# Patient Record
Sex: Male | Born: 1955 | Race: White | Hispanic: No | Marital: Married | Smoking: Never smoker
Health system: Southern US, Community
[De-identification: ages and names within clinical notes are randomized; demographics above are authoritative.]

## PROBLEM LIST (undated history)

## (undated) DIAGNOSIS — J189 Pneumonia, unspecified organism: Secondary | ICD-10-CM

## (undated) DIAGNOSIS — Z8719 Personal history of other diseases of the digestive system: Secondary | ICD-10-CM

## (undated) DIAGNOSIS — I10 Essential (primary) hypertension: Secondary | ICD-10-CM

## (undated) DIAGNOSIS — K219 Gastro-esophageal reflux disease without esophagitis: Secondary | ICD-10-CM

## (undated) DIAGNOSIS — T7840XA Allergy, unspecified, initial encounter: Secondary | ICD-10-CM

## (undated) DIAGNOSIS — K449 Diaphragmatic hernia without obstruction or gangrene: Secondary | ICD-10-CM

## (undated) DIAGNOSIS — M1990 Unspecified osteoarthritis, unspecified site: Secondary | ICD-10-CM

## (undated) DIAGNOSIS — Z9889 Other specified postprocedural states: Secondary | ICD-10-CM

## (undated) DIAGNOSIS — K589 Irritable bowel syndrome without diarrhea: Secondary | ICD-10-CM

## (undated) HISTORY — PX: SPINE SURGERY: SHX786

## (undated) HISTORY — PX: LAPAROSCOPIC CHOLECYSTECTOMY: SUR755

## (undated) HISTORY — DX: Allergy, unspecified, initial encounter: T78.40XA

## (undated) HISTORY — DX: Irritable bowel syndrome, unspecified: K58.9

## (undated) HISTORY — DX: Other specified postprocedural states: Z98.890

## (undated) HISTORY — DX: Diaphragmatic hernia without obstruction or gangrene: K44.9

## (undated) HISTORY — DX: Personal history of other diseases of the digestive system: Z87.19

---
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
## 1998-04-24 ENCOUNTER — Encounter: Admission: RE | Admit: 1998-04-24 | Discharge: 1998-07-23 | Payer: Self-pay | Admitting: Family Medicine

## 1998-04-29 ENCOUNTER — Ambulatory Visit (HOSPITAL_COMMUNITY): Admission: RE | Admit: 1998-04-29 | Discharge: 1998-04-29 | Payer: Self-pay

## 1998-09-20 ENCOUNTER — Emergency Department (HOSPITAL_COMMUNITY): Admission: EM | Admit: 1998-09-20 | Discharge: 1998-09-21 | Payer: Self-pay | Admitting: Emergency Medicine

## 1999-10-14 ENCOUNTER — Emergency Department (HOSPITAL_COMMUNITY): Admission: EM | Admit: 1999-10-14 | Discharge: 1999-10-14 | Payer: Self-pay | Admitting: Emergency Medicine

## 2001-05-19 ENCOUNTER — Emergency Department (HOSPITAL_COMMUNITY): Admission: EM | Admit: 2001-05-19 | Discharge: 2001-05-19 | Payer: Self-pay | Admitting: Emergency Medicine

## 2008-04-18 ENCOUNTER — Emergency Department (HOSPITAL_COMMUNITY): Admission: EM | Admit: 2008-04-18 | Discharge: 2008-04-18 | Payer: Self-pay | Admitting: *Deleted

## 2008-04-20 ENCOUNTER — Emergency Department (HOSPITAL_COMMUNITY): Admission: EM | Admit: 2008-04-20 | Discharge: 2008-04-20 | Payer: Self-pay | Admitting: Emergency Medicine

## 2008-05-08 ENCOUNTER — Ambulatory Visit: Payer: Self-pay | Admitting: Pulmonary Disease

## 2008-05-08 DIAGNOSIS — R05 Cough: Secondary | ICD-10-CM | POA: Insufficient documentation

## 2008-05-08 DIAGNOSIS — J302 Other seasonal allergic rhinitis: Secondary | ICD-10-CM

## 2008-05-08 DIAGNOSIS — J3089 Other allergic rhinitis: Secondary | ICD-10-CM | POA: Insufficient documentation

## 2008-06-02 ENCOUNTER — Ambulatory Visit: Payer: Self-pay | Admitting: Pulmonary Disease

## 2008-06-02 DIAGNOSIS — K219 Gastro-esophageal reflux disease without esophagitis: Secondary | ICD-10-CM

## 2008-06-03 ENCOUNTER — Telehealth: Payer: Self-pay | Admitting: Pulmonary Disease

## 2008-06-04 ENCOUNTER — Emergency Department (HOSPITAL_COMMUNITY): Admission: EM | Admit: 2008-06-04 | Discharge: 2008-06-04 | Payer: Self-pay | Admitting: Emergency Medicine

## 2008-06-19 ENCOUNTER — Ambulatory Visit: Payer: Self-pay | Admitting: Pulmonary Disease

## 2008-06-19 ENCOUNTER — Telehealth: Payer: Self-pay | Admitting: Pulmonary Disease

## 2008-06-23 ENCOUNTER — Encounter: Payer: Self-pay | Admitting: Pulmonary Disease

## 2008-06-23 ENCOUNTER — Ambulatory Visit: Payer: Self-pay | Admitting: Cardiovascular Disease

## 2009-05-22 IMAGING — CR DG CHEST 2V
2 series · 2 of 2 positions shown · non-contrast
Comparison: 04/20/2008

CLINICAL DATA: Cough.  Short of breath.

CHEST - 2 VIEW

[w chest pa]
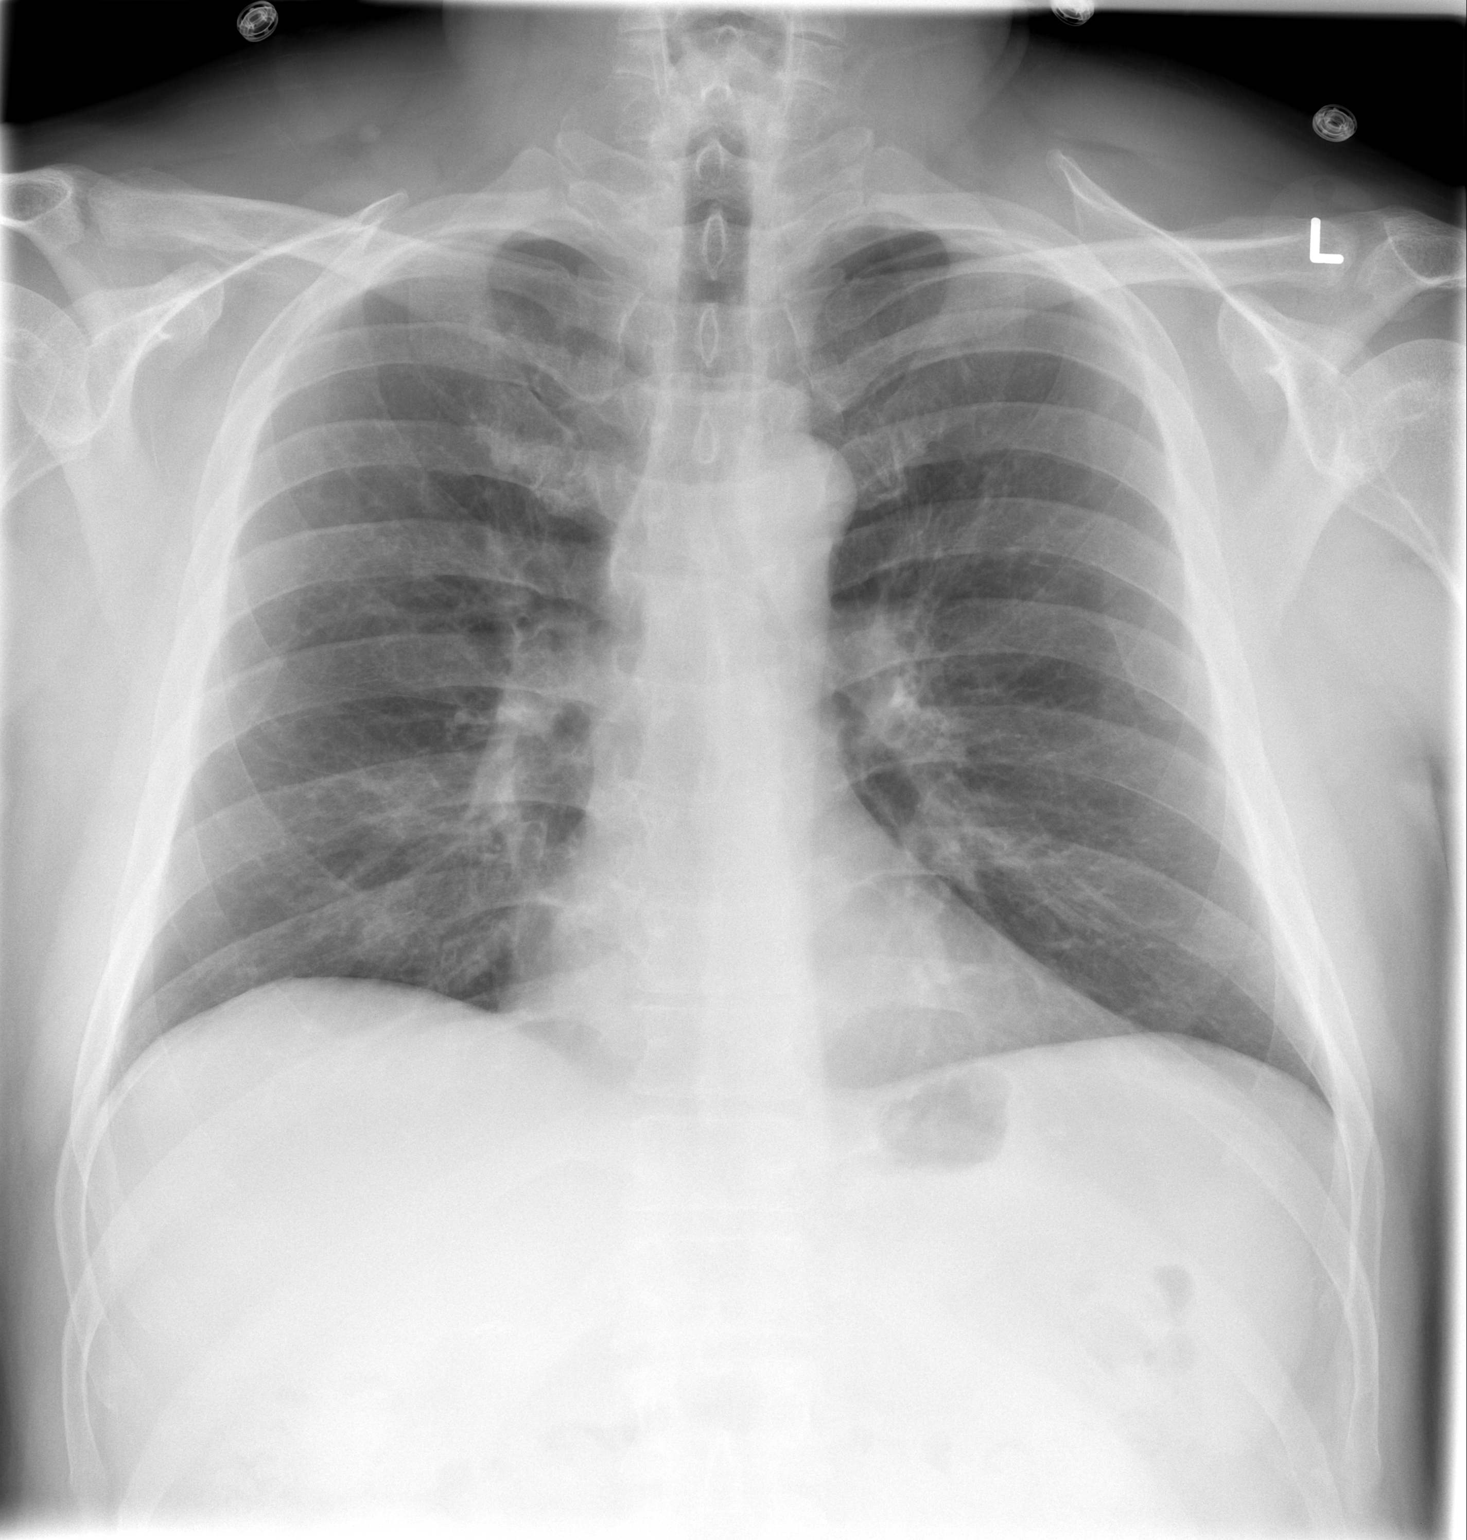

[w chest lat]
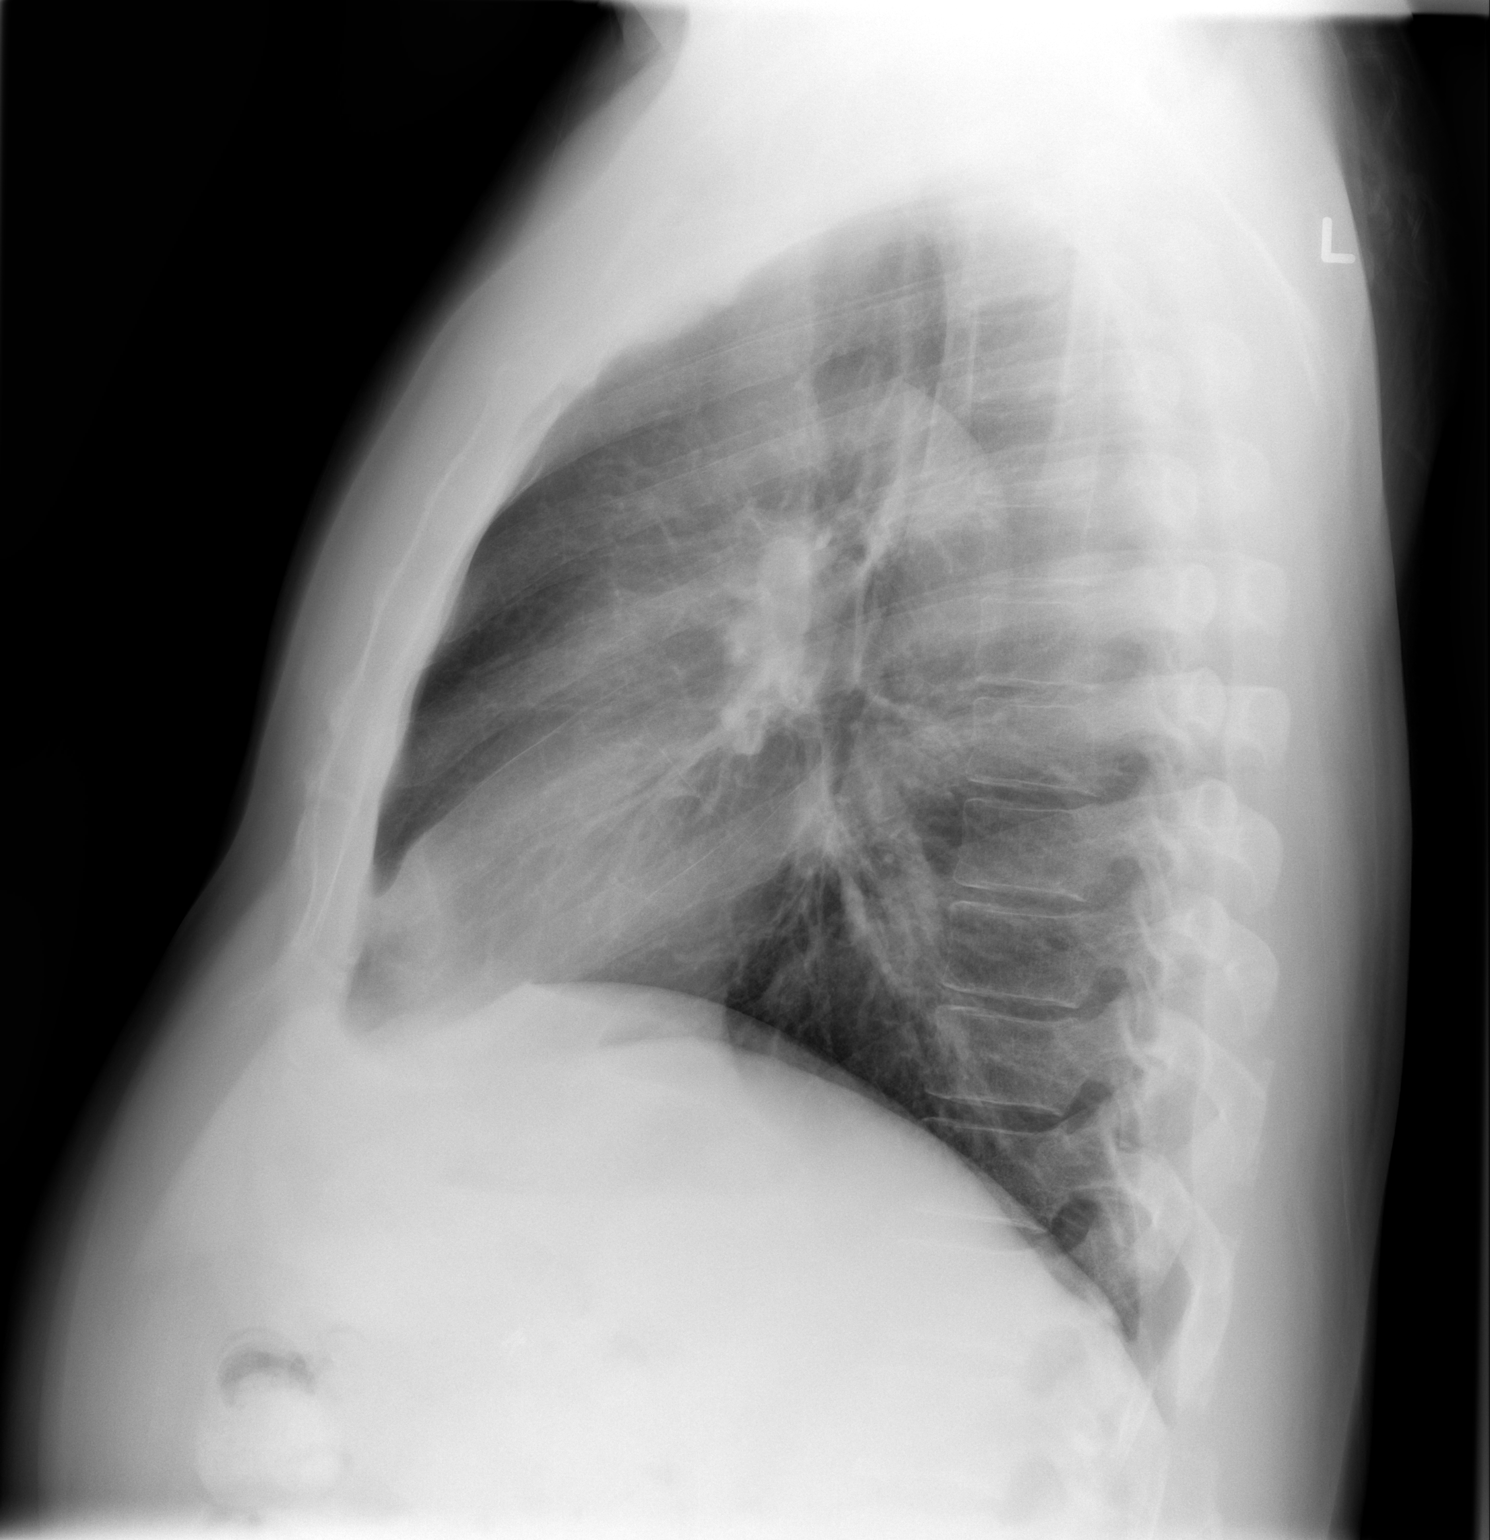

[2 of 2 positions shown; findings below may reference images not displayed]

FINDINGS: Mild right basilar atelectasis or scarring.  The
cardiopericardial silhouette appears within normal limits.
Mediastinal contours are unremarkable.  Trachea midline.  No
airspace disease or effusion.  Compared to the prior chest
radiograph, no interval change.
IMPRESSION: No acute cardiopulmonary disease.  Unchanged appearance of the
chest.

## 2010-05-27 LAB — POCT I-STAT, CHEM 8
BUN: 9 mg/dL (ref 6–23)
Chloride: 105 mEq/L (ref 96–112)
Creatinine, Ser: 1.2 mg/dL (ref 0.4–1.5)
Glucose, Bld: 121 mg/dL — ABNORMAL HIGH (ref 70–99)
Potassium: 4.1 mEq/L (ref 3.5–5.1)

## 2010-07-22 ENCOUNTER — Emergency Department (HOSPITAL_COMMUNITY)
Admission: EM | Admit: 2010-07-22 | Discharge: 2010-07-22 | Disposition: A | Discharge status: Home / Self Care | Payer: BC Managed Care – PPO | Attending: Emergency Medicine | Admitting: Emergency Medicine

## 2010-07-22 ENCOUNTER — Emergency Department (HOSPITAL_COMMUNITY): Payer: BC Managed Care – PPO

## 2010-07-22 DIAGNOSIS — R1110 Vomiting, unspecified: Secondary | ICD-10-CM | POA: Insufficient documentation

## 2010-07-22 DIAGNOSIS — R071 Chest pain on breathing: Secondary | ICD-10-CM | POA: Insufficient documentation

## 2010-07-22 DIAGNOSIS — M549 Dorsalgia, unspecified: Secondary | ICD-10-CM | POA: Insufficient documentation

## 2010-07-22 DIAGNOSIS — M542 Cervicalgia: Secondary | ICD-10-CM | POA: Insufficient documentation

## 2010-07-22 DIAGNOSIS — K219 Gastro-esophageal reflux disease without esophagitis: Secondary | ICD-10-CM | POA: Insufficient documentation

## 2010-07-22 DIAGNOSIS — R079 Chest pain, unspecified: Secondary | ICD-10-CM | POA: Insufficient documentation

## 2010-07-22 DIAGNOSIS — M79609 Pain in unspecified limb: Secondary | ICD-10-CM | POA: Insufficient documentation

## 2010-07-22 LAB — COMPREHENSIVE METABOLIC PANEL
AST: 26 U/L (ref 0–37)
Albumin: 4.4 g/dL (ref 3.5–5.2)
Chloride: 101 mEq/L (ref 96–112)
Creatinine, Ser: 1.21 mg/dL (ref 0.4–1.5)
GFR calc Af Amer: >60 mL/min (ref >60)
Potassium: 3.7 mEq/L (ref 3.5–5.1)
Total Bilirubin: 0.5 mg/dL (ref 0.3–1.2)

## 2010-07-22 LAB — CBC
HCT: 46.8 % (ref 39.0–52.0)
Hemoglobin: 16.9 g/dL (ref 13.0–17.0)
MCHC: 36.1 g/dL — ABNORMAL HIGH (ref 30.0–36.0)
MCV: 86 fL (ref 78.0–100.0)
RDW: 12.4 % (ref 11.5–15.5)

## 2010-07-22 LAB — DIFFERENTIAL
Eosinophils Relative: 4 % (ref 0–5)
Lymphocytes Relative: 35 % (ref 12–46)
Lymphs Abs: 2.8 10*3/uL (ref 0.7–4.0)
Monocytes Absolute: 0.9 10*3/uL (ref 0.1–1.0)
Monocytes Relative: 11 % (ref 3–12)

## 2010-07-22 LAB — TROPONIN I: Troponin I: <0.3 ng/mL (ref <0.30)

## 2011-04-19 ENCOUNTER — Emergency Department (HOSPITAL_COMMUNITY)
Admission: EM | Admit: 2011-04-19 | Discharge: 2011-04-19 | Disposition: A | Discharge status: Home / Self Care | Payer: BC Managed Care – PPO | Attending: Emergency Medicine | Admitting: Emergency Medicine

## 2011-04-19 ENCOUNTER — Emergency Department (HOSPITAL_COMMUNITY): Payer: BC Managed Care – PPO

## 2011-04-19 ENCOUNTER — Encounter (HOSPITAL_COMMUNITY): Payer: Self-pay | Admitting: *Deleted

## 2011-04-19 DIAGNOSIS — R110 Nausea: Secondary | ICD-10-CM | POA: Insufficient documentation

## 2011-04-19 DIAGNOSIS — H8110 Benign paroxysmal vertigo, unspecified ear: Secondary | ICD-10-CM

## 2011-04-19 DIAGNOSIS — R42 Dizziness and giddiness: Secondary | ICD-10-CM | POA: Insufficient documentation

## 2011-04-19 DIAGNOSIS — K219 Gastro-esophageal reflux disease without esophagitis: Secondary | ICD-10-CM | POA: Insufficient documentation

## 2011-04-19 DIAGNOSIS — R51 Headache: Secondary | ICD-10-CM

## 2011-04-19 DIAGNOSIS — I10 Essential (primary) hypertension: Secondary | ICD-10-CM | POA: Insufficient documentation

## 2011-04-19 HISTORY — DX: Essential (primary) hypertension: I10

## 2011-04-19 HISTORY — DX: Gastro-esophageal reflux disease without esophagitis: K21.9

## 2011-04-19 LAB — BASIC METABOLIC PANEL
CO2: 26 mEq/L (ref 19–32)
Calcium: 10 mg/dL (ref 8.4–10.5)
Creatinine, Ser: 1.12 mg/dL (ref 0.50–1.35)
Glucose, Bld: 88 mg/dL (ref 70–99)

## 2011-04-19 LAB — CBC
MCH: 31.1 pg (ref 26.0–34.0)
MCV: 86.1 fL (ref 78.0–100.0)
Platelets: 264 10*3/uL (ref 150–400)
RBC: 5.7 MIL/uL (ref 4.22–5.81)

## 2011-04-19 MED ORDER — SODIUM CHLORIDE 0.9 % IV BOLUS (SEPSIS)
1000.0000 mL | Freq: Once | INTRAVENOUS | Status: AC
  Administered 2011-04-19: 1000 mL via INTRAVENOUS

## 2011-04-19 MED ORDER — MECLIZINE HCL 25 MG PO TABS
25.0000 mg | ORAL_TABLET | Freq: Once | ORAL | Status: AC
  Administered 2011-04-19: 25 mg via ORAL
  Filled 2011-04-19: qty 1

## 2011-04-19 MED ORDER — KETOROLAC TROMETHAMINE 30 MG/ML IJ SOLN
30.0000 mg | Freq: Once | INTRAMUSCULAR | Status: AC
Start: 2011-04-19 — End: 2011-04-19
  Administered 2011-04-19: 30 mg via INTRAVENOUS
  Filled 2011-04-19: qty 1

## 2011-04-19 MED ORDER — DEXAMETHASONE SODIUM PHOSPHATE 10 MG/ML IJ SOLN
10.0000 mg | Freq: Once | INTRAMUSCULAR | Status: AC
  Administered 2011-04-19: 10 mg via INTRAVENOUS
  Filled 2011-04-19: qty 1

## 2011-04-19 MED ORDER — DIPHENHYDRAMINE HCL 50 MG/ML IJ SOLN
25.0000 mg | Freq: Once | INTRAMUSCULAR | Status: AC
  Administered 2011-04-19: 25 mg via INTRAVENOUS
  Filled 2011-04-19: qty 1

## 2011-04-19 MED ORDER — MECLIZINE HCL 50 MG PO TABS: 50.0000 mg | ORAL_TABLET | Freq: Three times a day (TID) | ORAL | Status: AC | PRN

## 2011-04-19 MED ORDER — METOCLOPRAMIDE HCL 5 MG/ML IJ SOLN
10.0000 mg | Freq: Once | INTRAMUSCULAR | Status: AC
  Administered 2011-04-19: 10 mg via INTRAVENOUS
  Filled 2011-04-19: qty 2

## 2011-04-19 NOTE — ED Notes (Signed)
Pt reports intermittent episodes of dizziness, states especially with driving. Pt reports intermittent headaches and nausea. Pt reports in the last 3 days the headaches and nausea have gotten worse. Stroke scale negative. cbg 94 at urgent care. Pt sent over for further eval.

## 2011-04-19 NOTE — ED Provider Notes (Signed)
History     CSN: [id number]  Arrival date & time 04/19/11  1240   First MD Initiated Contact with Patient 04/19/11 1611      Chief Complaint  Patient presents with  . Dizziness    (Consider location/radiation/quality/duration/timing/severity/associated sxs/prior treatment) HPI Comments: Patient presents today complaining of some intermittent dizziness and headache.  Patient notes that over the last 3-4 weeks he's had intermittent episodes of dizziness which at times he can described as a spinning sensation but occasionally he feels like he might pass out although he has not had any episodes where he's lost consciousness.  He does note that he specifically has noted these episodes when he's driving in the car when he has to turn his head to look at another lane.  He has not noted any fevers.  Over the last 3-4 days he's noted a dull achy headache in the right posterior aspect of his head coming along the temporal region.  He has not had a past history of migraines.  He's been taking Tylenol for the headache.  Last night he had poor sleep and similar symptoms with some associated nausea but did not come in.  Today he went to the pharmacy to get Tylenol and checked his blood pressure that was mildly elevated.  He does not currently take blood pressure medications but hasn't passed.  He called his doctor and he recommended he go to the urgent care.  He was seen at urgent care and instructed to come here for further evaluation.  Patient does not know changes in the headache with change of positions.  The headache is not worse in the morning.  Patient does have some concern because he has had 2 friends diagnosed with brain cancers in the last year.  Patient is a 56 y.o. male presenting with headaches. The history is provided by the patient. No language interpreter was used.  Headache  This is a new problem. The current episode started more than 2 days ago. The problem occurs constantly. The problem has  not changed since onset.The headache is associated with nothing. The pain is located in the right unilateral region. The quality of the pain is described as dull. The pain is moderate. Associated symptoms include nausea. Pertinent negatives include no anorexia, no fever, no malaise/fatigue, no chest pressure, no near-syncope, no orthopnea, no palpitations, no syncope, no shortness of breath and no vomiting.    Past Medical History  Diagnosis Date  . Hypertension   . GERD (gastroesophageal reflux disease)     Past Surgical History  Procedure Date  . Cholecystectomy     History reviewed. No pertinent family history.  History  Substance Use Topics  . Smoking status: Never Smoker   . Smokeless tobacco: Not on file  . Alcohol Use: Yes      Review of Systems  Constitutional: Negative.  Negative for fever, chills and malaise/fatigue.  Eyes: Negative.  Negative for discharge and redness.  Respiratory: Negative.  Negative for cough and shortness of breath.   Cardiovascular: Negative.  Negative for chest pain, palpitations, orthopnea, syncope and near-syncope.  Gastrointestinal: Positive for nausea. Negative for vomiting, abdominal pain and anorexia.  Genitourinary: Negative.  Negative for hematuria.  Musculoskeletal: Negative.  Negative for back pain and gait problem.  Skin: Negative.  Negative for color change and rash.  Neurological: Positive for dizziness, light-headedness and headaches. Negative for tremors, syncope, facial asymmetry, speech difficulty, weakness and numbness.  Hematological: Negative.  Negative for adenopathy.  Psychiatric/Behavioral: Negative.  Negative for confusion.  All other systems reviewed and are negative.    Allergies  Review of patient's allergies indicates no known allergies.  Home Medications   Current Outpatient Rx  Name Route Sig Dispense Refill  . ADULT MULTIVITAMIN W/MINERALS CH Oral Take 1 tablet by mouth daily.    .  OXYCODONE-ACETAMINOPHEN 5-325 MG PO TABS Oral Take 1 tablet by mouth every 8 (eight) hours as needed. For pain      BP 141/103  Pulse 87  Temp(Src) 98.4 F (36.9 C) (Oral)  Resp 18  SpO2 98%  Physical Exam  Nursing note and vitals reviewed. Constitutional: He is oriented to person, place, and time. He appears well-developed and well-nourished.  Non-toxic appearance. He does not have a sickly appearance.  HENT:  Head: Normocephalic and atraumatic.  Eyes: Conjunctivae, EOM and lids are normal. Pupils are equal, round, and reactive to light.  Neck: Trachea normal, normal range of motion and full passive range of motion without pain. Neck supple.  Cardiovascular: Normal rate, regular rhythm and normal heart sounds.  Exam reveals no gallop and no friction rub.   No murmur heard. Pulmonary/Chest: Effort normal and breath sounds normal. No respiratory distress. He has no wheezes. He has no rales.  Abdominal: Soft. Normal appearance. He exhibits no distension. There is no tenderness. There is no rebound and no CVA tenderness.  Musculoskeletal: Normal range of motion.  Neurological: He is alert and oriented to person, place, and time. He has normal strength.       Cranial nerves II through XII are intact.  Face is symmetric.  Speech is clear.  Tongue is midline.  Visual fields are intact.  Extraocular eye movements are intact.  Patient has normal finger to nose testing and heel-to-shin testing bilaterally.  Normal gait.  Symmetric strength in his upper extremities and lower extremities.  Sensation to light touch is intact.  No pronator drift on exam.  Skin: Skin is warm, dry and intact. No rash noted.  Psychiatric: He has a normal mood and affect. His behavior is normal. Judgment and thought content normal.    ED Course  Procedures (including critical care time)  Results for orders placed during the hospital encounter of 04/19/11  CBC      Component Value Range   WBC 11.2 (*) 4.0 - 10.5  (K/uL)   RBC 5.70  4.22 - 5.81 (MIL/uL)   Hemoglobin 17.7 (*) 13.0 - 17.0 (g/dL)   HCT 16.1  09.6 - 04.5 (%)   MCV 86.1  78.0 - 100.0 (fL)   MCH 31.1  26.0 - 34.0 (pg)   MCHC 36.0  30.0 - 36.0 (g/dL)   RDW 40.9  81.1 - 91.4 (%)   Platelets 264  150 - 400 (K/uL)  BASIC METABOLIC PANEL      Component Value Range   Sodium 139  135 - 145 (mEq/L)   Potassium 4.1  3.5 - 5.1 (mEq/L)   Chloride 103  96 - 112 (mEq/L)   CO2 26  19 - 32 (mEq/L)   Glucose, Bld 88  70 - 99 (mg/dL)   BUN 12  6 - 23 (mg/dL)   Creatinine, Ser 7.82  0.50 - 1.35 (mg/dL)   Calcium 95.6  8.4 - 10.5 (mg/dL)   GFR calc non Af Amer 73 (*) >90 (mL/min)   GFR calc Af Amer 85 (*) >90 (mL/min)   Ct Head Wo Contrast  04/19/2011  *RADIOLOGY REPORT*  Clinical Data: Dizziness, headache, and nausea.  CT HEAD WITHOUT CONTRAST  Technique:  Contiguous axial images were obtained from the base of the skull through the vertex without contrast.  Comparison: None.  Findings: There is no acute intracranial hemorrhage, infarction, or mass lesion.  Brain parenchyma is normal.  No osseous abnormality. Scalp calcification high over the left parietal bone.  IMPRESSION: No significant abnormalities.  Original Report Authenticated By: Gwynn Burly, M.D.      MDM  Patient with normal neurologic exam at this time.  Given the changes in his dizziness with head turning this appears to be a positional vertigo sensation at this time.  Patient has no specific risk factors to suggest that this would be a posterior circulation insufficiency.  Patient has a normal head CT at this time and does not have signs of brain mass or other abnormal bleeding.  Patient's symptoms of headache or not consistent with sudden onset to suggest subarachnoid hemorrhage.  He has not had fever or other infectious symptoms to suggest meningitis at this time.  I will attempt to treat the patient's headache with pain medications here and send him home with meclizine for the  vertigo.  I will have him followup with his primary care physician or an ear nose and throat specialist for further evaluation of this process.        Nat Christen, MD 04/19/11 [phone]

## 2011-04-19 NOTE — Discharge Instructions (Signed)
Benign Positional Vertigo Vertigo means you feel like you or your surroundings are moving when they are not. Benign positional vertigo is the most common form of vertigo. Benign means that the cause of your condition is not serious. Benign positional vertigo is more common in older adults. CAUSES  Benign positional vertigo is the result of an upset in the labyrinth system. This is an area in the middle ear that helps control your balance. This may be caused by a viral infection, head injury, or repetitive motion. However, often no specific cause is found. SYMPTOMS  Symptoms of benign positional vertigo occur when you move your head or eyes in different directions. Some of the symptoms may include:  Loss of balance and falls.   Vomiting.   Blurred vision.   Dizziness.   Nausea.   Involuntary eye movements (nystagmus).  DIAGNOSIS  Benign positional vertigo is usually diagnosed by physical exam. If the specific cause of your benign positional vertigo is unknown, your caregiver may perform imaging tests, such as magnetic resonance imaging (MRI) or computed tomography (CT). TREATMENT  Your caregiver may recommend movements or procedures to correct the benign positional vertigo. Medicines such as meclizine, benzodiazepines, and medicines for nausea may be used to treat your symptoms. In rare cases, if your symptoms are caused by certain conditions that affect the inner ear, you may need surgery. HOME CARE INSTRUCTIONS   Follow your caregiver's instructions.   Move slowly. Do not make sudden body or head movements.   Avoid driving.   Avoid operating heavy machinery.   Avoid performing any tasks that would be dangerous to you or others during a vertigo episode.   Drink enough fluids to keep your urine clear or pale yellow.  SEEK IMMEDIATE MEDICAL CARE IF:   You develop problems with walking, weakness, numbness, or using your arms, hands, or legs.   You have difficulty speaking.   You  develop severe headaches.   Your nausea or vomiting continues or gets worse.   You develop visual changes.   Your family or friends notice any behavioral changes.   Your condition gets worse.   You have a fever.   You develop a stiff neck or sensitivity to light.  MAKE SURE YOU:   Understand these instructions.   Will watch your condition.   Will get help right away if you are not doing well or get worse.  Document Released: 11/08/2005 Document Revised: 01/20/2011 Document Reviewed: 10/21/2010 [facility] Patient Information 2012 [place], [place].  Headache, General, Unknown Cause The specific cause of your headache may not have been found today. There are many causes and types of headache. A few common ones are:  Tension headache.   Migraine.   Infections (examples: dental and sinus infections).   Bone and/or joint problems in the neck or jaw.   Depression.   Eye problems.  These headaches are not life threatening.  Headaches can sometimes be diagnosed by a patient history and a physical exam. Sometimes, lab and imaging studies (such as x-ray and/or CT scan) are used to rule out more serious problems. In some cases, a spinal tap (lumbar puncture) may be requested. There are many times when your exam and tests may be normal on the first visit even when there is a serious problem causing your headaches. Because of that, it is very important to follow up with your doctor or local clinic for further evaluation. FINDING OUT THE RESULTS OF TESTS  If a radiology test was performed, a  radiologist will review your results.   You will be contacted by the emergency department or your physician if any test results require a change in your treatment plan.   Not all test results may be available during your visit. If your test results are not back during the visit, make an appointment with your caregiver to find out the results. Do not assume everything is normal if you have not heard  from your caregiver or the medical facility. It is important for you to follow up on all of your test results.  HOME CARE INSTRUCTIONS   Keep follow-up appointments with your caregiver, or any specialist referral.   Only take over-the-counter or prescription medicines for pain, discomfort, or fever as directed by your caregiver.   Biofeedback, massage, or other relaxation techniques may be helpful.   Ice packs or heat applied to the head and neck can be used. Do this three to four times per day, or as needed.   Call your doctor if you have any questions or concerns.   If you smoke, you should quit.  SEEK MEDICAL CARE IF:   You develop problems with medications prescribed.   You do not respond to or obtain relief from medications.   You have a change from the usual headache.   You develop nausea or vomiting.  SEEK IMMEDIATE MEDICAL CARE IF:   If your headache becomes severe.   You have an unexplained oral temperature above 102 F (38.9 C), or as your caregiver suggests.   You have a stiff neck.   You have loss of vision.   You have muscular weakness.   You have loss of muscular control.   You develop severe symptoms different from your first symptoms.   You start losing your balance or have trouble walking.   You feel faint or pass out.  MAKE SURE YOU:   Understand these instructions.   Will watch your condition.   Will get help right away if you are not doing well or get worse.  Document Released: 01/31/2005 Document Revised: 01/20/2011 Document Reviewed: 09/20/2007 [facility] Patient Information 2012 [place], [place].

## 2013-06-21 ENCOUNTER — Emergency Department (HOSPITAL_COMMUNITY): Payer: Worker's Compensation

## 2013-06-21 ENCOUNTER — Inpatient Hospital Stay (HOSPITAL_COMMUNITY)
Admission: EM | Admit: 2013-06-21 | Discharge: 2013-06-27 | DRG: 520 | Disposition: A | Discharge status: Home / Self Care | Payer: Worker's Compensation | Attending: Orthopedic Surgery | Admitting: Orthopedic Surgery

## 2013-06-21 ENCOUNTER — Encounter (HOSPITAL_COMMUNITY): Payer: Self-pay | Admitting: Emergency Medicine

## 2013-06-21 DIAGNOSIS — T4275XA Adverse effect of unspecified antiepileptic and sedative-hypnotic drugs, initial encounter: Secondary | ICD-10-CM | POA: Diagnosis present

## 2013-06-21 DIAGNOSIS — K5900 Constipation, unspecified: Secondary | ICD-10-CM | POA: Diagnosis present

## 2013-06-21 DIAGNOSIS — M5137 Other intervertebral disc degeneration, lumbosacral region: Principal | ICD-10-CM | POA: Diagnosis present

## 2013-06-21 DIAGNOSIS — I10 Essential (primary) hypertension: Secondary | ICD-10-CM | POA: Diagnosis present

## 2013-06-21 DIAGNOSIS — Z79899 Other long term (current) drug therapy: Secondary | ICD-10-CM

## 2013-06-21 DIAGNOSIS — M545 Low back pain, unspecified: Secondary | ICD-10-CM | POA: Diagnosis present

## 2013-06-21 DIAGNOSIS — M5126 Other intervertebral disc displacement, lumbar region: Secondary | ICD-10-CM | POA: Diagnosis present

## 2013-06-21 DIAGNOSIS — M47817 Spondylosis without myelopathy or radiculopathy, lumbosacral region: Secondary | ICD-10-CM | POA: Diagnosis present

## 2013-06-21 DIAGNOSIS — Z9889 Other specified postprocedural states: Secondary | ICD-10-CM | POA: Diagnosis present

## 2013-06-21 DIAGNOSIS — Z87891 Personal history of nicotine dependence: Secondary | ICD-10-CM

## 2013-06-21 DIAGNOSIS — K219 Gastro-esophageal reflux disease without esophagitis: Secondary | ICD-10-CM | POA: Diagnosis present

## 2013-06-21 DIAGNOSIS — M51379 Other intervertebral disc degeneration, lumbosacral region without mention of lumbar back pain or lower extremity pain: Principal | ICD-10-CM | POA: Diagnosis present

## 2013-06-21 DIAGNOSIS — M5410 Radiculopathy, site unspecified: Secondary | ICD-10-CM | POA: Diagnosis present

## 2013-06-21 DIAGNOSIS — M5459 Other low back pain: Secondary | ICD-10-CM

## 2013-06-21 DIAGNOSIS — M549 Dorsalgia, unspecified: Secondary | ICD-10-CM

## 2013-06-21 HISTORY — DX: Unspecified osteoarthritis, unspecified site: M19.90

## 2013-06-21 HISTORY — DX: Pneumonia, unspecified organism: J18.9

## 2013-06-21 LAB — CBC WITH DIFFERENTIAL/PLATELET
Basophils Absolute: 0 10*3/uL (ref 0.0–0.1)
Basophils Relative: 0 % (ref 0–1)
Eosinophils Absolute: 0 10*3/uL (ref 0.0–0.7)
Eosinophils Relative: 0 % (ref 0–5)
HEMATOCRIT: 45.8 % (ref 39.0–52.0)
HEMOGLOBIN: 15.7 g/dL (ref 13.0–17.0)
LYMPHS ABS: 1 10*3/uL (ref 0.7–4.0)
LYMPHS PCT: 8 % — AB (ref 12–46)
MCH: 30.6 pg (ref 26.0–34.0)
MCHC: 34.3 g/dL (ref 30.0–36.0)
MCV: 89.3 fL (ref 78.0–100.0)
MONO ABS: 0.1 10*3/uL (ref 0.1–1.0)
MONOS PCT: 1 % — AB (ref 3–12)
NEUTROS ABS: 11.2 10*3/uL — AB (ref 1.7–7.7)
NEUTROS PCT: 91 % — AB (ref 43–77)
Platelets: 240 10*3/uL (ref 150–400)
RBC: 5.13 MIL/uL (ref 4.22–5.81)
RDW: 12.9 % (ref 11.5–15.5)
WBC: 12.3 10*3/uL — ABNORMAL HIGH (ref 4.0–10.5)

## 2013-06-21 LAB — I-STAT CHEM 8, ED
BUN: 14 mg/dL (ref 6–23)
CALCIUM ION: 1.2 mmol/L (ref 1.12–1.23)
CHLORIDE: 104 meq/L (ref 96–112)
CREATININE: 1.1 mg/dL (ref 0.50–1.35)
GLUCOSE: 153 mg/dL — AB (ref 70–99)
HEMATOCRIT: 48 % (ref 39.0–52.0)
Hemoglobin: 16.3 g/dL (ref 13.0–17.0)
POTASSIUM: 4.1 meq/L (ref 3.7–5.3)
Sodium: 143 mEq/L (ref 137–147)
TCO2: 21 mmol/L (ref 0–100)

## 2013-06-21 MED ORDER — SODIUM CHLORIDE 0.9 % IV SOLN: 250.0000 mL | INTRAVENOUS | Status: DC | PRN

## 2013-06-21 MED ORDER — PREDNISONE 10 MG PO TABS
10.0000 mg | ORAL_TABLET | Freq: Every day | ORAL | Status: AC
  Administered 2013-06-25: 10 mg via ORAL
  Filled 2013-06-21 (×2): qty 1

## 2013-06-21 MED ORDER — HYDROMORPHONE HCL PF 1 MG/ML IJ SOLN: 1.0000 mg | INTRAMUSCULAR | Status: DC | PRN

## 2013-06-21 MED ORDER — METAXALONE 400 MG HALF TABLET
400.0000 mg | ORAL_TABLET | Freq: Three times a day (TID) | ORAL | Status: DC
  Administered 2013-06-21 – 2013-06-25 (×13): 400 mg via ORAL
  Filled 2013-06-21 (×16): qty 1

## 2013-06-21 MED ORDER — ONDANSETRON HCL 4 MG/2ML IJ SOLN: 4.0000 mg | Freq: Three times a day (TID) | INTRAMUSCULAR | Status: AC | PRN

## 2013-06-21 MED ORDER — LISINOPRIL 10 MG PO TABS
10.0000 mg | ORAL_TABLET | Freq: Every day | ORAL | Status: DC
  Administered 2013-06-22 – 2013-06-27 (×6): 10 mg via ORAL
  Filled 2013-06-21 (×6): qty 1

## 2013-06-21 MED ORDER — PREDNISONE 10 MG PO TABS: 10.0000 mg | ORAL_TABLET | Freq: Every day | ORAL | Status: DC

## 2013-06-21 MED ORDER — PREDNISONE 20 MG PO TABS
30.0000 mg | ORAL_TABLET | Freq: Every day | ORAL | Status: AC
  Administered 2013-06-23: 30 mg via ORAL
  Filled 2013-06-21: qty 1

## 2013-06-21 MED ORDER — HYDROMORPHONE HCL PF 1 MG/ML IJ SOLN
1.0000 mg | Freq: Once | INTRAMUSCULAR | Status: AC
  Administered 2013-06-21: 1 mg via INTRAVENOUS
  Filled 2013-06-21: qty 1

## 2013-06-21 MED ORDER — SODIUM CHLORIDE 0.9 % IV SOLN
10.0000 mg | Freq: Once | INTRAVENOUS | Status: AC
  Administered 2013-06-21: 10 mg via INTRAVENOUS
  Filled 2013-06-21: qty 1

## 2013-06-21 MED ORDER — SODIUM CHLORIDE 0.9 % IJ SOLN
3.0000 mL | Freq: Two times a day (BID) | INTRAMUSCULAR | Status: DC
  Administered 2013-06-23 – 2013-06-25 (×7): 3 mL via INTRAVENOUS

## 2013-06-21 MED ORDER — OXYCODONE-ACETAMINOPHEN 5-325 MG PO TABS
1.0000 | ORAL_TABLET | Freq: Four times a day (QID) | ORAL | Status: DC | PRN
  Administered 2013-06-21 – 2013-06-25 (×10): 2 via ORAL
  Filled 2013-06-21 (×11): qty 2

## 2013-06-21 MED ORDER — PREDNISONE 20 MG PO TABS
20.0000 mg | ORAL_TABLET | Freq: Every day | ORAL | Status: AC
  Administered 2013-06-24: 20 mg via ORAL
  Filled 2013-06-21: qty 1

## 2013-06-21 MED ORDER — ESCITALOPRAM OXALATE 20 MG PO TABS
20.0000 mg | ORAL_TABLET | Freq: Every day | ORAL | Status: DC
  Administered 2013-06-21 – 2013-06-26 (×6): 20 mg via ORAL
  Filled 2013-06-21 (×7): qty 1

## 2013-06-21 MED ORDER — PREDNISONE 20 MG PO TABS
40.0000 mg | ORAL_TABLET | Freq: Every day | ORAL | Status: AC
Start: 2013-06-22 — End: 2013-06-22
  Administered 2013-06-22: 40 mg via ORAL
  Filled 2013-06-21: qty 2

## 2013-06-21 MED ORDER — ONDANSETRON HCL 4 MG/2ML IJ SOLN
4.0000 mg | Freq: Once | INTRAMUSCULAR | Status: AC
  Administered 2013-06-21: 4 mg via INTRAVENOUS
  Filled 2013-06-21: qty 2

## 2013-06-21 MED ORDER — ADULT MULTIVITAMIN W/MINERALS CH
1.0000 | ORAL_TABLET | Freq: Every day | ORAL | Status: DC
  Administered 2013-06-21 – 2013-06-27 (×7): 1 via ORAL
  Filled 2013-06-21 (×7): qty 1

## 2013-06-21 MED ORDER — SODIUM CHLORIDE 0.9 % IV SOLN
INTRAVENOUS | Status: DC
  Administered 2013-06-21: 14:00:00 via INTRAVENOUS

## 2013-06-21 MED ORDER — MORPHINE SULFATE 2 MG/ML IJ SOLN
2.0000 mg | INTRAMUSCULAR | Status: DC | PRN
  Administered 2013-06-21 – 2013-06-24 (×20): 2 mg via INTRAVENOUS
  Filled 2013-06-21 (×21): qty 1

## 2013-06-21 MED ORDER — SODIUM CHLORIDE 0.9 % IJ SOLN: 3.0000 mL | INTRAMUSCULAR | Status: DC | PRN

## 2013-06-21 MED ORDER — HYDROMORPHONE HCL PF 1 MG/ML IJ SOLN
1.0000 mg | Freq: Once | INTRAMUSCULAR | Status: AC
Start: 2013-06-21 — End: 2013-06-21
  Administered 2013-06-21: 1 mg via INTRAVENOUS
  Filled 2013-06-21: qty 1
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
## 2013-06-21 NOTE — ED Notes (Signed)
Dr. Knapp at the bedside.  

## 2013-06-21 NOTE — ED Notes (Signed)
Pt having severe back pain and unable to stand or lay. 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 sts he has been taking steroids and pain meds without relief. sts he cannot put pressure on right leg. Pt sees Dr. Berenice Primas.

## 2013-06-21 NOTE — ED Notes (Signed)
Dr. Tomi Bamberger given Phone number for [facility]. Made aware of the patient's situation.

## 2013-06-21 NOTE — Progress Notes (Signed)
Pt arrived to unit alert and oriented x4. Oriented to room, unit, and staff.  Bed in lowest position and call bell is within reach. Will continue to monitor. 

## 2013-06-21 NOTE — Progress Notes (Signed)
Called and received report from [place], [place].

## 2013-06-21 NOTE — H&P (Addendum)
[facility] History and Physical  Anthony Miller YIR:[id number] DOB: [birth year] DOA: 06/21/2013  Referring physician: ED physician PCP: No primary provider on file.   Chief Complaint: back pain  HPI: Anthony Miller is a 58 y.o. male   The patient is a 58 year old gentleman with past medical history of chronic lumbar spine stenosis, hypertension, GERD, who presents with acutely worsening back pain.  The patient has chronic lower back pain which has been controlled with pain medication at home. He also had PT and local steroid injection by sports medicine, Dr. Mina Marble in the past with good respond. He stated that he started having severe back pain after having lifted a heavy object 6 days ago. His back is mainly located at the right paraspinal area in the lower back, radiating down to his right upper thigh and groin area. It is dull and constant 7/10 in severity. The patient cannot stand on the right leg because of pain, but no decreased muscle strength. No urinary incontinence, or loss of control of bladder. No fever or chills.   He saw his orthopedist, Dr. Berenice Primas yesterday. He had x-rays done which he states showed a bulging disc. He also had some physical therapy yesterday without significant help. His back pain has been progressively getting worse, therefore came to ED for further evaluation and treatment. MRI done in ED showed moderate right foraminal stenosis at L4-5, but no spinal cord compression.   Review of Systems:  Constitutional:  No weight loss, night sweats, Fevers, chills, fatigue.  HEENT:  No headaches, Difficulty swallowing,Tooth/dental problems,Sore throat,  No sneezing, itching, ear ache, nasal congestion, post nasal drip,  Cardio-vascular:  No chest pain, Orthopnea, PND, swelling in lower extremities, anasarca, dizziness, palpitations  GI:  No heartburn, indigestion, abdominal pain, nausea, vomiting, diarrhea, change in bowel habits, loss of appetite  Resp:    No shortness of breath with exertion or at rest. No excess mucus, no productive cough, No non-productive cough, .No change in color of mucus. No wheezing. No chest wall deformity  Skin:  no rash or lesions.  GU:  no dysuria, change in color of urine, no urgency or frequency. No flank pain.  Musculoskeletal: has severe lower back pain, limited range of motion his back and right leg due to pain.  Psych:  No change in mood or affect. No depression or anxiety. No memory loss.   Past Medical History  Diagnosis Date  . Hypertension   . GERD (gastroesophageal reflux disease)    Past Surgical History  Procedure Laterality Date  . Cholecystectomy     Social History:  reports that he has never smoked. He does not have any smokeless tobacco history on file. He reports that he drinks alcohol. He reports that he does not use illicit drugs.  No Known Allergies  History reviewed. No pertinent family history.   Prior to Admission medications   Medication Sig Start Date End Date Taking? Authorizing Provider  escitalopram (LEXAPRO) 20 MG tablet Take 20 mg by mouth at bedtime.   Yes Historical Provider, MD  HYDROcodone-acetaminophen (NORCO/VICODIN) 5-325 MG per tablet Take 1-2 tablets by mouth every 6 (six) hours as needed for moderate pain.    Yes Historical Provider, MD  lisinopril (PRINIVIL,ZESTRIL) 10 MG tablet Take 10 mg by mouth daily.   Yes Historical Provider, MD  Multiple Vitamin (MULITIVITAMIN WITH MINERALS) TABS Take 1 tablet by mouth daily.   Yes Historical Provider, MD  Omega-3 Fatty Acids (FISH OIL PO) Take 1 capsule by  mouth 2 (two) times daily.   Yes Historical Provider, MD  predniSONE (DELTASONE) 10 MG tablet Take 10-60 mg by mouth daily. *patient on 6 day taper pak. Takes 60mg  day 1, then tapers 10mg  daily for 6 days until gone*   Yes Historical Provider, MD   Physical Exam: Filed Vitals:   06/21/13 2104  BP: 132/78  Pulse: 74  Temp: 99.3 F (37.4 C)  Resp: 18    BP 132/78   Pulse 74  Temp(Src) 99.3 F (37.4 C) (Oral)  Resp 18  Ht 6\' 1"  (1.854 m)  Wt 245 lb 8 oz (111.358 kg)  BMI 32.40 kg/m2  SpO2 94%  General:  In mildly acute distress due to pain Eyes: PERRL, normal lids, irises & conjunctiva ENT: grossly normal hearing, lips & tongue Neck: no LAD, masses or thyromegaly Cardiovascular: RRR, no m/r/g. No LE edema. Telemetry: SR, no arrhythmias  Respiratory: CTA bilaterally, no w/r/r. Normal respiratory effort. Abdomen: soft, ntnd Skin: no rash or induration seen on limited exam Musculoskeletal: tender over right paraspinal area in the lower back pain, with limited range of motion in his back and right leg due to pain.  Psychiatric: grossly normal mood and affect, speech fluent and appropriate Neurologic: grossly non-focal. Muscle strength is normal.           Labs on Admission:  Basic Metabolic Panel:  Recent Labs Lab 06/21/13 2003  NA 143  K 4.1  CL 104  GLUCOSE 153*  BUN 14  CREATININE 1.10   CBC:  Recent Labs Lab 06/21/13 1936 06/21/13 2003  WBC 12.3*  --   NEUTROABS 11.2*  --   HGB 15.7 16.3  HCT 45.8 48.0  MCV 89.3  --   PLT 240  --      Radiological Exams on Admission: Mr Lumbar Spine Wo Contrast  06/21/2013   CLINICAL DATA:  Low back pain  EXAM: MRI LUMBAR SPINE WITHOUT CONTRAST  TECHNIQUE: Multiplanar, multisequence MR imaging of the lumbar spine was performed. No intravenous contrast was administered.  COMPARISON:  None.  FINDINGS: The lowest lumbar type non-rib-bearing vertebra is labeled as L5. The conus medullaris appears normal. Conus level: L1-2.  Loss of disc height at the L2-3 level with type 2 degenerative endplate findings. Hemangioma noted eccentric to the right in the L3 vertebral body.  Possible Schmorl's node along the posterior superior endplate of L3. Hemangioma noted eccentric to the left in the L4 vertebral body.  Additional findings at individual levels are as follows:  L1-2: Unremarkable.  L2-3: Mild  central narrowing of the thecal sac mild right subarticular lateral recess stenosis due to right paracentral disc protrusion, disc bulge, and posterior osseous ridging.  L3-4:  Unremarkable.  L4-5: Moderate right foraminal stenosis due to right foraminal disc protrusion.  L5-S1:  Mild left foraminal stenosis due to facet spurring.  IMPRESSION: 1. 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 2. Spondylosis and degenerative disc disease.   Electronically Signed   By: Sherryl Barters M.D.   On: 06/21/2013 15:51    EKG: Independently reviewed.   Assessment/Plan Principal Problem:   Intractable low back pain Active Problems:   GERD   HTN (hypertension)  1. Intractable lower back pain: It is most likely due to the combination of paraspinal muscle straining and spinal cord stenosis.  No signs of infection. No spinal cord compression on MRI.   - will focused on pain control and anti-inflammation:  IV decadron,  morphine prn, and Percocet - muscle relaxor: Skelaxin  - encourage early movement - may switch steroid to NSAIDs after improving  2.  HTN: bp is stable. Will continue home med, lisinopril 10 mg daily.    Code Status: full coded Family Communication: wife was updated at bedside Disposition Plan: likely tomorrow if back pain improves significantly  Time spent: 40 min  Ivor Costa, MD PGY3, Internal Medicine Teaching Service Pager: [phone]  Pt seen and examined with above resident.  Agree with above.  Cont steroid taper started yesterday, received on dose of decadron in ED.  Place on muscle relaxer along with oxycodone.  Hydrocodone not working for him at home.  Also dilaudid caused some itching in the ED, switch to morphine for severe breakthrough pain.  If not better in am, IR will evaluate for possible lumbar injection.  No anticoagulants for that reason ordered.  obs on medical.  Full code.

## 2013-06-22 DIAGNOSIS — I10 Essential (primary) hypertension: Secondary | ICD-10-CM

## 2013-06-22 MED ORDER — ENOXAPARIN SODIUM 40 MG/0.4ML ~~LOC~~ SOLN
40.0000 mg | SUBCUTANEOUS | Status: DC
  Administered 2013-06-22: 40 mg via SUBCUTANEOUS
  Filled 2013-06-22 (×2): qty 0.4

## 2013-06-22 NOTE — Consult Note (Signed)
Reason for Consult: R leg px > LBP Referring Physician: Dr Anthony Miller is an 58 y.o. male.  HPI: We saw Anthony Miller in consultation today for 8 day hx of LBP and R leg px. He does have a history of this many years ago however hs pain was resolved after a lumbar steroid injection performed by our partner Dr. Mina Miller. He reports having lifted two very large pts over the past two weeks and developing severe LBP that progressed into severe R leg pain radiating into his hip and groin as well as upper thigh. He saw Dr Anthony Miller 06/20/13 in the office and was started on MDP. His px became so sever he could not bear weight through the leg or even sit upright without 10/10 R leg pain and he presented to the ED last night. An updated MRI was obtained and he was given decadron. We were consulted to f/u on pain and MRI results. Pt continues to report severe R groin and upper thigh pain unimproved with prednisone. He had to have a foley placed as he was unable to rise and use restroom on own secondary to leg pain. Denies loss of bowel or bladder control, denies saddle anesthesia, denies substantial numbness.  Pt is very appropriate and accompanied by wife and children.    Past Medical History  Diagnosis Date  . Hypertension   . GERD (gastroesophageal reflux disease)   . Pneumonia 1970's; ~ 2010  . Arthritis     "lower back/pelvic region" (06/21/2013)    Past Surgical History  Procedure Laterality Date  . Laparoscopic cholecystectomy  ~ 1999  . Tonsillectomy  1975    History reviewed. No pertinent family history.  Social History:  reports that he has never smoked. He has quit using smokeless tobacco. He reports that he drinks about 1.2 ounces of alcohol per week. He reports that he does not use illicit drugs.  Allergies: No Known Allergies  Current facility-administered medications:0.9 %  sodium chloride infusion, 250 mL, Intravenous, PRN, Anthony Grout, MD;  enoxaparin (LOVENOX) injection 40  mg, 40 mg, Subcutaneous, Q24H, Anthony Kristeen Mans, MD;  escitalopram (LEXAPRO) tablet 20 mg, 20 mg, Oral, QHS, Anthony Grout, MD, 20 mg at 06/21/13 2153;  lisinopril (PRINIVIL,ZESTRIL) tablet 10 mg, 10 mg, Oral, Daily, Anthony Grout, MD, 10 mg at 06/22/13 2694 metaxalone (SKELAXIN) tablet 400 mg, 400 mg, Oral, TID, Anthony Grout, MD, 400 mg at 06/22/13 0945;  morphine 2 MG/ML injection 2 mg, 2 mg, Intravenous, Q2H PRN, Anthony Grout, MD, 2 mg at 06/22/13 1026;  multivitamin with minerals tablet 1 tablet, 1 tablet, Oral, Daily, Anthony Grout, MD, 1 tablet at 06/22/13 0945 oxyCODONE-acetaminophen (PERCOCET/ROXICET) 5-325 MG per tablet 1-2 tablet, 1-2 tablet, Oral, Q6H PRN, Anthony Grout, MD, 2 tablet at 06/22/13 0437;  [START ON 06/25/2013] predniSONE (DELTASONE) tablet 10 mg, 10 mg, Oral, Q breakfast, Anthony Miller, RPH;  Derrill Memo ON 06/24/2013] predniSONE (DELTASONE) tablet 20 mg, 20 mg, Oral, Q breakfast, Anthony Miller, RPH [START ON 06/23/2013] predniSONE (DELTASONE) tablet 30 mg, 30 mg, Oral, Q breakfast, Anthony Miller, RPH;  sodium chloride 0.9 % injection 3 mL, 3 mL, Intravenous, Q12H, Anthony Grout, MD;  sodium chloride 0.9 % injection 3 mL, 3 mL, Intravenous, PRN, Anthony Grout, MD  Results for orders placed during the hospital encounter of 06/21/13 (from the past 48 hour(s))  CBC WITH DIFFERENTIAL     Status: Abnormal   Collection Time  06/21/13  7:36 PM      Result Value Ref Range   WBC 12.3 (*) 4.0 - 10.5 K/uL   RBC 5.13  4.22 - 5.81 MIL/uL   Hemoglobin 15.7  13.0 - 17.0 g/dL   HCT 45.8  39.0 - 52.0 %   MCV 89.3  78.0 - 100.0 fL   MCH 30.6  26.0 - 34.0 pg   MCHC 34.3  30.0 - 36.0 g/dL   RDW 12.9  11.5 - 15.5 %   Platelets 240  150 - 400 K/uL   Neutrophils Relative % 91 (*) 43 - 77 %   Neutro Abs 11.2 (*) 1.7 - 7.7 K/uL   Lymphocytes Relative 8 (*) 12 - 46 %   Lymphs Abs 1.0  0.7 - 4.0 K/uL   Monocytes Relative 1 (*) 3 - 12 %   Monocytes Absolute  0.1  0.1 - 1.0 K/uL   Eosinophils Relative 0  0 - 5 %   Eosinophils Absolute 0.0  0.0 - 0.7 K/uL   Basophils Relative 0  0 - 1 %   Basophils Absolute 0.0  0.0 - 0.1 K/uL  I-STAT CHEM 8, ED     Status: Abnormal   Collection Time    06/21/13  8:03 PM      Result Value Ref Range   Sodium 143  137 - 147 mEq/L   Potassium 4.1  3.7 - 5.3 mEq/L   Chloride 104  96 - 112 mEq/L   BUN 14  6 - 23 mg/dL   Creatinine, Ser 1.10  0.50 - 1.35 mg/dL   Glucose, Bld 153 (*) 70 - 99 mg/dL   Calcium, Ion 1.20  1.12 - 1.23 mmol/L   TCO2 21  0 - 100 mmol/L   Hemoglobin 16.3  13.0 - 17.0 g/dL   HCT 48.0  39.0 - 52.0 %    MRI Lumbar Spine: Reviewed images in office prior to arrival and again in hospital. Moderate DD L2-3 with R broad based disc protrusion and moderate R lateral recess stenosis with likely R L3 nerve root irritation. Levels above and below demonstrate no substantial neurologic compression.   Review of Systems  Constitutional: Negative.   Cardiovascular: Negative for leg swelling.  Gastrointestinal: Negative for nausea, vomiting, diarrhea and constipation.  Genitourinary: Negative for urgency and frequency.  Musculoskeletal: Positive for back pain. Negative for falls.  Skin: Negative.   Neurological: Positive for tingling and focal weakness. Negative for headaches.  Psychiatric/Behavioral: The patient is not nervous/anxious.    Blood pressure 117/75, pulse 74, temperature 97.5 F (36.4 C), temperature source Oral, resp. rate 18, height 6\' 1"  (1.854 m), weight 111.358 kg (245 lb 8 oz), SpO2 94.00%. Physical Exam  Constitutional: He is oriented to person, place, and time. He appears well-developed and well-nourished. He is cooperative. He appears distressed.  HENT:  Head: Normocephalic and atraumatic.  Eyes: EOM are normal.  Cardiovascular: Intact distal pulses.   Respiratory: Effort normal.  Musculoskeletal: He exhibits tenderness. He exhibits no edema.       Lumbar back: He exhibits  tenderness and spasm. He exhibits normal range of motion and no deformity.  Decreased ROM L spine, pt laying in hospital pain, increased R leg pain in groin and upper thing with any valsalva maneuver or sitting upright, pt cannot bear full weight through RLE secondary to increased pain. Strength 5/5 hip flx/knee ext, TA, EHL, PFLX however increased px with MMT. Moderate TTP L PSM R>L. NVI 2+ DPP, Cap refill <  2sec = BIL.   Neurological: He is alert and oriented to person, place, and time. He has normal reflexes. He displays no atrophy, no tremor and normal reflexes. He exhibits normal muscle tone. Gait abnormal.  Skin: Skin is warm and dry. No rash noted. He is not diaphoretic. No erythema.  Psychiatric: He has a normal mood and affect. His behavior is normal. Thought content normal.    Assessment/Plan: -R L3 Radiculopathy secondary to R L2-3 Lat recess stenosis progressive 8 day hx after lifting 2 large pts over past 2 weeks. Hx or prior episode >72yrs ago resolved with injection therapy -Moderate DDD L2-3  I did discuss with the pt his findings on MRI. He continues to have severe ongoing leg pain that corresponds with his MRI findings and is clearly very debilitating. While his symptoms are severe his MRI findings are moderate however the pt appears very legitimate and cooperative. We do not feel emergent surgical intervention is required as there is no cauda equina and no substantial weakness on exam however his pain levels are clearly too high to be released home at this venture as he finds it difficult to even sit up and cannot ambulate on RLE. We therefore are recommending a R L3 SNRB injection be performed and the pt release home after pending pain improvement with injection. Clearly PO prednisone and IV are not addressing his current pain and symtomatology and an injection is indicated. The pt does not feel he can tolerate release home and office f/u for injection at this time. We will order  injection and continue to monitor pt. If pain improves can be released home and f/u in office 2 weeks after injection.    Anthony Miller 06/22/2013, 1:29 PM

## 2013-06-22 NOTE — Evaluation (Signed)
Physical Therapy Evaluation Patient Details Name: Anthony Miller MRN: [id number] DOB: 06-25-1955 Today's Date: 06/22/2013   History of Present Illness   patient is a 58 year old gentleman with past medical history of chronic lumbar spine stenosis, hypertension, GERD, who presents with acutely worsening back pain.  Clinical Impression  Pt presents with severe pain which limits all mobility.  Pt with pain in back on R side and R groin pain. Pt unable to bear wt on R LE due to pain. Pt unable to tolerate gait or sitting OOB due to pain.  Pt will benefit from skilled PT services to address deficits, assess for equipment needs, and increase independence for d/c home.    Follow Up Recommendations Outpatient PT    Equipment Recommendations   (TBD)    Recommendations for Other Services OT consult     Precautions / Restrictions Restrictions Weight Bearing Restrictions: No      Mobility  Bed Mobility Overal bed mobility: Modified Independent             General bed mobility comments: uses bed rails and log roll technique  Transfers Overall transfer level: Needs assistance Equipment used: 1 person hand held assist Transfers: Sit to/from Stand Sit to Stand: Min guard         General transfer comment: min guard for balance as pt stands without wt bearing on R LE due to pain, pt unable to put R LE on floor during eval, pt able to stand only 20 seconds before pain became unbearable  Ambulation/Gait             General Gait Details: unable to attempt due to increased pain with standing only 20 seconds  Stairs            Wheelchair Mobility    Modified Rankin (Stroke Patients Only)       Balance                                             Pertinent Vitals/Pain Pt with 10/10 pain with mobility, eases with rest and repositioning    Home Living Family/patient expects to be discharged to:: Private residence Living Arrangements:  Spouse/significant other;Children Available Help at Discharge: Family Type of Home: House Home Access: Stairs to enter   Technical brewer of Steps: 3 Home Layout: Able to live on main level with bedroom/bathroom Home Equipment: None      Prior Function Level of Independence: Independent               Hand Dominance        Extremity/Trunk Assessment   Upper Extremity Assessment: Overall WFL for tasks assessed           Lower Extremity Assessment: Overall WFL for tasks assessed      Cervical / Trunk Assessment:  (limited trunk ROM due to pain)  Communication   Communication: No difficulties  Cognition Arousal/Alertness: Awake/alert Behavior During Therapy: WFL for tasks assessed/performed Overall Cognitive Status: Within Functional Limits for tasks assessed                      General Comments General comments (skin integrity, edema, etc.): pt unable to tolerate sitting or standing, is only comfortable in sidelying on L. pt states he was sleeping on the floor at home due to pain    Exercises  Assessment/Plan    PT Assessment Patient needs continued PT services  PT Diagnosis Difficulty walking;Acute pain   PT Problem List Decreased activity tolerance;Decreased balance;Decreased mobility;Pain;Decreased knowledge of use of DME  PT Treatment Interventions DME instruction;Balance training;Modalities;Gait training;Neuromuscular re-education;Stair training;Functional mobility training;Patient/family education;Therapeutic activities;Therapeutic exercise;Wheelchair mobility training   PT Goals (Current goals can be found in the Care Plan section) Acute Rehab PT Goals Patient Stated Goal: less pain PT Goal Formulation: With patient Time For Goal Achievement: 06/29/13 Potential to Achieve Goals: Good    Frequency Min 3X/week   Barriers to discharge        Co-evaluation               End of Session   Activity Tolerance: Patient  limited by pain Patient left: in bed;with call bell/phone within reach;with family/visitor present Nurse Communication: Mobility status    Functional Assessment Tool Used: clinical judgement Functional Limitation: Mobility: Walking and moving around Mobility: Walking and Moving Around Current Status (B3532): At least 20 percent but less than 40 percent impaired, limited or restricted Mobility: Walking and Moving Around Goal Status [id number]): At least 80 percent but less than 100 percent impaired, limited or restricted    Time: 6834-1962 PT Time Calculation (min): 17 min   Charges:   PT Evaluation $Initial PT Evaluation Tier I: 1 Procedure PT Treatments $Therapeutic Activity: 8-22 mins   PT G Codes:   Functional Assessment Tool Used: clinical judgement Functional Limitation: Mobility: Walking and moving around    Kennith Gain 06/22/2013, 11:52 AM

## 2013-06-22 NOTE — Progress Notes (Signed)
PATIENT DETAILS Name: Anthony Miller Age: 58 y.o. Sex: male Date of Birth: [birth year] Admit Date: 06/21/2013 Admitting Physician Phillips Grout, MD MVH:[id number],[email or web address], MD  Subjective: Still complains of back pain-worse with ambulation and sitting up.  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 - Formally consulted orthopedics for further evaluation   Active Problems:   HTN (hypertension) - Continue lisinopril  Disposition: Remain inpatient  DVT Prophylaxis: Prophylactic Lovenox  Code Status: Full code   Family Communication None at bedside  Procedures:  None  CONSULTS:  orthopedic surgery  Time spent 40 minutes-which includes 50% of the time with face-to-face with patient/ family and coordinating care related to the above assessment and plan.    MEDICATIONS: Scheduled Meds: . escitalopram  20 mg Oral QHS  . lisinopril  10 mg Oral Daily  . metaxalone  400 mg Oral TID  . multivitamin with minerals  1 tablet Oral Daily  . [START ON 06/23/2013] predniSONE  30 mg Oral Q breakfast   Followed by  . [START ON 06/24/2013] predniSONE  20 mg Oral Q breakfast   Followed by  . [START ON 06/25/2013] predniSONE  10 mg Oral Q breakfast  . sodium chloride  3 mL Intravenous Q12H   Continuous Infusions:  PRN Meds:.sodium chloride, morphine injection, oxyCODONE-acetaminophen, sodium chloride  Antibiotics: Anti-infectives   None       PHYSICAL EXAM: Vital signs in last 24 hours: Filed Vitals:   06/21/13 2015 06/21/13 2104 06/22/13 0419 06/22/13 0947  BP: 130/71 132/78 109/73 112/73  Pulse: 79 74 66   Temp:  99.3 F (37.4 C) 97.4 F (36.3 C)   TempSrc:  Oral Oral   Resp:  18 18   Height:  6\' 1"  (1.854 m)    Weight:  111.358 kg (245 lb 8 oz)    SpO2: 95% 94% 94%     Weight change:    Filed Weights   06/21/13 1317 06/21/13 2104  Weight: 107.956 kg (238 lb) 111.358 kg (245 lb 8 oz)   Body mass index is 32.4 kg/(m^2).   Gen Exam: Awake and alert with clear speech.   Neck: Supple, No JVD.   Chest: B/L Clear.   CVS: S1 S2 Regular, no murmurs.  Abdomen: soft, BS +, non tender, non distended.  Extremities: no edema, lower extremities warm to touch. Neurologic: Non Focal.  Right leg straight test positive at 30 Skin: No Rash.   Wounds: N/A.    Intake/Output from previous day: No intake or output data in the 24 hours ending 06/22/13 1140   LAB RESULTS: CBC  Recent Labs Lab 06/21/13 1936 06/21/13 2003  WBC 12.3*  --   HGB 15.7 16.3  HCT 45.8 48.0  PLT 240  --   MCV 89.3  --   MCH 30.6  --   MCHC 34.3  --   RDW 12.9  --   LYMPHSABS 1.0  --   MONOABS 0.1  --   EOSABS 0.0  --   BASOSABS 0.0  --     Chemistries   Recent Labs Lab 06/21/13 2003  NA 143  K 4.1  CL 104  GLUCOSE 153*  BUN 14  CREATININE 1.10    CBG: No results found for this basename: GLUCAP,  in the last 168 hours  GFR Estimated Creatinine Clearance:  99.3 ml/min (by C-G formula based on Cr of 1.1).  Coagulation profile No results found for this basename: INR, PROTIME,  in the last 168 hours  Cardiac Enzymes No results found for this basename: CK, CKMB, TROPONINI, MYOGLOBIN,  in the last 168 hours  No components found with this basename: POCBNP,  No results found for this basename: DDIMER,  in the last 72 hours No results found for this basename: HGBA1C,  in the last 72 hours No results found for this basename: CHOL, HDL, LDLCALC, TRIG, CHOLHDL, LDLDIRECT,  in the last 72 hours No results found for this basename: TSH, T4TOTAL, FREET3, T3FREE, THYROIDAB,  in the last 72 hours No results found for this basename: VITAMINB12, FOLATE, FERRITIN, TIBC, IRON, RETICCTPCT,  in the last 72 hours No results found for this basename: LIPASE, AMYLASE,  in the last 72 hours  Urine  Studies No results found for this basename: UACOL, UAPR, USPG, UPH, UTP, UGL, UKET, UBIL, UHGB, UNIT, UROB, ULEU, UEPI, UWBC, URBC, UBAC, CAST, CRYS, UCOM, BILUA,  in the last 72 hours  MICROBIOLOGY: No results found for this or any previous visit (from the past 240 hour(s)).  RADIOLOGY STUDIES/RESULTS: Mr Lumbar Spine Wo Contrast  06/21/2013   CLINICAL DATA:  Low back pain  EXAM: MRI LUMBAR SPINE WITHOUT CONTRAST  TECHNIQUE: Multiplanar, multisequence MR imaging of the lumbar spine was performed. No intravenous contrast was administered.  COMPARISON:  None.  FINDINGS: The lowest lumbar type non-rib-bearing vertebra is labeled as L5. The conus medullaris appears normal. Conus level: L1-2.  Loss of disc height at the L2-3 level with type 2 degenerative endplate findings. Hemangioma noted eccentric to the right in the L3 vertebral body.  Possible Schmorl's node along the posterior superior endplate of L3. Hemangioma noted eccentric to the left in the L4 vertebral body.  Additional findings at individual levels are as follows:  L1-2: Unremarkable.  L2-3: Mild central narrowing of the thecal sac mild right subarticular lateral recess stenosis due to right paracentral disc protrusion, disc bulge, and posterior osseous ridging.  L3-4:  Unremarkable.  L4-5: Moderate right foraminal stenosis due to right foraminal disc protrusion.  L5-S1:  Mild left foraminal stenosis due to facet spurring.  IMPRESSION: 1. 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 2. Spondylosis and degenerative disc disease.   Electronically Signed   By: Sherryl Barters M.D.   On: 06/21/2013 15:51    Shanker Kristeen Mans, MD  [facility] Pager:[phone]  If 7PM-7AM, please contact night-coverage [email or web address] Password [email or web address] 06/22/2013, 11:40 AM   LOS: 1 day

## 2013-06-23 ENCOUNTER — Encounter (HOSPITAL_COMMUNITY): Payer: Self-pay | Admitting: Radiology

## 2013-06-23 MED ORDER — SENNA 8.6 MG PO TABS
2.0000 | ORAL_TABLET | Freq: Every day | ORAL | Status: DC
  Administered 2013-06-23 – 2013-06-26 (×4): 17.2 mg via ORAL
  Filled 2013-06-23 (×6): qty 2

## 2013-06-23 MED ORDER — POLYETHYLENE GLYCOL 3350 17 G PO PACK
17.0000 g | PACK | Freq: Every day | ORAL | Status: DC
  Administered 2013-06-23 – 2013-06-27 (×4): 17 g via ORAL
  Filled 2013-06-23 (×5): qty 1

## 2013-06-23 MED ORDER — ENOXAPARIN SODIUM 40 MG/0.4ML ~~LOC~~ SOLN
40.0000 mg | SUBCUTANEOUS | Status: DC
  Administered 2013-06-25: 40 mg via SUBCUTANEOUS
  Filled 2013-06-23: qty 0.4

## 2013-06-23 MED ORDER — ENOXAPARIN SODIUM 40 MG/0.4ML ~~LOC~~ SOLN
40.0000 mg | SUBCUTANEOUS | Status: AC
  Administered 2013-06-23: 40 mg via SUBCUTANEOUS
  Filled 2013-06-23: qty 0.4

## 2013-06-23 NOTE — Progress Notes (Signed)
PATIENT DETAILS Name: Anthony Miller Age: 58 y.o. Sex: male Date of Birth: 1955/10/18 Admit Date: 06/21/2013 Admitting Physician Phillips Grout, MD JJK:[id number],HBZJIR, MD  Subjective: Continues to have  back pain-worse with ambulation and sitting up.  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 - Evaluated by orthopedics- recommendations are forR L3 SNRB injection, have consulted interventional radiology  Active Problems:   HTN (hypertension) - Continue lisinopril  Disposition: Remain inpatient  DVT Prophylaxis: Prophylactic Lovenox  Code Status: Full code   Family Communication None at bedside  Procedures:  None  CONSULTS:  orthopedic surgery  MEDICATIONS: Scheduled Meds: . [START ON 06/25/2013] enoxaparin (LOVENOX) injection  40 mg Subcutaneous Q24H  . enoxaparin (LOVENOX) injection  40 mg Subcutaneous Q24H  . escitalopram  20 mg Oral QHS  . lisinopril  10 mg Oral Daily  . metaxalone  400 mg Oral TID  . multivitamin with minerals  1 tablet Oral Daily  . [START ON 06/24/2013] predniSONE  20 mg Oral Q breakfast   Followed by  . [START ON 06/25/2013] predniSONE  10 mg Oral Q breakfast  . sodium chloride  3 mL Intravenous Q12H   Continuous Infusions:  PRN Meds:.sodium chloride, morphine injection, oxyCODONE-acetaminophen, sodium chloride  Antibiotics: Anti-infectives   None       PHYSICAL EXAM: Vital signs in last 24 hours: Filed Vitals:   06/22/13 1314 06/22/13 2134 06/23/13 0517 06/23/13 0957  BP: 117/75 136/73 144/88 118/75  Pulse: 74 64 58   Temp: 97.5 F (36.4 C) 97.7 F (36.5 C) 98.2 F (36.8 C)   TempSrc: Oral Oral Oral   Resp: 18 18 16    Height:      Weight:      SpO2: 94% 96% 95%     Weight change:  Filed Weights   06/21/13 1317 06/21/13 2104  Weight:  107.956 kg (238 lb) 111.358 kg (245 lb 8 oz)   Body mass index is 32.4 kg/(m^2).   Gen Exam: Awake and alert with clear speech.   Neck: Supple, No JVD.   Chest: B/L Clear.   CVS: S1 S2 Regular, no murmurs.  Abdomen: soft, BS +, non tender, non distended.  Extremities: no edema, lower extremities warm to touch. Neurologic: Non Focal.  Right leg straight test positive at 30 Skin: No Rash.   Wounds: N/A.    Intake/Output from previous day:  Intake/Output Summary (Last 24 hours) at 06/23/13 1001 Last data filed at 06/22/13 2142  Gross per 24 hour  Intake    666 ml  Output   1850 ml  Net  -1184 ml     LAB RESULTS: CBC  Recent Labs Lab 06/21/13 1936 06/21/13 2003  WBC 12.3*  --   HGB 15.7 16.3  HCT 45.8 48.0  PLT 240  --   MCV 89.3  --   MCH 30.6  --   MCHC 34.3  --   RDW 12.9  --   LYMPHSABS 1.0  --   MONOABS 0.1  --   EOSABS 0.0  --   BASOSABS 0.0  --     Chemistries   Recent Labs Lab 06/21/13 2003  NA 143  K 4.1  CL 104  GLUCOSE 153*  BUN 14  CREATININE 1.10    CBG: No results found for this basename: GLUCAP,  in  the last 168 hours  GFR Estimated Creatinine Clearance: 99.3 ml/min (by C-G formula based on Cr of 1.1).  Coagulation profile No results found for this basename: INR, PROTIME,  in the last 168 hours  Cardiac Enzymes No results found for this basename: CK, CKMB, TROPONINI, MYOGLOBIN,  in the last 168 hours  No components found with this basename: POCBNP,  No results found for this basename: DDIMER,  in the last 72 hours No results found for this basename: HGBA1C,  in the last 72 hours No results found for this basename: CHOL, HDL, LDLCALC, TRIG, CHOLHDL, LDLDIRECT,  in the last 72 hours No results found for this basename: TSH, T4TOTAL, FREET3, T3FREE, THYROIDAB,  in the last 72 hours No results found for this basename: VITAMINB12, FOLATE, FERRITIN, TIBC, IRON, RETICCTPCT,  in the last 72 hours No results found for this basename:  LIPASE, AMYLASE,  in the last 72 hours  Urine Studies No results found for this basename: UACOL, UAPR, USPG, UPH, UTP, UGL, UKET, UBIL, UHGB, UNIT, UROB, ULEU, UEPI, UWBC, URBC, UBAC, CAST, CRYS, UCOM, BILUA,  in the last 72 hours  MICROBIOLOGY: No results found for this or any previous visit (from the past 240 hour(s)).  RADIOLOGY STUDIES/RESULTS: Mr Lumbar Spine Wo Contrast  06/21/2013   CLINICAL DATA:  Low back pain  EXAM: MRI LUMBAR SPINE WITHOUT CONTRAST  TECHNIQUE: Multiplanar, multisequence MR imaging of the lumbar spine was performed. No intravenous contrast was administered.  COMPARISON:  None.  FINDINGS: The lowest lumbar type non-rib-bearing vertebra is labeled as L5. The conus medullaris appears normal. Conus level: L1-2.  Loss of disc height at the L2-3 level with type 2 degenerative endplate findings. Hemangioma noted eccentric to the right in the L3 vertebral body.  Possible Schmorl's node along the posterior superior endplate of L3. Hemangioma noted eccentric to the left in the L4 vertebral body.  Additional findings at individual levels are as follows:  L1-2: Unremarkable.  L2-3: Mild central narrowing of the thecal sac mild right subarticular lateral recess stenosis due to right paracentral disc protrusion, disc bulge, and posterior osseous ridging.  L3-4:  Unremarkable.  L4-5: Moderate right foraminal stenosis due to right foraminal disc protrusion.  L5-S1:  Mild left foraminal stenosis due to facet spurring.  IMPRESSION: 1. 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 2. Spondylosis and degenerative disc disease.   Electronically Signed   By: Sherryl Barters M.D.   On: 06/21/2013 15:51    Corie Allis Kristeen Mans, MD  [facility] Pager:[phone]  If 7PM-7AM, please contact night-coverage [email or web address] Password [email or web address] 06/23/2013, 10:01 AM   LOS: 2 days

## 2013-06-23 NOTE — Progress Notes (Signed)
Physical Therapy Treatment Patient Details Name: Anthony Miller MRN: [id number] DOB: Aug 28, 1955 Today's Date: 06/23/2013    History of Present Illness  patient is a 58 year old gentleman with past medical history of chronic lumbar spine stenosis, hypertension, GERD, who presents with acutely worsening back pain. Per pt, the plan is for epidural steriod injections 5/11    PT Comments    Back pain continues to limit activity tolerance, though pt quite willing to work; Hopeful that with spinal injection tomorrow, his pain will be much reduced and pt will be able to move well enough to dc home safely  Follow Up Recommendations  Outpatient PT     Equipment Recommendations  Rolling walker with 5" wheels    Recommendations for Other Services OT consult     Precautions / Restrictions Precautions Precautions: Back Precaution Comments: for comfort    Mobility  Bed Mobility Overal bed mobility: Modified Independent             General bed mobility comments: uses bed rails and log roll technique  Transfers Overall transfer level: Needs assistance Equipment used: Rolling walker (2 wheeled) Transfers: Sit to/from Stand Sit to Stand: Min guard         General transfer comment: min guard for balance as pt stands without wt bearing on R LE due to pain, Ableto touch RLE to floor, but toe touch at that; when attempted to get foot flat on floor, pain became unbearable and pt sat back down to bed  Ambulation/Gait             General Gait Details: Attempted but in too much pain   Stairs            Wheelchair Mobility    Modified Rankin (Stroke Patients Only)       Balance                                    Cognition Arousal/Alertness: Awake/alert Behavior During Therapy: WFL for tasks assessed/performed Overall Cognitive Status: Within Functional Limits for tasks assessed                      Exercises      General Comments         Pertinent Vitals/Pain 10/10 back pain; worse sitting/standing; Better sidelying; RN had given meds for pain; patient repositioned for comfort     Home Living                      Prior Function            PT Goals (current goals can now be found in the care plan section) Acute Rehab PT Goals Patient Stated Goal: less pain PT Goal Formulation: With patient Time For Goal Achievement: 06/29/13 Potential to Achieve Goals: Good Progress towards PT goals: Progressing toward goals (though limited by pain)    Frequency  Min 3X/week    PT Plan Current plan remains appropriate    Co-evaluation             End of Session   Activity Tolerance: Patient limited by pain Patient left: in bed;with call bell/phone within reach;with nursing/sitter in room     Time: 5852-7782 PT Time Calculation (min): 11 min  Charges:  $Therapeutic Activity: 8-22 mins  G Codes:      [facility] 06/23/2013, 12:54 PM  Roney Marion, [place] Pager [phone] Office [phone]

## 2013-06-23 NOTE — Progress Notes (Signed)
Pt with continued R leg pain 10/10 into groin and upper thigh consistent with R L3 radic. He states pain is unimproved and he continues to be unable to sit upright or bear weight. He had a very rough night with little sleep. He is eager for injection. Taking PO's well.   BP 111/66  Pulse 61  Temp(Src) 97.7 F (36.5 C) (Oral)  Resp 16  Ht 6\' 1"  (1.854 m)  Wt 111.358 kg (245 lb 8 oz)  BMI 32.40 kg/m2  SpO2 95%  Pt laying down in bed on L side, continued uncomfortable appearance. Strength remains full BIL LE's. NVI.   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 Hx or prior episode >68yrs ago resolved with injection therapy   -Pending R L3 SNRB per IR 5/11  -Lovenox will be held 5/11  -Hopeful pain will be controlled via inj and pt will be able to proceed home   -F/U in office 2wks post inj  -If ongoing pain with no lasting relief with inj but only transient relief will discuss surgicl intervention.  -Moderate DDD L2-3

## 2013-06-23 NOTE — H&P (Signed)
Chief Complaint: "Low back pain radiating down right leg." Referring Physician: Dr. Sloan Leiter HPI: Anthony Miller is an 58 y.o. male who presented with low back pain 10/10 radiating down right leg associated with tingling and unable to bear weight. 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 1 week ago he was pulling a patient and experienced extreme pain. He admits to right inner thigh/groin pain and anterior thigh pressure and pain radiating and tingling to his toes. He denies any loss of bowel or bladder function. Orthopedics have evaluated the patient and do not feel emergent surgical intervention is required as there is no cauda equina and no substantial weakness on exam. Given extreme pain, IR received request for image guided right L3 SNRB. The patient states he has had pain injections to his back previously many years ago that worked great for pain control. He denies any chest pain, shortness of breath or palpitations. He denies any active signs of bleeding or excessive bruising. He denies any recent fever or chills.    Past Medical History:  Past Medical History  Diagnosis Date  . Hypertension   . GERD (gastroesophageal reflux disease)   . Pneumonia 1970's; ~ 2010  . Arthritis     "lower back/pelvic region" (06/21/2013)    Past Surgical History:  Past Surgical History  Procedure Laterality Date  . Laparoscopic cholecystectomy  ~ 1999  . Tonsillectomy  1975    Family History: History reviewed. No pertinent family history.  Social History:  reports that he has never smoked. He has quit using smokeless tobacco. He reports that he drinks about 1.2 ounces of alcohol per week. He reports that he does not use illicit drugs.  Allergies: No Known Allergies  Medications:   Medication List    ASK your doctor about these medications       escitalopram 20 MG tablet  Commonly known as:  LEXAPRO  Take 20 mg by mouth at bedtime.     FISH OIL PO  Take 1 capsule by mouth 2 (two) times daily.     HYDROcodone-acetaminophen 5-325 MG per tablet  Commonly known as:  NORCO/VICODIN  Take 1-2 tablets by mouth every 6 (six) hours as needed for moderate pain.     lisinopril 10 MG tablet  Commonly known as:  PRINIVIL,ZESTRIL  Take 10 mg by mouth daily.     multivitamin with minerals Tabs tablet  Take 1 tablet by mouth daily.     predniSONE 10 MG tablet  Commonly known as:  DELTASONE  Take 10-60 mg by mouth daily. *patient on 6 day taper pak. Takes 60mg  day 1, then tapers 10mg  daily for 6 days until gone*       Please HPI for pertinent positives, otherwise complete 10 system ROS negative.  Physical Exam: BP 118/75  Pulse 58  Temp(Src) 98.2 F (36.8 C) (Oral)  Resp 16  Ht 6\' 1"  (1.854 m)  Wt 245 lb 8 oz (111.358 kg)  BMI 32.40 kg/m2  SpO2 95% Body mass index is 32.4 kg/(m^2).  General Appearance:  Alert, cooperative, appears in pain  Head:  Normocephalic, without obvious abnormality, atraumatic  Neck: Supple, symmetrical, trachea midline  Lungs:   Clear to auscultation bilaterally, no w/r/r, respirations unlabored without use of accessory muscles.  Chest Wall:  No tenderness or deformity  Heart:  Regular rate and rhythm, S1, S2 normal, no murmur, rub or gallop.  Abdomen:   Soft,  non-tender, non distended.  Extremities: Extremities decreased ROM RLE secondary to pain, significant pain with any movement, no weakness noted. Ext warm bilaterally.   Pulses: 2+ and symmetric  Neurologic: Normal affect, no gross deficits.   Results for orders placed during the hospital encounter of 06/21/13 (from the past 48 hour(s))  CBC WITH DIFFERENTIAL     Status: Abnormal   Collection Time    06/21/13  7:36 PM      Result Value Ref Range   WBC 12.3 (*) 4.0 - 10.5 K/uL   RBC 5.13  4.22 - 5.81 MIL/uL   Hemoglobin 15.7  13.0 - 17.0 g/dL   HCT 45.8  39.0 - 52.0 %   MCV 89.3  78.0 - 100.0 fL   MCH 30.6  26.0 - 34.0 pg   MCHC  34.3  30.0 - 36.0 g/dL   RDW 12.9  11.5 - 15.5 %   Platelets 240  150 - 400 K/uL   Neutrophils Relative % 91 (*) 43 - 77 %   Neutro Abs 11.2 (*) 1.7 - 7.7 K/uL   Lymphocytes Relative 8 (*) 12 - 46 %   Lymphs Abs 1.0  0.7 - 4.0 K/uL   Monocytes Relative 1 (*) 3 - 12 %   Monocytes Absolute 0.1  0.1 - 1.0 K/uL   Eosinophils Relative 0  0 - 5 %   Eosinophils Absolute 0.0  0.0 - 0.7 K/uL   Basophils Relative 0  0 - 1 %   Basophils Absolute 0.0  0.0 - 0.1 K/uL  I-STAT CHEM 8, ED     Status: Abnormal   Collection Time    06/21/13  8:03 PM      Result Value Ref Range   Sodium 143  137 - 147 mEq/L   Potassium 4.1  3.7 - 5.3 mEq/L   Chloride 104  96 - 112 mEq/L   BUN 14  6 - 23 mg/dL   Creatinine, Ser 1.10  0.50 - 1.35 mg/dL   Glucose, Bld 153 (*) 70 - 99 mg/dL   Calcium, Ion 1.20  1.12 - 1.23 mmol/L   TCO2 21  0 - 100 mmol/L   Hemoglobin 16.3  13.0 - 17.0 g/dL   HCT 48.0  39.0 - 52.0 %   Mr Lumbar Spine Wo Contrast  06/21/2013   CLINICAL DATA:  Low back pain  EXAM: MRI LUMBAR SPINE WITHOUT CONTRAST  TECHNIQUE: Multiplanar, multisequence MR imaging of the lumbar spine was performed. No intravenous contrast was administered.  COMPARISON:  None.  FINDINGS: The lowest lumbar type non-rib-bearing vertebra is labeled as L5. The conus medullaris appears normal. Conus level: L1-2.  Loss of disc height at the L2-3 level with type 2 degenerative endplate findings. Hemangioma noted eccentric to the right in the L3 vertebral body.  Possible Schmorl's node along the posterior superior endplate of L3. Hemangioma noted eccentric to the left in the L4 vertebral body.  Additional findings at individual levels are as follows:  L1-2: Unremarkable.  L2-3: Mild central narrowing of the thecal sac mild right subarticular lateral recess stenosis due to right paracentral disc protrusion, disc bulge, and posterior osseous ridging.  L3-4:  Unremarkable.  L4-5: Moderate right foraminal stenosis due to right foraminal disc  protrusion.  L5-S1:  Mild left foraminal stenosis due to facet spurring.  IMPRESSION: 1. 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 2. Spondylosis and degenerative disc disease.   Electronically Signed  By: Sherryl Barters M.D.   On: 06/21/2013 15:51    Assessment/Plan Intractable low back pain with RLE radiculopathy.  MRI revealed R L2-3 Lat recess stenosis and moderate right foraminal stenosis at L4-5 due to a age indeterminate right foraminal disc protrusion. Orthopedic request image guided right lumbar level 3 selective nerve root block Will hold lovenox 5/11  Risks and Benefits discussed with the patient. All of the patient's questions were answered, patient is agreeable to proceed. Consent signed and in chart.   Hedy Jacob PA-C 06/23/2013, 10:17 AM

## 2013-06-24 ENCOUNTER — Inpatient Hospital Stay (HOSPITAL_COMMUNITY): Payer: Worker's Compensation

## 2013-06-24 DIAGNOSIS — K5900 Constipation, unspecified: Secondary | ICD-10-CM

## 2013-06-24 DIAGNOSIS — M545 Low back pain, unspecified: Secondary | ICD-10-CM

## 2013-06-24 MED ORDER — METHYLPREDNISOLONE ACETATE 80 MG/ML IJ SUSP
INTRAMUSCULAR | Status: AC
  Administered 2013-06-24: 80 mg
  Filled 2013-06-24: qty 1

## 2013-06-24 MED ORDER — METHYLPREDNISOLONE ACETATE 40 MG/ML IJ SUSP
INTRAMUSCULAR | Status: AC
  Administered 2013-06-24: 40 mg
  Filled 2013-06-24: qty 5

## 2013-06-24 MED ORDER — FLEET ENEMA 7-19 GM/118ML RE ENEM
1.0000 | ENEMA | Freq: Every day | RECTAL | Status: DC | PRN
  Filled 2013-06-24: qty 1

## 2013-06-24 NOTE — Progress Notes (Signed)
PATIENT DETAILS Name: Anthony Miller Age: 58 y.o. Sex: male Date of Birth: 07/18/1955 Admit Date: 06/21/2013 Admitting Physician Phillips Grout, MD TWS:[email or web address], MD  Subjective: Continues to have  back pain-worse with ambulation and sitting up.  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 - Evaluated by orthopedics- recommendations are forR L3 SNRB injection, interventional cardiology consulted, patient scheduled for procedure on 5/11. We'll reevaluate post procedure to see if improvement, if improved will discharge home later today.  Active Problems:   HTN (hypertension) - Continue lisinopril  Constipation - Secondary to narcotics. Started on bowel regimen MiraLax and Senokot without success, will try one dose of Fleet enema later today.  Disposition: Remain inpatient-? Home either today or tomorrow  DVT Prophylaxis: Prophylactic Lovenox  Code Status: Full code   Family Communication None at bedside  Procedures:  None  CONSULTS:  orthopedic surgery  MEDICATIONS: Scheduled Meds: . [START ON 06/25/2013] enoxaparin (LOVENOX) injection  40 mg Subcutaneous Q24H  . escitalopram  20 mg Oral QHS  . lisinopril  10 mg Oral Daily  . metaxalone  400 mg Oral TID  . multivitamin with minerals  1 tablet Oral Daily  . polyethylene glycol  17 g Oral Daily  . [START ON 06/25/2013] predniSONE  10 mg Oral Q breakfast  . senna  2 tablet Oral QHS  . sodium chloride  3 mL Intravenous Q12H   Continuous Infusions:  PRN Meds:.sodium chloride, morphine injection, oxyCODONE-acetaminophen, sodium chloride  Antibiotics: Anti-infectives   None       PHYSICAL EXAM: Vital signs in last 24 hours: Filed Vitals:   06/23/13 0957 06/23/13 1507 06/23/13 2034 06/24/13 0626  BP: 118/75 111/66 118/75 129/83    Pulse:  61 60 56  Temp:  97.7 F (36.5 C) 97.5 F (36.4 C) 98.1 F (36.7 C)  TempSrc:  Oral Oral Oral  Resp:  16 16 16   Height:      Weight:      SpO2:  95% 96% 94%    Weight change:  Filed Weights   06/21/13 1317 06/21/13 2104  Weight: 107.956 kg (238 lb) 111.358 kg (245 lb 8 oz)   Body mass index is 32.4 kg/(m^2).   Gen Exam: Awake and alert with clear speech.   Neck: Supple, No JVD.   Chest: B/L Clear.   CVS: S1 S2 Regular, no murmurs.  Abdomen: soft, BS +, non tender, non distended.  Extremities: no edema, lower extremities warm to touch. Neurologic: Non Focal.  Right leg straight test positive at 30 Skin: No Rash.   Wounds: N/A.    Intake/Output from previous day:  Intake/Output Summary (Last 24 hours) at 06/24/13 0952 Last data filed at 06/24/13 0625  Gross per 24 hour  Intake    444 ml  Output   1900 ml  Net  -1456 ml     LAB RESULTS: CBC  Recent Labs Lab 06/21/13 1936 06/21/13 2003  WBC 12.3*  --   HGB 15.7 16.3  HCT 45.8 48.0  PLT 240  --   MCV 89.3  --   MCH 30.6  --   MCHC 34.3  --   RDW 12.9  --   LYMPHSABS 1.0  --   MONOABS 0.1  --   EOSABS 0.0  --   BASOSABS 0.0  --  Chemistries   Recent Labs Lab 06/21/13 2003  NA 143  K 4.1  CL 104  GLUCOSE 153*  BUN 14  CREATININE 1.10    CBG: No results found for this basename: GLUCAP,  in the last 168 hours  GFR Estimated Creatinine Clearance: 99.3 ml/min (by C-G formula based on Cr of 1.1).  Coagulation profile No results found for this basename: INR, PROTIME,  in the last 168 hours  Cardiac Enzymes No results found for this basename: CK, CKMB, TROPONINI, MYOGLOBIN,  in the last 168 hours  No components found with this basename: POCBNP,  No results found for this basename: DDIMER,  in the last 72 hours No results found for this basename: HGBA1C,  in the last 72 hours No results found for this basename: CHOL, HDL, LDLCALC, TRIG, CHOLHDL, LDLDIRECT,  in the last 72  hours No results found for this basename: TSH, T4TOTAL, FREET3, T3FREE, THYROIDAB,  in the last 72 hours No results found for this basename: VITAMINB12, FOLATE, FERRITIN, TIBC, IRON, RETICCTPCT,  in the last 72 hours No results found for this basename: LIPASE, AMYLASE,  in the last 72 hours  Urine Studies No results found for this basename: UACOL, UAPR, USPG, UPH, UTP, UGL, UKET, UBIL, UHGB, UNIT, UROB, ULEU, UEPI, UWBC, URBC, UBAC, CAST, CRYS, UCOM, BILUA,  in the last 72 hours  MICROBIOLOGY: No results found for this or any previous visit (from the past 240 hour(s)).  RADIOLOGY STUDIES/RESULTS: Mr Lumbar Spine Wo Contrast  06/21/2013   CLINICAL DATA:  Low back pain  EXAM: MRI LUMBAR SPINE WITHOUT CONTRAST  TECHNIQUE: Multiplanar, multisequence MR imaging of the lumbar spine was performed. No intravenous contrast was administered.  COMPARISON:  None.  FINDINGS: The lowest lumbar type non-rib-bearing vertebra is labeled as L5. The conus medullaris appears normal. Conus level: L1-2.  Loss of disc height at the L2-3 level with type 2 degenerative endplate findings. Hemangioma noted eccentric to the right in the L3 vertebral body.  Possible Schmorl's node along the posterior superior endplate of L3. Hemangioma noted eccentric to the left in the L4 vertebral body.  Additional findings at individual levels are as follows:  L1-2: Unremarkable.  L2-3: Mild central narrowing of the thecal sac mild right subarticular lateral recess stenosis due to right paracentral disc protrusion, disc bulge, and posterior osseous ridging.  L3-4:  Unremarkable.  L4-5: Moderate right foraminal stenosis due to right foraminal disc protrusion.  L5-S1:  Mild left foraminal stenosis due to facet spurring.  IMPRESSION: 1. 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 2. Spondylosis and degenerative disc disease.   Electronically Signed   By: Sherryl Barters M.D.   On: 06/21/2013  15:51    Shanker Kristeen Mans, MD  [facility] Pager:[phone]  If 7PM-7AM, please contact night-coverage [email or web address] Password [email or web address] 06/24/2013, 9:52 AM   LOS: 3 days

## 2013-06-24 NOTE — Progress Notes (Addendum)
PT Cancellation Note  Patient Details Name: Anthony Miller MRN: [id number] DOB: 1955-11-18   Cancelled Treatment:     Pt currently off floor for procedure.  Will attempt back later today if time allows.   2:40 PM-- 2nd attempt for therapy.  Pt politely deferring therapy due to just returning back to room & wanting to eat lunch.  Pt states he is agreeable to participate later today.  Told pt that I will be unable to come back today but will attempt to have another therapist see him.     [name] 06/24/2013, 1:36 PM   Sarajane Marek, PTA [phone] 06/24/2013

## 2013-06-25 MED ORDER — DEXAMETHASONE SODIUM PHOSPHATE 4 MG/ML IJ SOLN
4.0000 mg | Freq: Three times a day (TID) | INTRAMUSCULAR | Status: DC
  Administered 2013-06-25 – 2013-06-26 (×4): 4 mg via INTRAVENOUS
  Filled 2013-06-25 (×6): qty 1

## 2013-06-25 NOTE — Progress Notes (Signed)
Patient continues to c/o ongoing and severe right groin and leg pain.  Right L3 nerve block only helped about 10-20%. Describes prominent weakness  Blood pressure 114/72, pulse 63, temperature 97.9 F (36.6 C), temperature source Oral, resp. rate 16, height 6\' 1"  (1.854 m), weight 111.358 kg (245 lb 8 oz), SpO2 95.00%.  4/5 knee extension strength on left 0 knee reflex on left, 2+ on right   MRI:   L2-3: Mild central narrowing of the thecal sac mild right  subarticular lateral recess stenosis due to right paracentral disc  protrusion, disc bulge, and posterior osseous ridging.  L3-4: Unremarkable.  L4-5: Moderate right foraminal stenosis due to right foraminal disc  protrusion.   Ongoing severe right leg pain with MRI findings notable for lateral recess compression at L2/3 and formainal compression at L4/5 on the right.  I do favor the foraminal HNP at 4/5 causing pain  - discussed L4/5 decompression with patient. Discussed risks of infection, nerve injury, ASD, instability, lack of resolution of pain extensively with the patient and he has elected to proceed with surgery for tomorrow - NPO after midnight  - I would be happy to transfer patient to my service after surgery tomorrow

## 2013-06-25 NOTE — Progress Notes (Signed)
PATIENT DETAILS Name: Anthony Miller Age: 58 y.o. Sex: male Date of Birth: [birth year] Admit Date: 06/21/2013 Admitting Physician Phillips Grout, MD PYK:[email or web address], MD  Subjective: Continues to have  back pain- minimal change with epidural injection yesterday.  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 - Evaluated by orthopedics- recommendations were for R L3 SNRB injection, interventional radiology was consulted, patient underwent  procedure on 5/11. Unfortunately continues to have significant pain preventing ambulation, spoke with orthopedics Dr. Lynann Bologna, will evaluate later today for further recommendations. Patient may eventually require surgery. In the meantime, will restart steroids-start Decadron. Continue with supportive care with narcotics, muscle relaxants.  Active Problems:   HTN (hypertension) - Continue lisinopril  Constipation - Secondary to narcotics. Started on bowel regimen MiraLax and Senokot without success, will try one dose of Fleet enema later today.  Disposition: Remain inpatient  DVT Prophylaxis: Prophylactic Lovenox  Code Status: Full code   Family Communication None at bedside  Procedures:  None  CONSULTS:  orthopedic surgery  MEDICATIONS: Scheduled Meds: . dexamethasone  4 mg Intravenous TID  . enoxaparin (LOVENOX) injection  40 mg Subcutaneous Q24H  . escitalopram  20 mg Oral QHS  . lisinopril  10 mg Oral Daily  . metaxalone  400 mg Oral TID  . multivitamin with minerals  1 tablet Oral Daily  . polyethylene glycol  17 g Oral Daily  . senna  2 tablet Oral QHS  . sodium chloride  3 mL Intravenous Q12H   Continuous Infusions:  PRN Meds:.sodium chloride, morphine injection, oxyCODONE-acetaminophen, sodium chloride, sodium  phosphate  Antibiotics: Anti-infectives   None       PHYSICAL EXAM: Vital signs in last 24 hours: Filed Vitals:   06/24/13 1028 06/24/13 1423 06/24/13 2135 06/25/13 0542  BP: 126/83 134/82 106/66 114/72  Pulse:  61 71 63  Temp:  98.2 F (36.8 C) 98.1 F (36.7 C) 97.9 F (36.6 C)  TempSrc:  Oral Oral Oral  Resp:  15 16 16   Height:      Weight:      SpO2:  95% 95% 95%    Weight change:  Filed Weights   06/21/13 1317 06/21/13 2104  Weight: 107.956 kg (238 lb) 111.358 kg (245 lb 8 oz)   Body mass index is 32.4 kg/(m^2).   Gen Exam: Awake and alert with clear speech.   Neck: Supple, No JVD.   Chest: B/L Clear.   CVS: S1 S2 Regular, no murmurs.  Abdomen: soft, BS +, non tender, non distended.  Extremities: no edema, lower extremities warm to touch. Neurologic: Non Focal.  Mild right leg weakness secondary to pain. Skin: No Rash.   Wounds: N/A.    Intake/Output from previous day:  Intake/Output Summary (Last 24 hours) at 06/25/13 1310 Last data filed at 06/25/13 0958  Gross per 24 hour  Intake      3 ml  Output   2000 ml  Net  -1997 ml     LAB RESULTS: CBC  Recent Labs Lab 06/21/13 1936 06/21/13 2003  WBC 12.3*  --   HGB 15.7 16.3  HCT 45.8 48.0  PLT 240  --   MCV 89.3  --   MCH 30.6  --   MCHC 34.3  --   RDW 12.9  --   LYMPHSABS 1.0  --  MONOABS 0.1  --   EOSABS 0.0  --   BASOSABS 0.0  --     Chemistries   Recent Labs Lab 06/21/13 2003  NA 143  K 4.1  CL 104  GLUCOSE 153*  BUN 14  CREATININE 1.10    CBG: No results found for this basename: GLUCAP,  in the last 168 hours  GFR Estimated Creatinine Clearance: 99.3 ml/min (by C-G formula based on Cr of 1.1).  Coagulation profile No results found for this basename: INR, PROTIME,  in the last 168 hours  Cardiac Enzymes No results found for this basename: CK, CKMB, TROPONINI, MYOGLOBIN,  in the last 168 hours  No components found with this basename: POCBNP,  No results found for  this basename: DDIMER,  in the last 72 hours No results found for this basename: HGBA1C,  in the last 72 hours No results found for this basename: CHOL, HDL, LDLCALC, TRIG, CHOLHDL, LDLDIRECT,  in the last 72 hours No results found for this basename: TSH, T4TOTAL, FREET3, T3FREE, THYROIDAB,  in the last 72 hours No results found for this basename: VITAMINB12, FOLATE, FERRITIN, TIBC, IRON, RETICCTPCT,  in the last 72 hours No results found for this basename: LIPASE, AMYLASE,  in the last 72 hours  Urine Studies No results found for this basename: UACOL, UAPR, USPG, UPH, UTP, UGL, UKET, UBIL, UHGB, UNIT, UROB, ULEU, UEPI, UWBC, URBC, UBAC, CAST, CRYS, UCOM, BILUA,  in the last 72 hours  MICROBIOLOGY: No results found for this or any previous visit (from the past 240 hour(s)).  RADIOLOGY STUDIES/RESULTS: Mr Lumbar Spine Wo Contrast  06/21/2013   CLINICAL DATA:  Low back pain  EXAM: MRI LUMBAR SPINE WITHOUT CONTRAST  TECHNIQUE: Multiplanar, multisequence MR imaging of the lumbar spine was performed. No intravenous contrast was administered.  COMPARISON:  None.  FINDINGS: The lowest lumbar type non-rib-bearing vertebra is labeled as L5. The conus medullaris appears normal. Conus level: L1-2.  Loss of disc height at the L2-3 level with type 2 degenerative endplate findings. Hemangioma noted eccentric to the right in the L3 vertebral body.  Possible Schmorl's node along the posterior superior endplate of L3. Hemangioma noted eccentric to the left in the L4 vertebral body.  Additional findings at individual levels are as follows:  L1-2: Unremarkable.  L2-3: Mild central narrowing of the thecal sac mild right subarticular lateral recess stenosis due to right paracentral disc protrusion, disc bulge, and posterior osseous ridging.  L3-4:  Unremarkable.  L4-5: Moderate right foraminal stenosis due to right foraminal disc protrusion.  L5-S1:  Mild left foraminal stenosis due to facet spurring.  IMPRESSION: 1.  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 2. Spondylosis and degenerative disc disease.   Electronically Signed   By: Sherryl Barters M.D.   On: 06/21/2013 15:51    Shanker Kristeen Mans, MD  [facility] Pager:[phone]  If 7PM-7AM, please contact night-coverage [email or web address] Password [email or web address] 06/25/2013, 1:10 PM   LOS: 4 days

## 2013-06-25 NOTE — Progress Notes (Signed)
Pt placed on Cpap for the night .tol. Well.

## 2013-06-25 NOTE — Progress Notes (Signed)
Physical Therapy Treatment Patient Details Name: Anthony Miller MRN: [id number] DOB: Mar 23, 1955 Today's Date: 06/25/2013    History of Present Illness  patient is a 58 year old gentleman with past medical history of chronic lumbar spine stenosis, hypertension, GERD, who presents with acutely worsening back pain. Per pt, the plan is for epidural steriod injections 5/11    PT Comments    Pt able to ambulate this session but reports pain beginning to feel the same as it was prior to injection.  Spoke with RN who reports plans are possibly for Neuro consult.  Will continue to follow acutely.    Follow Up Recommendations  Outpatient PT     Equipment Recommendations  Rolling walker with 5" wheels    Recommendations for Other Services OT consult     Precautions / Restrictions Precautions Precautions: Back Precaution Comments: for comfort Restrictions Weight Bearing Restrictions: No    Mobility  Bed Mobility Overal bed mobility: Modified Independent             General bed mobility comments: uses bed rails and log roll technique  Transfers Overall transfer level: Modified independent Equipment used: Rolling walker (2 wheeled);None Transfers: Sit to/from Stand              Ambulation/Gait Ambulation/Gait assistance: Min guard Ambulation Distance (Feet): 80 Feet Assistive device: Rolling walker (2 wheeled) Gait Pattern/deviations: Step-through pattern;Decreased weight shift to right;Decreased stance time - right;Antalgic;Trunk flexed     General Gait Details: Relies heavily on RW with UE's due to RLE pain.  Pain increasing as distance increased.  Cues for safe use of RW   Stairs            Wheelchair Mobility    Modified Rankin (Stroke Patients Only)       Balance                                    Cognition Arousal/Alertness: Awake/alert Behavior During Therapy: WFL for tasks assessed/performed Overall Cognitive Status:  Within Functional Limits for tasks assessed                      Exercises      General Comments        Pertinent Vitals/Pain 10/10 RLE.  RN notified for pain medication.  Pt unable to tolerate sitting in recliner due to excruciating pain.  Repositioned in bed for comfort.     Home Living                      Prior Function            PT Goals (current goals can now be found in the care plan section) Acute Rehab PT Goals Patient Stated Goal: less pain PT Goal Formulation: With patient Time For Goal Achievement: 06/29/13 Potential to Achieve Goals: Good Progress towards PT goals: Progressing toward goals    Frequency  Min 3X/week    PT Plan Current plan remains appropriate    Co-evaluation             End of Session Equipment Utilized During Treatment: Gait belt Activity Tolerance: Patient limited by pain Patient left: in bed;with call bell/phone within reach     Time: 0745-0759 PT Time Calculation (min): 14 min  Charges:  $Gait Training: 8-22 mins  G Codes:      [name] 07-24-13, 8:06 AM  Sarajane Marek, PTA [phone] 2013-07-24

## 2013-06-25 NOTE — H&P (Signed)
PREOPERATIVE H&P  Chief Complaint: left leg pain  HPI: Anthony Miller is a 58 y.o. male who presents with ongoing pain in the left leg. Pain c/w L4 radic. MRI = forminal HNP on right at L4/5. Patient has severe disabling pain and weakness and is currently unable to walk.   Past Medical History  Diagnosis Date  . Hypertension   . GERD (gastroesophageal reflux disease)   . Pneumonia 1970's; ~ 2010  . Arthritis     "lower back/pelvic region" (06/21/2013)   Past Surgical History  Procedure Laterality Date  . Laparoscopic cholecystectomy  ~ 1999  . Tonsillectomy  1975   History   Social History  . Marital Status: Married    Spouse Name: N/A    Number of Children: N/A  . Years of Education: N/A   Social History Main Topics  . Smoking status: Never Smoker   . Smokeless tobacco: Former Systems developer     Comment: "quit chewing in the 1980's"  . Alcohol Use: 1.2 oz/week    2 Shots of liquor per week  . Drug Use: No  . Sexual Activity: Yes   Other Topics Concern  . None   Social History Narrative  . None   History reviewed. No pertinent family history. No Known Allergies Prior to Admission medications   Medication Sig Start Date End Date Taking? Authorizing Provider  escitalopram (LEXAPRO) 20 MG tablet Take 20 mg by mouth at bedtime.   Yes Historical Provider, MD  HYDROcodone-acetaminophen (NORCO/VICODIN) 5-325 MG per tablet Take 1-2 tablets by mouth every 6 (six) hours as needed for moderate pain.    Yes Historical Provider, MD  lisinopril (PRINIVIL,ZESTRIL) 10 MG tablet Take 10 mg by mouth daily.   Yes Historical Provider, MD  Multiple Vitamin (MULITIVITAMIN WITH MINERALS) TABS Take 1 tablet by mouth daily.   Yes Historical Provider, MD  Omega-3 Fatty Acids (FISH OIL PO) Take 1 capsule by mouth 2 (two) times daily.   Yes Historical Provider, MD  predniSONE (DELTASONE) 10 MG tablet Take 10-60 mg by mouth daily. *patient on 6 day taper pak. Takes 60mg  day 1, then tapers 10mg  daily  for 6 days until gone*   Yes Historical Provider, MD     All other systems have been reviewed and were otherwise negative with the exception of those mentioned in the HPI and as above.  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

## 2013-06-26 ENCOUNTER — Inpatient Hospital Stay (HOSPITAL_COMMUNITY): Payer: Worker's Compensation

## 2013-06-26 ENCOUNTER — Encounter (HOSPITAL_COMMUNITY)
Admission: EM | Disposition: A | Discharge status: Home / Self Care | Payer: Self-pay | Source: Home / Self Care | Attending: Internal Medicine

## 2013-06-26 ENCOUNTER — Inpatient Hospital Stay (HOSPITAL_COMMUNITY): Payer: Worker's Compensation | Admitting: Certified Registered Nurse Anesthetist

## 2013-06-26 ENCOUNTER — Encounter (HOSPITAL_COMMUNITY): Payer: Worker's Compensation | Admitting: Certified Registered Nurse Anesthetist

## 2013-06-26 ENCOUNTER — Encounter (HOSPITAL_COMMUNITY): Payer: Self-pay | Admitting: Certified Registered Nurse Anesthetist

## 2013-06-26 DIAGNOSIS — M5410 Radiculopathy, site unspecified: Secondary | ICD-10-CM | POA: Diagnosis present

## 2013-06-26 HISTORY — PX: LUMBAR LAMINECTOMY/DECOMPRESSION MICRODISCECTOMY: SHX5026

## 2013-06-26 LAB — SURGICAL PCR SCREEN
MRSA, PCR: NEGATIVE
Staphylococcus aureus: NEGATIVE

## 2013-06-26 SURGERY — LUMBAR LAMINECTOMY/DECOMPRESSION MICRODISCECTOMY
Anesthesia: General | Site: Back

## 2013-06-26 MED ORDER — SUCCINYLCHOLINE CHLORIDE 20 MG/ML IJ SOLN
INTRAMUSCULAR | Status: DC | PRN
  Administered 2013-06-26: 100 mg via INTRAVENOUS

## 2013-06-26 MED ORDER — CEFAZOLIN SODIUM-DEXTROSE 2-3 GM-% IV SOLR
INTRAVENOUS | Status: DC | PRN
  Administered 2013-06-26: 2 g via INTRAVENOUS

## 2013-06-26 MED ORDER — LACTATED RINGERS IV SOLN
INTRAVENOUS | Status: DC | PRN
  Administered 2013-06-26 (×2): via INTRAVENOUS

## 2013-06-26 MED ORDER — KETOROLAC TROMETHAMINE 30 MG/ML IJ SOLN
15.0000 mg | Freq: Once | INTRAMUSCULAR | Status: AC | PRN
  Administered 2013-06-26: 30 mg via INTRAVENOUS

## 2013-06-26 MED ORDER — ONDANSETRON HCL 4 MG/2ML IJ SOLN: 4.0000 mg | INTRAMUSCULAR | Status: DC | PRN

## 2013-06-26 MED ORDER — GLYCOPYRROLATE 0.2 MG/ML IJ SOLN
INTRAMUSCULAR | Status: DC | PRN
  Administered 2013-06-26 (×2): 0.1 mg via INTRAVENOUS

## 2013-06-26 MED ORDER — DIAZEPAM 5 MG PO TABS: 5.0000 mg | ORAL_TABLET | Freq: Four times a day (QID) | ORAL | Status: DC | PRN

## 2013-06-26 MED ORDER — MEPERIDINE HCL 25 MG/ML IJ SOLN: 6.2500 mg | INTRAMUSCULAR | Status: DC | PRN

## 2013-06-26 MED ORDER — FENTANYL CITRATE 0.05 MG/ML IJ SOLN
INTRAMUSCULAR | Status: AC
  Filled 2013-06-26: qty 5

## 2013-06-26 MED ORDER — ACETAMINOPHEN 650 MG RE SUPP: 650.0000 mg | RECTAL | Status: DC | PRN

## 2013-06-26 MED ORDER — ROCURONIUM BROMIDE 50 MG/5ML IV SOLN
INTRAVENOUS | Status: AC
  Filled 2013-06-26: qty 1

## 2013-06-26 MED ORDER — BUPIVACAINE-EPINEPHRINE (PF) 0.25% -1:200000 IJ SOLN
INTRAMUSCULAR | Status: AC
  Filled 2013-06-26: qty 30

## 2013-06-26 MED ORDER — METHYLPREDNISOLONE ACETATE 40 MG/ML IJ SUSP
INTRAMUSCULAR | Status: DC | PRN
  Administered 2013-06-26: 40 mg via INTRA_ARTICULAR

## 2013-06-26 MED ORDER — METHYLENE BLUE 1 % INJ SOLN
INTRAMUSCULAR | Status: AC
  Filled 2013-06-26: qty 10

## 2013-06-26 MED ORDER — METHYLPREDNISOLONE ACETATE 40 MG/ML IJ SUSP
INTRAMUSCULAR | Status: AC
  Filled 2013-06-26: qty 1

## 2013-06-26 MED ORDER — LIDOCAINE HCL (CARDIAC) 20 MG/ML IV SOLN
INTRAVENOUS | Status: DC | PRN
  Administered 2013-06-26: 80 mg via INTRAVENOUS

## 2013-06-26 MED ORDER — METHYLENE BLUE 1 % INJ SOLN
INTRAMUSCULAR | Status: DC | PRN
  Administered 2013-06-26: 1 mL

## 2013-06-26 MED ORDER — LIDOCAINE HCL (CARDIAC) 20 MG/ML IV SOLN
INTRAVENOUS | Status: AC
  Filled 2013-06-26: qty 5

## 2013-06-26 MED ORDER — PROPOFOL 10 MG/ML IV BOLUS
INTRAVENOUS | Status: AC
  Filled 2013-06-26: qty 20

## 2013-06-26 MED ORDER — PROMETHAZINE HCL 25 MG/ML IJ SOLN: 6.2500 mg | INTRAMUSCULAR | Status: DC | PRN

## 2013-06-26 MED ORDER — HYDROMORPHONE HCL PF 1 MG/ML IJ SOLN
0.2500 mg | INTRAMUSCULAR | Status: DC | PRN
  Administered 2013-06-26: 13:00:00 via INTRAVENOUS

## 2013-06-26 MED ORDER — ACETAMINOPHEN 325 MG PO TABS: 650.0000 mg | ORAL_TABLET | ORAL | Status: DC | PRN

## 2013-06-26 MED ORDER — DOCUSATE SODIUM 100 MG PO CAPS
100.0000 mg | ORAL_CAPSULE | Freq: Two times a day (BID) | ORAL | Status: DC
Start: 2013-06-26 — End: 2013-06-27
  Administered 2013-06-26 – 2013-06-27 (×2): 100 mg via ORAL
  Filled 2013-06-26 (×3): qty 1

## 2013-06-26 MED ORDER — FENTANYL CITRATE 0.05 MG/ML IJ SOLN
INTRAMUSCULAR | Status: DC | PRN
  Administered 2013-06-26: 100 ug via INTRAVENOUS
  Administered 2013-06-26: 50 ug via INTRAVENOUS
  Administered 2013-06-26 (×2): 100 ug via INTRAVENOUS
  Administered 2013-06-26: 50 ug via INTRAVENOUS
  Administered 2013-06-26: 100 ug via INTRAVENOUS
  Administered 2013-06-26 (×2): 50 ug via INTRAVENOUS

## 2013-06-26 MED ORDER — PROPOFOL 10 MG/ML IV BOLUS
INTRAVENOUS | Status: DC | PRN
  Administered 2013-06-26: 200 mg via INTRAVENOUS

## 2013-06-26 MED ORDER — MENTHOL 3 MG MT LOZG: 1.0000 | LOZENGE | OROMUCOSAL | Status: DC | PRN

## 2013-06-26 MED ORDER — ONDANSETRON HCL 4 MG/2ML IJ SOLN
INTRAMUSCULAR | Status: AC
  Filled 2013-06-26: qty 2

## 2013-06-26 MED ORDER — HYDROMORPHONE HCL PF 1 MG/ML IJ SOLN
INTRAMUSCULAR | Status: AC
  Filled 2013-06-26: qty 1

## 2013-06-26 MED ORDER — BUPIVACAINE-EPINEPHRINE 0.25% -1:200000 IJ SOLN
INTRAMUSCULAR | Status: DC | PRN
  Administered 2013-06-26: 5 mL

## 2013-06-26 MED ORDER — ONDANSETRON HCL 4 MG/2ML IJ SOLN
INTRAMUSCULAR | Status: DC | PRN
  Administered 2013-06-26: 4 mg via INTRAVENOUS

## 2013-06-26 MED ORDER — ALUM & MAG HYDROXIDE-SIMETH 200-200-20 MG/5ML PO SUSP: 30.0000 mL | Freq: Four times a day (QID) | ORAL | Status: DC | PRN

## 2013-06-26 MED ORDER — PROPOFOL INFUSION 10 MG/ML OPTIME
INTRAVENOUS | Status: DC | PRN
  Administered 2013-06-26: 75 ug/kg/min via INTRAVENOUS
  Administered 2013-06-26: 100 ug/kg/min via INTRAVENOUS

## 2013-06-26 MED ORDER — MIDAZOLAM HCL 5 MG/5ML IJ SOLN
INTRAMUSCULAR | Status: DC | PRN
  Administered 2013-06-26: 2 mg via INTRAVENOUS

## 2013-06-26 MED ORDER — SODIUM CHLORIDE 0.9 % IJ SOLN: 3.0000 mL | INTRAMUSCULAR | Status: DC | PRN

## 2013-06-26 MED ORDER — CEFAZOLIN SODIUM 1-5 GM-% IV SOLN
1.0000 g | Freq: Three times a day (TID) | INTRAVENOUS | Status: AC
  Administered 2013-06-26 (×2): 1 g via INTRAVENOUS
  Filled 2013-06-26 (×2): qty 50

## 2013-06-26 MED ORDER — MIDAZOLAM HCL 2 MG/2ML IJ SOLN: 0.5000 mg | Freq: Once | INTRAMUSCULAR | Status: DC | PRN

## 2013-06-26 MED ORDER — MIDAZOLAM HCL 2 MG/2ML IJ SOLN
INTRAMUSCULAR | Status: AC
  Filled 2013-06-26: qty 2

## 2013-06-26 MED ORDER — PHENOL 1.4 % MT LIQD: 1.0000 | OROMUCOSAL | Status: DC | PRN

## 2013-06-26 MED ORDER — KETOROLAC TROMETHAMINE 30 MG/ML IJ SOLN
INTRAMUSCULAR | Status: AC
  Administered 2013-06-26: 30 mg
  Filled 2013-06-26: qty 1

## 2013-06-26 MED ORDER — THROMBIN 20000 UNITS EX SOLR
OROMUCOSAL | Status: DC | PRN
  Administered 2013-06-26: 09:00:00 via TOPICAL

## 2013-06-26 MED ORDER — SODIUM CHLORIDE 0.9 % IJ SOLN
3.0000 mL | Freq: Two times a day (BID) | INTRAMUSCULAR | Status: DC
  Administered 2013-06-26 (×2): 3 mL via INTRAVENOUS

## 2013-06-26 MED ORDER — CEFAZOLIN SODIUM-DEXTROSE 2-3 GM-% IV SOLR
INTRAVENOUS | Status: AC
  Filled 2013-06-26: qty 50

## 2013-06-26 MED ORDER — THROMBIN 20000 UNITS EX SOLR
CUTANEOUS | Status: AC
  Filled 2013-06-26: qty 20000

## 2013-06-26 SURGICAL SUPPLY — 73 items
APL SKNCLS STERI-STRIP NONHPOA (GAUZE/BANDAGES/DRESSINGS) ×1
BENZOIN TINCTURE PRP APPL 2/3 (GAUZE/BANDAGES/DRESSINGS) ×1 IMPLANT
BLADE 10 SAFETY STRL DISP (BLADE) ×2 IMPLANT
BUR ROUND PRECISION 4.0 (BURR) ×2 IMPLANT
CANISTER SUCTION 2500CC (MISCELLANEOUS) ×2 IMPLANT
CARTRIDGE OIL MAESTRO DRILL (MISCELLANEOUS) ×1 IMPLANT
CORDS BIPOLAR (ELECTRODE) ×2 IMPLANT
COVER SURGICAL LIGHT HANDLE (MISCELLANEOUS) ×2 IMPLANT
DIFFUSER DRILL AIR PNEUMATIC (MISCELLANEOUS) ×2 IMPLANT
DRAIN CHANNEL 15F RND FF W/TCR (WOUND CARE) ×1 IMPLANT
DRAPE POUCH INSTRU U-SHP 10X18 (DRAPES) ×4 IMPLANT
DRAPE SURG 17X23 STRL (DRAPES) ×8 IMPLANT
DURAPREP 26ML APPLICATOR (WOUND CARE) ×2 IMPLANT
ELECT BLADE 4.0 EZ CLEAN MEGAD (MISCELLANEOUS) ×2
ELECT CAUTERY BLADE 6.4 (BLADE) ×2 IMPLANT
ELECT REM PT RETURN 9FT ADLT (ELECTROSURGICAL) ×2
ELECTRODE BLDE 4.0 EZ CLN MEGD (MISCELLANEOUS) IMPLANT
ELECTRODE REM PT RTRN 9FT ADLT (ELECTROSURGICAL) ×1 IMPLANT
EVACUATOR SILICONE 100CC (DRAIN) ×1 IMPLANT
FILTER STRAW FLUID ASPIR (MISCELLANEOUS) ×2 IMPLANT
GAUZE SPONGE 4X4 16PLY XRAY LF (GAUZE/BANDAGES/DRESSINGS) ×4 IMPLANT
GLOVE BIO SURGEON STRL SZ7 (GLOVE) ×2 IMPLANT
GLOVE BIO SURGEON STRL SZ8 (GLOVE) ×2 IMPLANT
GLOVE BIOGEL PI IND STRL 7.0 (GLOVE) ×1 IMPLANT
GLOVE BIOGEL PI IND STRL 8 (GLOVE) ×1 IMPLANT
GLOVE BIOGEL PI INDICATOR 7.0 (GLOVE) ×1
GLOVE BIOGEL PI INDICATOR 8 (GLOVE) ×1
GOWN STRL REUS W/ TWL LRG LVL3 (GOWN DISPOSABLE) ×1 IMPLANT
GOWN STRL REUS W/ TWL XL LVL3 (GOWN DISPOSABLE) ×2 IMPLANT
GOWN STRL REUS W/TWL LRG LVL3 (GOWN DISPOSABLE) ×2
GOWN STRL REUS W/TWL XL LVL3 (GOWN DISPOSABLE) ×4
IV CATH 14GX2 1/4 (CATHETERS) ×2 IMPLANT
KIT BASIN OR (CUSTOM PROCEDURE TRAY) ×2 IMPLANT
KIT POSITION SURG JACKSON T1 (MISCELLANEOUS) ×2 IMPLANT
KIT ROOM TURNOVER OR (KITS) ×2 IMPLANT
NDL 18GX1X1/2 (RX/OR ONLY) (NEEDLE) ×1 IMPLANT
NDL HYPO 25GX1X1/2 BEV (NEEDLE) ×1 IMPLANT
NDL SPNL 18GX3.5 QUINCKE PK (NEEDLE) ×2 IMPLANT
NEEDLE 18GX1X1/2 (RX/OR ONLY) (NEEDLE) ×2 IMPLANT
NEEDLE 22X1 1/2 (OR ONLY) (NEEDLE) ×2 IMPLANT
NEEDLE HYPO 25GX1X1/2 BEV (NEEDLE) ×2 IMPLANT
NEEDLE SPNL 18GX3.5 QUINCKE PK (NEEDLE) ×4 IMPLANT
NEURO MONITORING STIM (LABOR (TRAVEL & OVERTIME)) ×1 IMPLANT
NS IRRIG 1000ML POUR BTL (IV SOLUTION) ×2 IMPLANT
OIL CARTRIDGE MAESTRO DRILL (MISCELLANEOUS) ×2
PACK LAMINECTOMY ORTHO (CUSTOM PROCEDURE TRAY) ×2 IMPLANT
PACK UNIVERSAL I (CUSTOM PROCEDURE TRAY) ×2 IMPLANT
PAD ARMBOARD 7.5X6 YLW CONV (MISCELLANEOUS) ×4 IMPLANT
PATTIES SURGICAL .5 X.5 (GAUZE/BANDAGES/DRESSINGS) IMPLANT
PATTIES SURGICAL .5 X1 (DISPOSABLE) ×2 IMPLANT
SPONGE GAUZE 4X4 12PLY (GAUZE/BANDAGES/DRESSINGS) ×2 IMPLANT
SPONGE INTESTINAL PEANUT (DISPOSABLE) ×2 IMPLANT
SPONGE SURGIFOAM ABS GEL 100 (HEMOSTASIS) ×2 IMPLANT
SPONGE SURGIFOAM ABS GEL SZ50 (HEMOSTASIS) ×2 IMPLANT
STRIP CLOSURE SKIN 1/2X4 (GAUZE/BANDAGES/DRESSINGS) ×1 IMPLANT
SURGIFLO TRUKIT (HEMOSTASIS) IMPLANT
SUT MNCRL AB 4-0 PS2 18 (SUTURE) ×2 IMPLANT
SUT VIC AB 0 CT1 18XCR BRD 8 (SUTURE) IMPLANT
SUT VIC AB 0 CT1 27 (SUTURE)
SUT VIC AB 0 CT1 27XBRD ANBCTR (SUTURE) IMPLANT
SUT VIC AB 0 CT1 8-18 (SUTURE) ×2
SUT VIC AB 1 CT1 18XCR BRD 8 (SUTURE) ×1 IMPLANT
SUT VIC AB 1 CT1 8-18 (SUTURE) ×2
SUT VIC AB 2-0 CT2 18 VCP726D (SUTURE) ×2 IMPLANT
SYR 20CC LL (SYRINGE) IMPLANT
SYR BULB IRRIGATION 50ML (SYRINGE) ×2 IMPLANT
SYR CONTROL 10ML LL (SYRINGE) ×4 IMPLANT
SYR TB 1ML 26GX3/8 SAFETY (SYRINGE) ×4 IMPLANT
SYR TB 1ML LUER SLIP (SYRINGE) ×4 IMPLANT
TOWEL OR 17X24 6PK STRL BLUE (TOWEL DISPOSABLE) ×2 IMPLANT
TOWEL OR 17X26 10 PK STRL BLUE (TOWEL DISPOSABLE) ×2 IMPLANT
WATER STERILE IRR 1000ML POUR (IV SOLUTION) ×2 IMPLANT
YANKAUER SUCT BULB TIP NO VENT (SUCTIONS) ×2 IMPLANT

## 2013-06-26 NOTE — Anesthesia Preprocedure Evaluation (Signed)
Anesthesia Evaluation  Patient identified by MRN, date of birth, ID band Patient awake    Reviewed: Allergy & Precautions, H&P , Patient's Chart, lab work & pertinent test results, reviewed documented beta blocker date and time   History of Anesthesia Complications Negative for: history of anesthetic complications  Airway Mallampati: II TM Distance: >3 FB Neck ROM: full    Dental   Pulmonary pneumonia -, resolved,  breath sounds clear to auscultation        Cardiovascular Exercise Tolerance: Good hypertension, Rhythm:regular Rate:Normal     Neuro/Psych    GI/Hepatic GERD-  Controlled,  Endo/Other    Renal/GU      Musculoskeletal   Abdominal   Peds  Hematology   Anesthesia Other Findings   Reproductive/Obstetrics                           Anesthesia Physical Anesthesia Plan  ASA: II  Anesthesia Plan: General ETT   Post-op Pain Management:    Induction:   Airway Management Planned:   Additional Equipment:   Intra-op Plan:   Post-operative Plan:   Informed Consent: I have reviewed the patients History and Physical, chart, labs and discussed the procedure including the risks, benefits and alternatives for the proposed anesthesia with the patient or authorized representative who has indicated his/her understanding and acceptance.   Dental Advisory Given  Plan Discussed with: CRNA and Surgeon  Anesthesia Plan Comments:         Anesthesia Quick Evaluation

## 2013-06-26 NOTE — Plan of Care (Signed)
Problem: Consults Goal: Diagnosis - Spinal Surgery Outcome: Completed/Met Date Met:  06/26/13 Lumbar Laminectomy (Complex)

## 2013-06-26 NOTE — Anesthesia Procedure Notes (Signed)
Procedure Name: Intubation Date/Time: 06/26/2013 8:31 AM Performed by: Ollen Bowl Pre-anesthesia Checklist: Patient identified, Timeout performed, Emergency Drugs available, Suction available and Patient being monitored Patient Re-evaluated:Patient Re-evaluated prior to inductionOxygen Delivery Method: Circle system utilized and Simple face mask Preoxygenation: Pre-oxygenation with 100% oxygen Intubation Type: IV induction Ventilation: Mask ventilation without difficulty Laryngoscope Size: Mac and 4 Grade View: Grade II Tube type: Oral Tube size: 7.5 mm Number of attempts: 1 Airway Equipment and Method: Patient positioned with wedge pillow and Stylet Placement Confirmation: ETT inserted through vocal cords under direct vision,  positive ETCO2 and breath sounds checked- equal and bilateral Secured at: 23 cm Tube secured with: Tape Dental Injury: Teeth and Oropharynx as per pre-operative assessment

## 2013-06-26 NOTE — Transfer of Care (Signed)
Immediate Anesthesia Transfer of Care Note  Patient: Anthony Miller  Procedure(s) Performed: Procedure(s): LUMBAR LAMINECTOMY/DECOMPRESSION MICRODISCECTOMY (N/A)  Patient Location: PACU  Anesthesia Type:General  Level of Consciousness: sedated  Airway & Oxygen Therapy: Patient Spontanous Breathing and Patient connected to face mask oxygen  Post-op Assessment: Report given to PACU RN, Post -op Vital signs reviewed and stable and Patient moving all extremities X 4  Post vital signs: Reviewed and stable  Complications: No apparent anesthesia complications

## 2013-06-27 ENCOUNTER — Encounter (HOSPITAL_COMMUNITY): Payer: Self-pay | Admitting: Orthopedic Surgery

## 2013-06-27 DIAGNOSIS — M5137 Other intervertebral disc degeneration, lumbosacral region: Secondary | ICD-10-CM | POA: Diagnosis not present

## 2013-06-27 DIAGNOSIS — M545 Low back pain, unspecified: Secondary | ICD-10-CM | POA: Diagnosis not present

## 2013-06-27 MED ORDER — OXYCODONE-ACETAMINOPHEN 5-325 MG PO TABS: 1.0000 | ORAL_TABLET | Freq: Four times a day (QID) | ORAL | Status: DC | PRN

## 2013-06-27 MED ORDER — DIAZEPAM 5 MG PO TABS: 5.0000 mg | ORAL_TABLET | Freq: Four times a day (QID) | ORAL | Status: DC | PRN

## 2013-06-27 NOTE — Evaluation (Signed)
Occupational Therapy Evaluation Patient Details Name: Anthony Miller MRN: [id number] DOB: April 01, 1955 Today's Date: 06/27/2013    History of Present Illness s/p L 4-5 diskectomy/decompression    Clinical Impression   Pt is performing ADL and ADL transfers at a modified independent level.  Instructed pt and wife in back precautions at length with regard to self care, IADL, when using RW, and transferring in and out of car.  Pt verbalized understanding of all.  Pt finds RW beneficial for pain relief, recommending for discharge.    Follow Up Recommendations  No OT follow up;Supervision - Intermittent    Equipment Recommendations   Rolling walker    Recommendations for Other Services       Precautions / Restrictions Precautions Precautions: Back Precaution Booklet Issued: Yes (comment) Precaution Comments: reviewed back precautions and provided handout Restrictions Weight Bearing Restrictions: No      Mobility Bed Mobility Overal bed mobility: Modified Independent             General bed mobility comments: uses bed rails and log roll technique  Transfers Overall transfer level: Modified independent Equipment used: Rolling walker (2 wheeled) Transfers: Sit to/from Stand Sit to Stand: Modified independent (Device/Increase time)         General transfer comment: with RW    Balance                                            ADL Overall ADL's : Modified independent                                       General ADL Comments: Pt is able to perform LB ADL by crossing his foot over his opposite knee.  He can perform toilet transfers at modified independent level. Instructed in toothbrushing, avoiding bending.  Instructed to avoid lifting and in body mechanics for picking items up from floor.     Vision                     Perception     Praxis      Pertinent Vitals/Pain VSS, premedicated, soreness in back      Hand Dominance Right   Extremity/Trunk Assessment Upper Extremity Assessment Upper Extremity Assessment: Overall WFL for tasks assessed   Lower Extremity Assessment Lower Extremity Assessment: Defer to PT evaluation       Communication Communication Communication: No difficulties   Cognition Arousal/Alertness: Awake/alert Behavior During Therapy: WFL for tasks assessed/performed Overall Cognitive Status: Within Functional Limits for tasks assessed                     General Comments       Exercises       Shoulder Instructions      Home Living Family/patient expects to be discharged to:: Private residence Living Arrangements: Spouse/significant other;Children Available Help at Discharge: Family Type of Home: House Home Access: Stairs to enter Technical brewer of Steps: 3   Home Layout: Able to live on main level with bedroom/bathroom     Bathroom Shower/Tub: Teacher, early years/pre: Standard     Home Equipment: None          Prior Functioning/Environment Level of Independence: Independent        Comments:  works for [employer] home    OT Diagnosis:     OT Problem List:     OT Treatment/Interventions:      OT Goals(Current goals can be found in the care plan section) Acute Rehab OT Goals Patient Stated Goal: less pain  OT Frequency:     Barriers to D/C:            Co-evaluation              End of Session Equipment Utilized During Treatment: Rolling walker Nurse Communication:  (needs RW)  Activity Tolerance: Patient tolerated treatment well Patient left: in bed;with call bell/phone within reach;with family/visitor present   Time: 0569-7948 OT Time Calculation (min): 22 min Charges:  OT General Charges $OT Visit: 1 Procedure OT Evaluation $Initial OT Evaluation Tier I: 1 Procedure OT Treatments $Self Care/Home Management : 8-22 mins G-Codes: OT G-codes **NOT FOR INPATIENT CLASS** Functional  Assessment Tool Used: clinical judgement Functional Limitation: Self care Self Care Current Status (A1655): At least 1 percent but less than 20 percent impaired, limited or restricted Self Care Goal Status (V7482): At least 1 percent but less than 20 percent impaired, limited or restricted Self Care Discharge Status [phone]): At least 1 percent but less than 20 percent impaired, limited or restricted  Anthony Miller 06/27/2013, 9:12 AM [phone]

## 2013-06-27 NOTE — Progress Notes (Signed)
Physical Therapy Treatment/ Re-eval Patient Details Name: Anthony Miller MRN: [id number] DOB: 05/29/1955 Today's Date: 06/27/2013    History of Present Illness s/p L 4-5 diskectomy/decompression     PT Comments    Pt with greatly improved function able to walk and transfer with cues and no physical assist today. Pt with difficulty recalling all precautions but wife very aware and assisting with cues to maintain posture and safety. Pt with 5/5 LLE myotomes, 4/5 hip flexion and knee extension on RLE with sensation equal and WFL bilaterally. Pt and spouse educated for all restrictions, mobility, function and stairs. Will follow acutely if pt does not DC today but safe for return home.   Follow Up Recommendations  Outpatient PT (when cleared by MD if pain and RLE weakness persist)     Equipment Recommendations  Rolling walker with 5" wheels    Recommendations for Other Services       Precautions / Restrictions Precautions Precautions: Back    Mobility  Bed Mobility Overal bed mobility: Needs Assistance Bed Mobility: Rolling;Sidelying to Sit Rolling: Supervision Sidelying to sit: Supervision       General bed mobility comments: no rail with supervision to perform safely with precautions  Transfers Overall transfer level: Needs assistance   Transfers: Sit to/from Stand Sit to Stand: Supervision         General transfer comment: cues for hand placement, safety and no twisting  Ambulation/Gait Ambulation/Gait assistance: Supervision Ambulation Distance (Feet): 350 Feet Assistive device: Rolling walker (2 wheeled) Gait Pattern/deviations: Step-through pattern;Decreased stride length   Gait velocity interpretation: Below normal speed for age/gender General Gait Details: cues for posture and position in RW   Stairs Stairs: Yes Stairs assistance: Min guard Stair Management: No rails;Backwards Number of Stairs: 3 General stair comments: pt ascended backward with  wifes shoulders for support  Wheelchair Mobility    Modified Rankin (Stroke Patients Only)       Balance                                    Cognition Arousal/Alertness: Awake/alert Behavior During Therapy: WFL for tasks assessed/performed Overall Cognitive Status: Within Functional Limits for tasks assessed                      Exercises      General Comments        Pertinent Vitals/Pain No pain with gait, twinge of pain with transitional movements unrated    Home Living                      Prior Function            PT Goals (current goals can now be found in the care plan section) Progress towards PT goals: Progressing toward goals    Frequency  Min 5X/week    PT Plan Frequency needs to be updated;Current plan remains appropriate    Co-evaluation             End of Session   Activity Tolerance: Patient tolerated treatment well Patient left: in bed;with call bell/phone within reach;with family/visitor present     Time: 1236-1310 PT Time Calculation (min): 34 min  Charges:  $Gait Training: 8-22 mins                    G Codes:  Functional Assessment Tool Used: clinical judgement Functional  Limitation: Mobility: Walking and moving around Mobility: Walking and Moving Around Current Status [phone]): At least 1 percent but less than 20 percent impaired, limited or restricted Mobility: Walking and Moving Around Goal Status [phone]): 0 percent impaired, limited or restricted   Regis Hinton B Donika Butner 06/27/2013, 1:16 PM Elwyn Reach, [place]

## 2013-06-27 NOTE — Progress Notes (Signed)
Pt doing well. Pt and wife given D/C instructions with Rx's, verbal understanding was given. Pt's IV was removed prior to D/C. Pt D/C'd home via wheelchair @ 1445 per MD order. Pt has rolling walker at home. Pt ws stable @ D/C and has no other needs. Holli Humbles, RN

## 2013-06-27 NOTE — Op Note (Signed)
NAMEWARIS, RODGER NO.:  [id number]  MEDICAL RECORD NO.:  [id number]  LOCATION:  8L38B                        FACILITY:  [facility]  PHYSICIAN:  Phylliss Bob, MD      DATE OF BIRTH:  1955/04/06  DATE OF PROCEDURE:  06/26/2013                              OPERATIVE REPORT   PREOPERATIVE DIAGNOSES: 1. Severe right L4 radiculopathy. 2. 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  POSTOPERATIVE DIAGNOSES: 1. Severe right L4 radiculopathy. 2. 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  PROCEDURE:  Right-sided L4-5 foraminal diskectomy with transpedicular decompression.  SURGEON:  Phylliss Bob, MD  ASSISTANT:  Pricilla Holm, PA-C  ANESTHESIA:  General endotracheal anesthesia.  COMPLICATIONS:  None.  DISPOSITION:  Stable.  ESTIMATED BLOOD LOSS:  Minimal.  INDICATIONS FOR PROCEDURE:  Briefly, Mr. Studley is an extremely pleasant 58 year old male who was admitted to the hospital with severe debilitating pain in the right leg.  An MRI was obtained and notable for a protrusion at L2-3, and a herniation on the right in the foraminal region at L4-5.  It was unclear as to whether his pain was related to the L2-3 protrusion, versus the L4-5 protrusion.  Right L3 nerve block was obtained, but the patient reported only very minimal benefit with the block.  I did feel it was very likely that the patient's pain was due to L4 radiculopathy secondary to the foraminal right L4-5 disk herniation.  We therefore did discuss proceeding with a transpedicular decompression via [place] approach.  Of particular note, I did explain to the patient that foraminal disk herniations causing severe pain and weakness do tend to result in residual  symptomatology, which may include ongoing pain, numbness, or weakness.  The patient did clearly understand this prior to surgery.  The patient did also understand the additional risks of surgery, including infection, nerve injury, cerebrospinal fluid leak, as well as recurrent disk herniation, and instability.  Again, the patient did understand that he may have residual symptoms.  OPERATIVE DETAILS:  On Jun 26, 2013, patient was brought to surgery and general endotracheal anesthesia was administered.  The patient was placed prone on a well-padded flat Jackson bed with a Wilson frame. Antibiotics were given.  Neurologic monitoring was utilized throughout the surgery.  Of note, there was no sustained abnormal EMG activity noted throughout surgery.  I did make a 0.5 inch incision overlying the L4-5 interspace on the right side.  The inner transverse process membrane was identified and removed.  The exiting L3 nerve was identified, and clearly noted to be under substantial and significant pressure.  I was able to gently retract the nerve superiorly and laterally.  Three very large disk fragments were readily appreciated, and uneventfully removed using a micropituitary.  Of note, the nerve itself was noted to be extremely erythematous.  The dorsal root ganglion was particularly noted to be erythematous and swollen.  It was  very clear that the herniated fragment was causing substantial pressure on the dorsum root ganglion and the exiting L4 nerve.  I did continue to explore the foraminal region, and there were minor other disk fragments that were removed using a micropituitary rongeur.  At the termination of this portion of the procedure, I was easily able to mobilize the nerve medially and laterally without any undue compression noted.  I then was able to control minor areas of retroperitoneal bleeding using bipolar electrocautery and Gelfoam.  The wound was then copiously irrigated.  I then  introduced 40 mg Depo-Medrol in the region of the exiting L4 nerve. I then closed the fascia using #1 Vicryl.  The subcutaneous layer was closed using 2-0 Vicryl followed by 3-0 Monocryl.  Benzoin and Steri- Strips were applied followed by sterile dressing.  All instrument counts were correct at the termination of the procedure.  Pricilla Holm was my assistant throughout surgery, and did aid in retraction, suctioning, and closure.     Phylliss Bob, MD     MD/MEDQ  D:  06/26/2013  T:  06/27/2013  Job:  [id number]  cc:   Alta Corning, M.D.

## 2013-06-27 NOTE — Progress Notes (Signed)
    Patient doing well s/p L4/5 decompression right pain is resolved.   Physical Exam: Filed Vitals:   06/27/13 0802  BP: 124/66  Pulse: 68  Temp: 98.5 F (36.9 C)  Resp: 18    Dressing in place NVI with improved right quad strength Patient up and ambulating with OT  POD #1 s/p L4/5 transpedicular decompression  - encourage ambulation - Percocet for pain, Valium for muscle spasms - d/c home today

## 2013-06-27 NOTE — Anesthesia Postprocedure Evaluation (Signed)
  Anesthesia Post Note  Patient: Anthony Miller  Procedure(s) Performed: Procedure(s) (LRB): LUMBAR LAMINECTOMY/DECOMPRESSION MICRODISCECTOMY (N/A)  Anesthesia type: GA  Patient location: PACU  Post pain: Pain level controlled  Post assessment: Post-op Vital signs reviewed  Last Vitals:  Filed Vitals:   06/27/13 1242  BP: 122/81  Pulse: 65  Temp: 36.7 C  Resp: 18    Post vital signs: Reviewed  Level of consciousness: sedated  Complications: No apparent anesthesia complications

## 2013-07-10 NOTE — Discharge Summary (Signed)
Patient ID: Anthony Miller MRN: [id number] DOB/AGE: 1956/01/20 58 y.o.  Admit date: 06/21/2013 Discharge date: 06/27/13  Admission Diagnoses:  Principal Problem:   Intractable low back pain Active Problems:   GERD   Intractable back pain   HTN (hypertension)   Radiculopathy   Discharge Diagnoses:  Same  Past Medical History  Diagnosis Date  . Hypertension   . GERD (gastroesophageal reflux disease)   . Pneumonia 1970's; ~ 2010  . Arthritis     "lower back/pelvic region" (06/21/2013)    Surgeries: Procedure(s): L4-5 LUMBAR LAMINECTOMY/DECOMPRESSION MICRODISCECTOMY on 06/21/2013 - 06/26/2013   Consultants:   None  Discharged Condition: Improved  Hospital Course: Anthony Miller is an 58 y.o. male who was admitted 06/21/2013 for operative treatment of Radiculopathy. Patient has severe unremitting pain that affects sleep, daily activities, and work/hobbies. After pre-op clearance the patient was taken to the operating room on 06/21/2013 - 06/26/2013 and underwent  Procedure(s): LUMBAR LAMINECTOMY/DECOMPRESSION MICRODISCECTOMY L4-5.    Patient was given perioperative antibiotics:  Anti-infectives   Start     Dose/Rate Route Frequency Ordered Stop   06/26/13 1700  ceFAZolin (ANCEF) IVPB 1 g/50 mL premix     1 g 100 mL/hr over 30 Minutes Intravenous Every 8 hours 06/26/13 1526 06/27/13 0011       Patient was given sequential compression devices, early ambulation to prevent DVT.  Patient benefited maximally from hospital stay and there were no complications.    Recent vital signs: BP 122/81  Pulse 65  Temp(Src) 98 F (36.7 C) (Oral)  Resp 18  Ht 6\' 1"  (1.854 m)  Wt 111.358 kg (245 lb 8 oz)  BMI 32.40 kg/m2  SpO2 98%   Discharge Medications:     Medication List    STOP taking these medications       FISH OIL PO     HYDROcodone-acetaminophen 5-325 MG per tablet  Commonly known as:  NORCO/VICODIN     predniSONE 10 MG tablet  Commonly known as:  DELTASONE       TAKE these medications       diazepam 5 MG tablet  Commonly known as:  VALIUM  Take 1 tablet (5 mg total) by mouth every 6 (six) hours as needed for muscle spasms.     escitalopram 20 MG tablet  Commonly known as:  LEXAPRO  Take 20 mg by mouth at bedtime.     lisinopril 10 MG tablet  Commonly known as:  PRINIVIL,ZESTRIL  Take 10 mg by mouth daily.     multivitamin with minerals Tabs tablet  Take 1 tablet by mouth daily.     oxyCODONE-acetaminophen 5-325 MG per tablet  Commonly known as:  PERCOCET/ROXICET  Take 1-2 tablets by mouth every 6 (six) hours as needed for severe pain.        Diagnostic Studies: Dg Lumbar Spine 2-3 Views  06/26/2013   CLINICAL DATA:  Lumbar laminectomy, microdiskectomy and decompression  EXAM: LUMBAR SPINE - 2-3 VIEW  COMPARISON:  MR L SPINE W/O dated 06/21/2013  FINDINGS: Three spot intraoperative radiographic images of the lumbar spine are provided for review. Spinal labeling is in keeping with preprocedural lumbar spine MRI.  Radiograph labeled #1 demonstrates radiopaque marking instrument tips overlying the L1 spinous process as well as the soft tissues posterior to the L2-L3 intervertebral disc space.  Radiograph labeled #2 demonstrates radiopaque marking instrument overlying the soft tissues posterior to the L1 and L5 spinous processes as well as the soft tissues posterior  to the L2-L3 intervertebral disc space.  Radiograph labeled #3 demonstrates radiopaque marking instrument overlying the soft tissues posterior to the L4 vertebral body. Additional radiopaque surgical support apparatus is seen posterior to the L4-L5 intervertebral disc space.  IMPRESSION: Intraoperative localization as above.   Electronically Signed   By: Sandi Mariscal M.D.   On: 06/26/2013 13:10   Mr Lumbar Spine Wo Contrast  06/21/2013   CLINICAL DATA:  Low back pain  EXAM: MRI LUMBAR SPINE WITHOUT CONTRAST  TECHNIQUE: Multiplanar, multisequence MR imaging of the lumbar spine was  performed. No intravenous contrast was administered.  COMPARISON:  None.  FINDINGS: The lowest lumbar type non-rib-bearing vertebra is labeled as L5. The conus medullaris appears normal. Conus level: L1-2.  Loss of disc height at the L2-3 level with type 2 degenerative endplate findings. Hemangioma noted eccentric to the right in the L3 vertebral body.  Possible Schmorl's node along the posterior superior endplate of L3. Hemangioma noted eccentric to the left in the L4 vertebral body.  Additional findings at individual levels are as follows:  L1-2: Unremarkable.  L2-3: Mild central narrowing of the thecal sac mild right subarticular lateral recess stenosis due to right paracentral disc protrusion, disc bulge, and posterior osseous ridging.  L3-4:  Unremarkable.  L4-5: Moderate right foraminal stenosis due to right foraminal disc protrusion.  L5-S1:  Mild left foraminal stenosis due to facet spurring.  IMPRESSION: 1. 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 2. Spondylosis and degenerative disc disease.   Electronically Signed   By: Sherryl Barters M.D.   On: 06/21/2013 15:51   Ir Epidurography  06/24/2013   CLINICAL DATA:  Symptomatic right L3 radiculopathy. MRI shows disc space narrowing with central and rightward protrusion at the L2-L3 level. Lumbosacral spondylosis without myelopathy.  EXAM: EPIDUROGRAPHY/NERVE ROOT  PROCEDURE: The procedure, risks, benefits, and alternatives were explained to the patient. Questions regarding the procedure were encouraged and answered. The patient understands and consents to the procedure.  A posterior oblique approach was taken to the intervertebral foramen on the right at the inferior L3 pedicle using a curved 22 gauge spinal needle. Injection of Omnipaque 180 outlined the right L3 nerve root and showed good cephalad epidural spread to the L2-L3 disc space. No vascular opacification is seen. 120.0mg  of Depo-Medrol  mixed with 3 mL 1% lidocaine were instilled. The procedure was well-tolerated, and the patient was returned to the floor in good condition. Patient reported immediate improvement in symptoms.  COMPARISON:  MRI lumbar spine 06/21/2013  IMPRESSION: Technically successful injection consisting of a right L3 nerve root block and transforaminal epidural.   Electronically Signed   By: Rolla Flatten M.D.   On: 06/24/2013 14:35    Disposition: 01-Home or Self Care      Discharge Instructions   Discharge patient    Complete by:  As directed            - encourage ambulation  - Percocet for pain, Valium for muscle spasms  - d/c home today - F/U in office 2 weeks    Signed: Justice Britain 07/10/2013, 2:47 PM

## 2013-07-17 ENCOUNTER — Ambulatory Visit: Payer: BC Managed Care – PPO

## 2013-07-30 ENCOUNTER — Encounter: Payer: BC Managed Care – PPO | Admitting: Rehabilitation

## 2013-07-31 ENCOUNTER — Encounter: Payer: BC Managed Care – PPO | Admitting: Rehabilitation

## 2013-08-05 ENCOUNTER — Encounter: Payer: BC Managed Care – PPO | Admitting: Rehabilitation

## 2013-08-08 ENCOUNTER — Ambulatory Visit: Payer: BC Managed Care – PPO | Admitting: Physical Therapy

## 2013-08-09 NOTE — OR Nursing (Signed)
Addendum to scope page 

## 2014-07-29 ENCOUNTER — Other Ambulatory Visit: Payer: Self-pay | Admitting: Family Medicine

## 2014-08-29 ENCOUNTER — Other Ambulatory Visit: Payer: Self-pay | Admitting: Family Medicine

## 2014-10-20 ENCOUNTER — Other Ambulatory Visit: Payer: Self-pay | Admitting: Family Medicine

## 2014-11-19 ENCOUNTER — Other Ambulatory Visit: Payer: Self-pay | Admitting: Family Medicine

## 2014-12-27 ENCOUNTER — Other Ambulatory Visit: Payer: Self-pay | Admitting: Family Medicine

## 2015-01-01 ENCOUNTER — Other Ambulatory Visit: Payer: Self-pay | Admitting: Family Medicine

## 2015-01-19 ENCOUNTER — Ambulatory Visit: Payer: Self-pay | Admitting: Family Medicine

## 2015-04-01 ENCOUNTER — Other Ambulatory Visit: Payer: Self-pay | Admitting: Family Medicine

## 2015-05-09 ENCOUNTER — Other Ambulatory Visit: Payer: Self-pay | Admitting: Family Medicine

## 2015-06-07 ENCOUNTER — Other Ambulatory Visit: Payer: Self-pay | Admitting: Family Medicine

## 2015-06-17 ENCOUNTER — Other Ambulatory Visit: Payer: Self-pay | Admitting: Family Medicine

## 2015-08-18 ENCOUNTER — Other Ambulatory Visit: Payer: Self-pay | Admitting: Family Medicine

## 2015-08-25 NOTE — Telephone Encounter (Signed)
I may be missing something, but I can't find the patient's last visit here at [facility]; he did not keep an appt in December 2016 I looked in the old system, and I don't see anything for over 2 years I don't see any creatinine or potassium for the last 2 years either Please verify when patient was last seen for me; if he's not physically been seen in over two years, I have trouble approving medicine

## 2015-08-26 NOTE — Telephone Encounter (Signed)
Called lvm for patient to call our office regarding the refill request.  Once he calls back we will need to schedule an appointment with Dr. Sanda Klein due to patient not being seen in over 2 years.

## 2015-08-26 NOTE — Telephone Encounter (Signed)
Thank you for checking on this; I'm not comfortable refilling medicine for a patient who hasn't been seen in two years; we'll look forward to having him get back in and being evaluated and getting labs checked

## 2015-09-29 ENCOUNTER — Other Ambulatory Visit: Payer: Self-pay | Admitting: Family Medicine

## 2015-09-29 ENCOUNTER — Telehealth: Payer: Self-pay | Admitting: Family Medicine

## 2015-09-29 NOTE — Telephone Encounter (Signed)
Please call in one week

## 2015-09-29 NOTE — Telephone Encounter (Signed)
Patient is scheduled to see Dr. Ancil Boozer on 10/06/15 for a refill on his Lisinopril 10mg  medication.  Patient would like to get enough Lisinopril to last until his appointment.  Patient use the [facility] on [address] in [place].  Please call patient once complete.

## 2015-09-30 NOTE — Telephone Encounter (Signed)
Called 1 week of Lisinopril 10 mg into [facility] and patient has been notified.

## 2015-10-06 ENCOUNTER — Ambulatory Visit: Payer: Self-pay | Admitting: Family Medicine

## 2015-10-07 ENCOUNTER — Ambulatory Visit (INDEPENDENT_AMBULATORY_CARE_PROVIDER_SITE_OTHER): Payer: 59 | Admitting: Family Medicine

## 2015-10-07 ENCOUNTER — Encounter: Payer: Self-pay | Admitting: Family Medicine

## 2015-10-07 VITALS — BP 124/84 | HR 103 | Temp 98.2°F | Resp 18 | Ht 71.0 in | Wt 256.4 lb

## 2015-10-07 DIAGNOSIS — Z8719 Personal history of other diseases of the digestive system: Secondary | ICD-10-CM | POA: Diagnosis not present

## 2015-10-07 DIAGNOSIS — R1310 Dysphagia, unspecified: Secondary | ICD-10-CM

## 2015-10-07 DIAGNOSIS — K589 Irritable bowel syndrome without diarrhea: Secondary | ICD-10-CM

## 2015-10-07 DIAGNOSIS — K219 Gastro-esophageal reflux disease without esophagitis: Secondary | ICD-10-CM | POA: Diagnosis not present

## 2015-10-07 DIAGNOSIS — E785 Hyperlipidemia, unspecified: Secondary | ICD-10-CM

## 2015-10-07 DIAGNOSIS — Z131 Encounter for screening for diabetes mellitus: Secondary | ICD-10-CM

## 2015-10-07 DIAGNOSIS — Z125 Encounter for screening for malignant neoplasm of prostate: Secondary | ICD-10-CM | POA: Diagnosis not present

## 2015-10-07 DIAGNOSIS — Z1159 Encounter for screening for other viral diseases: Secondary | ICD-10-CM

## 2015-10-07 DIAGNOSIS — I10 Essential (primary) hypertension: Secondary | ICD-10-CM | POA: Diagnosis not present

## 2015-10-07 DIAGNOSIS — Z23 Encounter for immunization: Secondary | ICD-10-CM

## 2015-10-07 DIAGNOSIS — R5383 Other fatigue: Secondary | ICD-10-CM | POA: Diagnosis not present

## 2015-10-07 DIAGNOSIS — G4700 Insomnia, unspecified: Secondary | ICD-10-CM

## 2015-10-07 MED ORDER — AMITRIPTYLINE HCL 50 MG PO TABS: 50.0000 mg | ORAL_TABLET | Freq: Every day | ORAL | 3 refills | Status: DC

## 2015-10-07 MED ORDER — OMEPRAZOLE 20 MG PO CPDR: 20.0000 mg | DELAYED_RELEASE_CAPSULE | Freq: Every day | ORAL | 3 refills | Status: AC

## 2015-10-07 MED ORDER — ASPIRIN EC 81 MG PO TBEC: 81.0000 mg | DELAYED_RELEASE_TABLET | Freq: Every day | ORAL | 0 refills | Status: DC

## 2015-10-07 MED ORDER — LISINOPRIL 10 MG PO TABS: 10.0000 mg | ORAL_TABLET | Freq: Every day | ORAL | 3 refills | Status: DC

## 2015-10-07 NOTE — Progress Notes (Signed)
Name: Anthony Miller   MRN: [id number]    DOB: 1955/02/16   Date:10/07/2015       Progress Note  Subjective  Chief Complaint  Chief Complaint  Patient presents with  . Medication Refill  . Hypertension    HPI  HTN: he has been taking lisinopril daily, he denies chest pain, but he has occasional palpitation when he is very active but no SOB. He is able to go up two flight of stairs.   Dyslipidemia: low HDL on labs done in 2015. Discussed ways to improve HDL and start baby aspirin  Obesity: he will try to stop eating at [place] for breakfast with and a 32 ounce sweet tea, he eats healthy for lunch and also eats out at night - but usually salads . He will try to eat breakfast at home and figure a way to cook at home  GERD and dysphagia: he has a history of esophageal stricture and dilation times two. [place] the beach [place], and since that time he needs to eat slowly and smaller bites also taking a PPI daily to control symptoms. He denies heartburn or indigestion.   IBS: controlled a this time, occasionally has abdominal cramping, no current of diarrhea or constipation  Fatigue: on the go all the time, he has two daughters a 31 yo, 57 yo and both parents work, but would like to have labs done   Patient Active Problem List   Diagnosis Date Noted  . History of esophageal stricture 10/07/2015  . IBS (irritable bowel syndrome) 10/07/2015  . Dyslipidemia 10/07/2015  . Insomnia 10/07/2015  . Diabetes mellitus screening 10/07/2015  . Radiculopathy 06/26/2013  . History of back surgery 06/21/2013  . HTN (hypertension) 06/21/2013  . GERD (gastroesophageal reflux disease) 06/02/2008  . Perennial allergic rhinitis with seasonal variation 05/08/2008    Past Surgical History:  Procedure Laterality Date  . LAPAROSCOPIC CHOLECYSTECTOMY  ~ 1999  . LUMBAR LAMINECTOMY/DECOMPRESSION MICRODISCECTOMY N/A 06/26/2013   Procedure: LUMBAR LAMINECTOMY/DECOMPRESSION MICRODISCECTOMY;   Surgeon: Sinclair Ship, MD;  Location: [facility];  Service: Orthopedics;  Laterality: N/A;  . TONSILLECTOMY  1975    History reviewed. No pertinent family history.  Social History   Social History  . Marital status: Married    Spouse name: N/A  . Number of children: N/A  . Years of education: N/A   Occupational History  . Not on file.   Social History Main Topics  . Smoking status: Never Smoker  . Smokeless tobacco: Former Systems developer     Comment: "quit chewing in the 1980's"  . Alcohol use 1.2 oz/week    2 Shots of liquor per week  . Drug use: No  . Sexual activity: Yes   Other Topics Concern  . Not on file   Social History Narrative  . No narrative on file     Current Outpatient Prescriptions:  .  amitriptyline (ELAVIL) 50 MG tablet, Take 1 tablet (50 mg total) by mouth at bedtime., Disp: 90 tablet, Rfl: 3 .  lisinopril (PRINIVIL,ZESTRIL) 10 MG tablet, Take 1 tablet (10 mg total) by mouth daily., Disp: 90 tablet, Rfl: 3 .  omeprazole (PRILOSEC) 20 MG capsule, Take 1 capsule (20 mg total) by mouth daily., Disp: 30 capsule, Rfl: 3  No Known Allergies   ROS  Constitutional: Negative for fever or weight change.  Respiratory: Negative for cough and shortness of breath.   Cardiovascular: Negative for chest pain or palpitations.  Gastrointestinal: Negative for abdominal pain,  no bowel changes.  Musculoskeletal: Negative for gait problem or joint swelling.  Skin: Negative for rash.  Neurological: Negative for dizziness or headache.  No other specific complaints in a complete review of systems (except as listed in HPI above).  Objective  Vitals:   10/07/15 1421  BP: 124/84  Pulse: (!) 103  Resp: 18  Temp: 98.2 F (36.8 C)  SpO2: 93%  Weight: 256 lb 6 oz (116.3 kg)  Height: 5\' 11"  (1.803 m)    Body mass index is 35.76 kg/m.  Physical Exam  Constitutional: Patient appears well-developed and well-nourished. Obese No distress.  HEENT: head atraumatic,  normocephalic, pupils equal and reactive to light,neck supple, throat within normal limits Cardiovascular: Normal rate, regular rhythm and normal heart sounds.  No murmur heard. No BLE edema. Pulmonary/Chest: Effort normal and breath sounds normal. No respiratory distress. Abdominal: Soft.  There is no tenderness. Psychiatric: Patient has a normal mood and affect. behavior is normal. Judgment and thought content normal.  PHQ2/9: Depression screen PHQ 2/9 10/07/2015  Decreased Interest 0  Down, Depressed, Hopeless 0  PHQ - 2 Score 0     Fall Risk: Fall Risk  10/07/2015  Falls in the past year? No     Functional Status Survey: Is the patient deaf or have difficulty hearing?: No Does the patient have difficulty seeing, even when wearing glasses/contacts?: No Does the patient have difficulty concentrating, remembering, or making decisions?: No Does the patient have difficulty walking or climbing stairs?: No Does the patient have difficulty dressing or bathing?: No Does the patient have difficulty doing errands alone such as visiting a doctor's office or shopping?: No   Assessment & Plan  1. Dysphagia  - CBC with Differential/Platelet - Ambulatory referral to Gastroenterology  2. Gastroesophageal reflux disease without esophagitis  - omeprazole (PRILOSEC) 20 MG capsule; Take 1 capsule (20 mg total) by mouth daily.  Dispense: 30 capsule; Refill: 3 - Ambulatory referral to Gastroenterology  3. History of esophageal stricture  - Ambulatory referral to Gastroenterology  4. IBS (irritable bowel syndrome)  Doing well at this time  5. Need for Tdap vaccination  - Tdap vaccine greater than or equal to 7yo IM  6. Need for hepatitis C screening test  - Hepatitis C antibody  7. Other fatigue  - CBC with Differential/Platelet - TSH - Vitamin B12 - VITAMIN D 25 Hydroxy (Vit-D Deficiency, Fractures)  8. Prostate cancer screening  - PSA  9. Essential hypertension  -  lisinopril (PRINIVIL,ZESTRIL) 10 MG tablet; Take 1 tablet (10 mg total) by mouth daily.  Dispense: 90 tablet; Refill: 3 - COMPLETE METABOLIC PANEL WITH GFR  10. Diabetes mellitus screening  - Hemoglobin A1c  11. Dyslipidemia  - Lipid panel  12. Insomnia  - amitriptyline (ELAVIL) 50 MG tablet; Take 1 tablet (50 mg total) by mouth at bedtime.  Dispense: 90 tablet; Refill: 3

## 2015-10-08 ENCOUNTER — Encounter: Payer: Self-pay | Admitting: Gastroenterology

## 2015-10-20 ENCOUNTER — Ambulatory Visit (INDEPENDENT_AMBULATORY_CARE_PROVIDER_SITE_OTHER): Payer: 59 | Admitting: Gastroenterology

## 2015-10-20 ENCOUNTER — Encounter: Payer: Self-pay | Admitting: Gastroenterology

## 2015-10-20 VITALS — BP 130/84 | HR 104 | Ht 71.5 in | Wt 258.0 lb

## 2015-10-20 DIAGNOSIS — K219 Gastro-esophageal reflux disease without esophagitis: Secondary | ICD-10-CM | POA: Diagnosis not present

## 2015-10-20 DIAGNOSIS — Z1212 Encounter for screening for malignant neoplasm of rectum: Secondary | ICD-10-CM | POA: Diagnosis not present

## 2015-10-20 DIAGNOSIS — R1314 Dysphagia, pharyngoesophageal phase: Secondary | ICD-10-CM | POA: Diagnosis not present

## 2015-10-20 DIAGNOSIS — Z1211 Encounter for screening for malignant neoplasm of colon: Secondary | ICD-10-CM | POA: Diagnosis not present

## 2015-10-20 MED ORDER — NA SULFATE-K SULFATE-MG SULF 17.5-3.13-1.6 GM/177ML PO SOLN: 1.0000 | Freq: Once | ORAL | 0 refills | Status: AC

## 2015-10-20 NOTE — Patient Instructions (Signed)
Continue your omeprazole.  Patient advised to avoid spicy, acidic, citrus, chocolate, mints, fruit and fruit juices.  Limit the intake of caffeine, alcohol and Soda.  Don't exercise too soon after eating.  Don't lie down within 3-4 hours of eating.  Elevate the head of your bed.  You have been scheduled for an endoscopy and colonoscopy. Please follow the written instructions given to you at your visit today. Please pick up your prep supplies at the pharmacy within the next 1-3 days. If you use inhalers (even only as needed), please bring them with you on the day of your procedure. Your physician has requested that you go to www.startemmi.com and enter the access code given to you at your visit today. This web site gives a general overview about your procedure. However, you should still follow specific instructions given to you by our office regarding your preparation for the procedure.  Normal BMI (Body Mass Index- based on height and weight) is between 19 and 25. Your BMI today is Body mass index is 35.48 kg/m. Marland Kitchen Please consider follow up  regarding your BMI with your Primary Care Provider.  Thank you for choosing me and [facility].  Pricilla Riffle. Dagoberto Ligas., MD., [facility]

## 2015-10-20 NOTE — Progress Notes (Signed)
    History of Present Illness: This is a 60 year old male referred by Steele Sizer, MD for the evaluation of dysphagia, GERD and mild intermittent LLQ pain. He relates a history of GERD with esophageal stricture and dilation performed in the past however we do not have records available today. He states he discontinued taking daily omeprazole after reading reports about potential long-term side effects of PPIs. His reflux symptoms have gradually worsened over the past 2 years and he has developed intermittent solid food dysphagia with several episodes of transient food impactions over the past 2 years. After evaluation by his PCP 2 weeks ago he resumed taking omeprazole on a daily basis and his reflux symptoms have improved however his dysphagia persists. He has had transient mild, intermittient left lower quadrant pain for years that has not changes and states he's been diagnosed with irritable bowel syndrome in the past. He has not previously had colonoscopy. Denies weight loss, constipation, diarrhea, change in stool caliber, melena, hematochezia, nausea, vomiting, chest pain.  Review of Systems: Pertinent positive and negative review of systems were noted in the above HPI section. All other review of systems were otherwise negative.  Current Medications, Allergies, Past Medical History, Past Surgical History, Family History and Social History were reviewed in [facility] record.  Physical Exam: General: Well developed, well nourished, no acute distress Head: Normocephalic and atraumatic Eyes:  sclerae anicteric, EOMI Ears: Normal auditory acuity Mouth: No deformity or lesions Neck: Supple, no masses or thyromegaly Lungs: Clear throughout to auscultation Heart: Regular rate and rhythm; no murmurs, rubs or bruits Abdomen: Soft, non tender and non distended. No masses, hepatosplenomegaly or hernias noted. Normal Bowel sounds Rectal: deferred to  colonoscopy Musculoskeletal: Symmetrical with no gross deformities  Skin: No lesions on visible extremities Pulses:  Normal pulses noted Extremities: No clubbing, cyanosis, edema or deformities noted Neurological: Alert oriented x 4, grossly nonfocal Cervical Nodes:  No significant cervical adenopathy Inguinal Nodes: No significant inguinal adenopathy Psychological:  Alert and cooperative. Normal mood and affect  Assessment and Recommendations:  1. Dysphagia and GERD. R/O stricture, esophagitis. Follow antireflux measures long term and continue omeprazole 20 mg qam for now. The risks (including bleeding, perforation, infection, missed lesions, medication reactions and possible hospitalization or surgery if complications occur), benefits, and alternatives to endoscopy with possible biopsy and possible dilation were discussed with the patient and they consent to proceed.   2. CRC screening, average risk. Mild, intermittent, chronic LLQ pain. The risks (including bleeding, perforation, infection, missed lesions, medication reactions and possible hospitalization or surgery if complications occur), benefits, and alternatives to colonoscopy with possible biopsy and possible polypectomy were discussed with the patient and they consent to proceed.   cc: Steele Sizer, MD [address] [place], [place] [address]

## 2015-10-22 ENCOUNTER — Other Ambulatory Visit: Payer: Self-pay | Admitting: Family Medicine

## 2015-10-22 LAB — CBC WITH DIFFERENTIAL/PLATELET
Basophils Absolute: 66 cells/uL (ref 0–200)
Basophils Relative: 1 %
Eosinophils Absolute: 264 cells/uL (ref 15–500)
Eosinophils Relative: 4 %
HCT: 50.1 % — ABNORMAL HIGH (ref 38.5–50.0)
HEMOGLOBIN: 17.3 g/dL — AB (ref 13.2–17.1)
LYMPHS PCT: 40 %
Lymphs Abs: 2640 cells/uL (ref 850–3900)
MCH: 30.5 pg (ref 27.0–33.0)
MCHC: 34.5 g/dL (ref 32.0–36.0)
MCV: 88.4 fL (ref 80.0–100.0)
MONO ABS: 594 {cells}/uL (ref 200–950)
MPV: 10 fL (ref 7.5–12.5)
Monocytes Relative: 9 %
NEUTROS PCT: 46 %
Neutro Abs: 3036 cells/uL (ref 1500–7800)
Platelets: 274 10*3/uL (ref 140–400)
RBC: 5.67 MIL/uL (ref 4.20–5.80)
RDW: 13.5 % (ref 11.0–15.0)
WBC: 6.6 10*3/uL (ref 3.8–10.8)

## 2015-10-22 LAB — COMPLETE METABOLIC PANEL WITH GFR
ALBUMIN: 4.6 g/dL (ref 3.6–5.1)
ALK PHOS: 51 U/L (ref 40–115)
ALT: 30 U/L (ref 9–46)
AST: 22 U/L (ref 10–35)
BUN: 13 mg/dL (ref 7–25)
CALCIUM: 9.6 mg/dL (ref 8.6–10.3)
CO2: 23 mmol/L (ref 20–31)
CREATININE: 1.16 mg/dL (ref 0.70–1.33)
Chloride: 105 mmol/L (ref 98–110)
GFR, Est African American: 80 mL/min (ref >=60)
GFR, Est Non African American: 70 mL/min (ref >=60)
Glucose, Bld: 90 mg/dL (ref 65–99)
Potassium: 4.6 mmol/L (ref 3.5–5.3)
SODIUM: 137 mmol/L (ref 135–146)
Total Bilirubin: 0.7 mg/dL (ref 0.2–1.2)
Total Protein: 7.5 g/dL (ref 6.1–8.1)

## 2015-10-22 LAB — PSA: PSA: 0.9 ng/mL (ref <=4.0)

## 2015-10-22 LAB — LIPID PANEL
Cholesterol: 145 mg/dL (ref 125–200)
HDL: 31 mg/dL — ABNORMAL LOW (ref >=40)
LDL CALC: 82 mg/dL (ref <130)
Total CHOL/HDL Ratio: 4.7 Ratio (ref <=5.0)
Triglycerides: 161 mg/dL — ABNORMAL HIGH (ref <150)
VLDL: 32 mg/dL — AB (ref <30)

## 2015-10-22 LAB — VITAMIN B12: Vitamin B-12: 557 pg/mL (ref 200–1100)

## 2015-10-22 LAB — TSH: TSH: 3.22 m[IU]/L (ref 0.40–4.50)

## 2015-10-23 LAB — HEMOGLOBIN A1C
HEMOGLOBIN A1C: 5.2 % (ref <5.7)
Mean Plasma Glucose: 103 mg/dL

## 2015-10-23 LAB — VITAMIN D 25 HYDROXY (VIT D DEFICIENCY, FRACTURES): Vit D, 25-Hydroxy: 39 ng/mL (ref 30–100)

## 2015-10-23 LAB — HEPATITIS C ANTIBODY: HCV Ab: NEGATIVE

## 2015-10-25 ENCOUNTER — Encounter: Payer: Self-pay | Admitting: Family Medicine

## 2015-10-25 DIAGNOSIS — E781 Pure hyperglyceridemia: Secondary | ICD-10-CM | POA: Insufficient documentation

## 2015-10-25 DIAGNOSIS — D582 Other hemoglobinopathies: Secondary | ICD-10-CM | POA: Insufficient documentation

## 2015-11-05 ENCOUNTER — Encounter: Payer: Self-pay | Admitting: Gastroenterology

## 2015-11-17 ENCOUNTER — Encounter: Payer: Self-pay | Admitting: Gastroenterology

## 2015-11-17 ENCOUNTER — Ambulatory Visit (AMBULATORY_SURGERY_CENTER): Payer: 59 | Admitting: Gastroenterology

## 2015-11-17 VITALS — BP 104/65 | HR 83 | Temp 96.4°F | Resp 16 | Ht 71.0 in | Wt 258.0 lb

## 2015-11-17 DIAGNOSIS — Z1212 Encounter for screening for malignant neoplasm of rectum: Secondary | ICD-10-CM

## 2015-11-17 DIAGNOSIS — R1314 Dysphagia, pharyngoesophageal phase: Secondary | ICD-10-CM

## 2015-11-17 DIAGNOSIS — D125 Benign neoplasm of sigmoid colon: Secondary | ICD-10-CM

## 2015-11-17 DIAGNOSIS — K222 Esophageal obstruction: Secondary | ICD-10-CM

## 2015-11-17 DIAGNOSIS — K635 Polyp of colon: Secondary | ICD-10-CM | POA: Diagnosis not present

## 2015-11-17 DIAGNOSIS — K2950 Unspecified chronic gastritis without bleeding: Secondary | ICD-10-CM | POA: Diagnosis not present

## 2015-11-17 DIAGNOSIS — Z1211 Encounter for screening for malignant neoplasm of colon: Secondary | ICD-10-CM | POA: Diagnosis present

## 2015-11-17 DIAGNOSIS — K229 Disease of esophagus, unspecified: Secondary | ICD-10-CM

## 2015-11-17 MED ORDER — SODIUM CHLORIDE 0.9 % IV SOLN: 500.0000 mL | INTRAVENOUS | Status: DC

## 2015-11-17 NOTE — Op Note (Signed)
[facility] Patient Name: Anthony Miller Procedure Date: 11/17/2015 2:53 PM MRN: [id number] Endoscopist: Ladene Artist , MD Age: 60 Referring MD:  Date of Birth: [birth year] Gender: Male Account #: [id number] Procedure:                Upper GI endoscopy Indications:              Dysphagia Medicines:                Monitored Anesthesia Care Procedure:                Pre-Anesthesia Assessment:                           - Prior to the procedure, a History and Physical                            was performed, and patient medications and                            allergies were reviewed. The patient's tolerance of                            previous anesthesia was also reviewed. The risks                            and benefits of the procedure and the sedation                            options and risks were discussed with the patient.                            All questions were answered, and informed consent                            was obtained. Prior Anticoagulants: The patient has                            taken no previous anticoagulant or antiplatelet                            agents. ASA Grade Assessment: II - A patient with                            mild systemic disease. After reviewing the risks                            and benefits, the patient was deemed in                            satisfactory condition to undergo the procedure.                           - Prior to the procedure, a History and Physical  was performed, and patient medications and                            allergies were reviewed. The patient's tolerance of                            previous anesthesia was also reviewed. The risks                            and benefits of the procedure and the sedation                            options and risks were discussed with the patient.                            All questions were answered, and informed consent                             was obtained. Prior Anticoagulants: The patient has                            taken no previous anticoagulant or antiplatelet                            agents. ASA Grade Assessment: II - A patient with                            mild systemic disease. After reviewing the risks                            and benefits, the patient was deemed in                            satisfactory condition to undergo the procedure.                           After obtaining informed consent, the endoscope was                            passed under direct vision. Throughout the                            procedure, the patient's blood pressure, pulse, and                            oxygen saturations were monitored continuously. The                            Model GIF-HQ190 [id number]) scope was introduced                            through the mouth, and advanced to the second part  of duodenum. The upper GI endoscopy was                            accomplished without difficulty. The patient                            tolerated the procedure well. Scope In: Scope Out: Findings:                 One mild benign-appearing, intrinsic stenosis was                            found at the gastroesophageal junction. This                            measured 1.1 cm (inner diameter) and was traversed.                            A guidewire was placed and the scope was withdrawn.                            Dilation was performed with a Savary dilators with                            mild resistance at 13, 14 and 15 mm. No heme noted                            on dilators.                           LA Grade A (one or more mucosal breaks less than 5                            mm, not extending between tops of 2 mucosal folds)                            esophagitis with no bleeding was found at the                            gastroesophageal junction.                            A single 4 mm mucosal nodule was found in the upper                            third of the esophagus. Biopsies were taken with a                            cold forceps for histology. The nodule was                            completely removed.  The exam of the esophagus was otherwise normal.                           Diffuse mild inflammation characterized by erythema                            and granularity was found in the gastric body and                            in the gastric antrum. Biopsies were taken with a                            cold forceps for histology.                           A small hiatal hernia was present.                           The exam of the stomach was otherwise normal.                           The duodenal bulb and second portion of the                            duodenum were normal. Complications:            No immediate complications. Estimated blood loss:                            Minimal. Estimated Blood Loss:     Estimated blood loss was minimal. Impression:               - Benign-appearing esophageal stenosis. Dilated.                           - LA Grade A reflux esophagitis.                           - Mucosal nodule found in the esophagus. Biopsied.                           - Gastritis. Biopsied.                           - Small hiatal hernia.                           - Normal duodenal bulb and second portion of the                            duodenum. Recommendation:           - Patient has a contact number available for                            emergencies. The signs and symptoms of potential  delayed complications were discussed with the                            patient. Return to normal activities tomorrow.                            Written discharge instructions were provided to the                            patient.                           - Clear liquid  diet for 2 hours, then advance as                            tolerated to soft diet today. Resume prior diet                            tomorrow.                           - Antireflux measure long term                           - Continue present medications including omeprazole                            20 mg daily long term.                           - Await pathology results. Ladene Artist, MD 11/17/2015 3:44:17 PM This report has been signed electronically.

## 2015-11-17 NOTE — Progress Notes (Signed)
Called to room to assist during endoscopic procedure.  Patient ID and intended procedure confirmed with present staff. Received instructions for my participation in the procedure from the performing physician.  

## 2015-11-17 NOTE — Patient Instructions (Signed)
YOU HAD AN ENDOSCOPIC PROCEDURE TODAY AT [facility]:   Refer to the procedure report that was given to you for any specific questions about what was found during the examination.  If the procedure report does not answer your questions, please call your gastroenterologist to clarify.  If you requested that your care partner not be given the details of your procedure findings, then the procedure report has been included in a sealed envelope for you to review at your convenience later.  YOU SHOULD EXPECT: Some feelings of bloating in the abdomen. Passage of more gas than usual.  Walking can help get rid of the air that was put into your GI tract during the procedure and reduce the bloating. If you had a lower endoscopy (such as a colonoscopy or flexible sigmoidoscopy) you may notice spotting of blood in your stool or on the toilet paper. If you underwent a bowel prep for your procedure, you may not have a normal bowel movement for a few days.  Please Note:  You might notice some irritation and congestion in your nose or some drainage.  This is from the oxygen used during your procedure.  There is no need for concern and it should clear up in a day or so.  SYMPTOMS TO REPORT IMMEDIATELY:   Following lower endoscopy (colonoscopy or flexible sigmoidoscopy):  Excessive amounts of blood in the stool  Significant tenderness or worsening of abdominal pains  Swelling of the abdomen that is new, acute  Fever of 100F or higher   Following upper endoscopy (EGD)  Vomiting of blood or coffee ground material  New chest pain or pain under the shoulder blades  Painful or persistently difficult swallowing  New shortness of breath  Fever of 100F or higher  Black, tarry-looking stools  For urgent or emergent issues, a gastroenterologist can be reached at any hour by calling [phone].   DIET:  Nothing to eat or drink until 4:30 p.m. Today.   Clear liquids only from 4;30-6;30 p.m.  Than  soft diet for the rest of the day today.   A regular diet may be resumed tomorrow.  ACTIVITY:  You should plan to take it easy for the rest of today and you should NOT DRIVE or use heavy machinery until tomorrow (because of the sedation medicines used during the test).    FOLLOW UP: Our staff will call the number listed on your records the next business day following your procedure to check on you and address any questions or concerns that you may have regarding the information given to you following your procedure. If we do not reach you, we will leave a message.  However, if you are feeling well and you are not experiencing any problems, there is no need to return our call.  We will assume that you have returned to your regular daily activities without incident.  If any biopsies were taken you will be contacted by phone or by letter within the next 1-3 weeks.  Please call us at [phone] if you have not heard about the biopsies in 3 weeks.    SIGNATURES/CONFIDENTIALITY: You and/or your care partner have signed paperwork which will be entered into your electronic medical record.  These signatures attest to the fact that that the information above on your After Visit Summary has been reviewed and is understood.  Full responsibility of the confidentiality of this discharge information lies with you and/or your care-partner.   Impressions/Recommendations:  Handout given  on polyps, esophagitis, GERD,and gastritis.  Repeat colonoscopy in 5 years pending polyp evaluation if precancerous.  Otherwise 10 years for screening.

## 2015-11-17 NOTE — Progress Notes (Signed)
A and O x3. Report to RN. Tolerated MAC anesthesia well.Teeth unchanged after procedure. 

## 2015-11-17 NOTE — Op Note (Signed)
[place] Patient Name: Anthony Miller Procedure Date: 11/17/2015 2:49 PM MRN: [id number] Endoscopist: Ladene Artist , MD Age: 60 Referring MD:  Date of Birth: 08-20-1955 Gender: Male Account #: [id number] Procedure:                Colonoscopy Indications:              Screening for colorectal malignant neoplasm Medicines:                Monitored Anesthesia Care Procedure:                Pre-Anesthesia Assessment:                           - Prior to the procedure, a History and Physical                            was performed, and patient medications and                            allergies were reviewed. The patient's tolerance of                            previous anesthesia was also reviewed. The risks                            and benefits of the procedure and the sedation                            options and risks were discussed with the patient.                            All questions were answered, and informed consent                            was obtained. Prior Anticoagulants: The patient has                            taken no previous anticoagulant or antiplatelet                            agents. ASA Grade Assessment: II - A patient with                            mild systemic disease. After reviewing the risks                            and benefits, the patient was deemed in                            satisfactory condition to undergo the procedure.                           After obtaining informed consent, the colonoscope  was passed under direct vision. Throughout the                            procedure, the patient's blood pressure, pulse, and                            oxygen saturations were monitored continuously. The                            Model PCF-H190L [id number]) scope was introduced                            through the anus and advanced to the the cecum,                            identified by  appendiceal orifice and ileocecal                            valve. The ileocecal valve, appendiceal orifice,                            and rectum were photographed. The quality of the                            bowel preparation was good. The colonoscopy was                            performed without difficulty. The patient tolerated                            the procedure well. Scope In: 3:04:32 PM Scope Out: 3:15:47 PM Scope Withdrawal Time: 0 hours 9 minutes 50 seconds  Total Procedure Duration: 0 hours 11 minutes 15 seconds  Findings:                 The perianal and digital rectal examinations were                            normal.                           A 6 mm polyp was found in the sigmoid colon. The                            polyp was sessile. The polyp was removed with a                            cold snare. Resection and retrieval were complete.                           The exam was otherwise without abnormality on                            direct and retroflexion views. Complications:  No immediate complications. Estimated blood loss:                            None. Estimated Blood Loss:     Estimated blood loss: none. Impression:               - One 6 mm polyp in the sigmoid colon, removed with                            a cold snare. Resected and retrieved.                           - The examination was otherwise normal on direct                            and retroflexion views. Recommendation:           - Repeat colonoscopy in 5 years for surveillance if                            polyp is precancerous, otherwise 10 years for                            screening.                           - Patient has a contact number available for                            emergencies. The signs and symptoms of potential                            delayed complications were discussed with the                            patient. Return to normal activities  tomorrow.                            Written discharge instructions were provided to the                            patient.                           - Resume previous diet.                           - Continue present medications.                           - Await pathology results. Ladene Artist, MD 11/17/2015 3:32:48 PM This report has been signed electronically.

## 2015-11-18 ENCOUNTER — Telehealth: Payer: Self-pay | Admitting: *Deleted

## 2015-11-18 NOTE — Telephone Encounter (Signed)
  Follow up Call-  Call back number 11/17/2015  Post procedure Call Back phone  # [phone]  Permission to leave phone message Yes  Some recent data might be hidden     Patient questions:  Do you have a fever, pain , or abdominal swelling? No. Pain Score  0 *  Have you tolerated food without any problems? Yes.    Have you been able to return to your normal activities? Yes.    Do you have any questions about your discharge instructions: Diet   No. Medications  No. Follow up visit  No.  Do you have questions or concerns about your Care? No.  Actions: * If pain score is 4 or above: No action needed, pain <4.

## 2015-11-30 ENCOUNTER — Encounter: Payer: Self-pay | Admitting: Gastroenterology

## 2015-12-16 ENCOUNTER — Encounter: Payer: 59 | Admitting: Family Medicine

## 2015-12-21 ENCOUNTER — Encounter: Payer: Self-pay | Admitting: Gastroenterology

## 2015-12-21 ENCOUNTER — Ambulatory Visit (INDEPENDENT_AMBULATORY_CARE_PROVIDER_SITE_OTHER): Payer: 59 | Admitting: Gastroenterology

## 2015-12-21 VITALS — BP 160/88 | HR 72 | Ht 71.5 in | Wt 262.2 lb

## 2015-12-21 DIAGNOSIS — K222 Esophageal obstruction: Secondary | ICD-10-CM | POA: Diagnosis not present

## 2015-12-21 DIAGNOSIS — K2100 Gastro-esophageal reflux disease with esophagitis, without bleeding: Secondary | ICD-10-CM

## 2015-12-21 NOTE — Progress Notes (Signed)
    History of Present Illness: This is a 60 year old male returning for follow-up of GERD with esophagitis and an esophageal stricture. EGD reported as below. His dysphagia completely resolved after dilation last month. He has no gastrointestinal complaints.  EGD 11/17/2015 - Benign-appearing esophageal stenosis. Dilated. - LA Grade A reflux esophagitis. - Mucosal nodule found in the esophagus. Biopsied. - Gastritis. Biopsied. - Small hiatal hernia. - Normal duodenal bulb and second portion of the duodenum.  Colonoscopy 11/17/2015 - One 6 mm polyp in the sigmoid colon, removed with a cold snare. Resected and retrieved. - The examination was otherwise normal on direct and retroflexion views.   Current Medications, Allergies, Past Medical History, Past Surgical History, Family History and Social History were reviewed in [employer] record.  Physical Exam: General: Well developed, well nourished, no acute distress Head: Normocephalic and atraumatic Eyes:  sclerae anicteric, EOMI Ears: Normal auditory acuity Mouth: No deformity or lesions Lungs: Clear throughout to auscultation Heart: Regular rate and rhythm; no murmurs, rubs or bruits Abdomen: Soft, non tender and non distended. No masses, hepatosplenomegaly or hernias noted. Normal Bowel sounds Musculoskeletal: Symmetrical with no gross deformities  Pulses:  Normal pulses noted Extremities: No clubbing, cyanosis, edema or deformities noted Neurological: Alert oriented x 4, grossly nonfocal Psychological:  Alert and cooperative. Normal mood and affect  Assessment and Recommendations:  1. GERD with LA Grade A esophagitis and an esophageal stricture. Continue omeprazole 20 mg daily and standard antireflux measures. Advised to return for follow-up if dysphagia recurs. Refill omeprazole at annual visit his PCP may refill.  2. CRC screening, average risk. Hyperplastic polyp at recent colonoscopy. Screening  colonoscopy in 10 years.  I spent 15 minutes of face-to-face time with the patient. Greater than 50% of the time was spent counseling and coordinating care.

## 2015-12-21 NOTE — Patient Instructions (Signed)
Thank you for choosing me and [facility].  Malcolm T. Stark, Jr., MD., FACG  

## 2015-12-26 ENCOUNTER — Ambulatory Visit (INDEPENDENT_AMBULATORY_CARE_PROVIDER_SITE_OTHER): Payer: 59 | Admitting: Family Medicine

## 2015-12-26 VITALS — BP 134/82 | HR 125 | Temp 98.2°F | Resp 20 | Ht 71.5 in | Wt 259.0 lb

## 2015-12-26 DIAGNOSIS — J209 Acute bronchitis, unspecified: Secondary | ICD-10-CM | POA: Diagnosis not present

## 2015-12-26 DIAGNOSIS — R000 Tachycardia, unspecified: Secondary | ICD-10-CM

## 2015-12-26 DIAGNOSIS — R55 Syncope and collapse: Secondary | ICD-10-CM

## 2015-12-26 MED ORDER — PREDNISONE 20 MG PO TABS: 40.0000 mg | ORAL_TABLET | Freq: Every day | ORAL | 0 refills | Status: DC

## 2015-12-26 MED ORDER — AZITHROMYCIN 250 MG PO TABS: ORAL_TABLET | ORAL | 0 refills | Status: DC

## 2015-12-26 MED ORDER — LEVALBUTEROL HCL 0.63 MG/3ML IN NEBU
0.6300 mg | INHALATION_SOLUTION | Freq: Once | RESPIRATORY_TRACT | Status: AC
  Administered 2015-12-26: 0.63 mg via RESPIRATORY_TRACT

## 2015-12-26 MED ORDER — HYDROCOD POLST-CPM POLST ER 10-8 MG/5ML PO SUER: 5.0000 mL | Freq: Two times a day (BID) | ORAL | 0 refills | Status: DC | PRN

## 2015-12-26 MED ORDER — ALBUTEROL SULFATE 108 (90 BASE) MCG/ACT IN AEPB: 2.0000 | INHALATION_SPRAY | RESPIRATORY_TRACT | 2 refills | Status: DC | PRN

## 2015-12-26 NOTE — Patient Instructions (Addendum)
   IF you received an x-ray today, you will receive an invoice from [facility]. Please contact [facility] at [phone] with questions or concerns regarding your invoice.   IF you received labwork today, you will receive an invoice from [facility]. Please contact [facility] at [phone] with questions or concerns regarding your invoice.   Our billing staff will not be able to assist you with questions regarding bills from these companies.  You will be contacted with the lab results as soon as they are available. The fastest way to get your results is to activate your My Chart account. Instructions are located on the last page of this paperwork. If you have not heard from us regarding the results in 2 weeks, please contact this office.    Acute Bronchitis Bronchitis is inflammation of the airways that extend from the windpipe into the lungs (bronchi). The inflammation often causes mucus to develop. This leads to a cough, which is the most common symptom of bronchitis.  In acute bronchitis, the condition usually develops suddenly and goes away over time, usually in a couple weeks. Smoking, allergies, and asthma can make bronchitis worse. Repeated episodes of bronchitis may cause further lung problems.  CAUSES Acute bronchitis is most often caused by the same virus that causes a cold. The virus can spread from person to person (contagious) through coughing, sneezing, and touching contaminated objects. SIGNS AND SYMPTOMS   Cough.   Fever.   Coughing up mucus.   Body aches.   Chest congestion.   Chills.   Shortness of breath.   Sore throat.  DIAGNOSIS  Acute bronchitis is usually diagnosed through a physical exam. Your health care provider will also ask you questions about your medical history. Tests, such as chest X-rays, are sometimes done to rule out other conditions.  TREATMENT  Acute bronchitis usually goes away in a  couple weeks. Oftentimes, no medical treatment is necessary. Medicines are sometimes given for relief of fever or cough. Antibiotic medicines are usually not needed but may be prescribed in certain situations. In some cases, an inhaler may be recommended to help reduce shortness of breath and control the cough. A cool mist vaporizer may also be used to help thin bronchial secretions and make it easier to clear the chest.  HOME CARE INSTRUCTIONS  Get plenty of rest.   Drink enough fluids to keep your urine clear or pale yellow (unless you have a medical condition that requires fluid restriction). Increasing fluids may help thin your respiratory secretions (sputum) and reduce chest congestion, and it will prevent dehydration.   Take medicines only as directed by your health care provider.  If you were prescribed an antibiotic medicine, finish it all even if you start to feel better.  Avoid smoking and secondhand smoke. Exposure to cigarette smoke or irritating chemicals will make bronchitis worse. If you are a smoker, consider using nicotine gum or skin patches to help control withdrawal symptoms. Quitting smoking will help your lungs heal faster.   Reduce the chances of another bout of acute bronchitis by washing your hands frequently, avoiding people with cold symptoms, and trying not to touch your hands to your mouth, nose, or eyes.   Keep all follow-up visits as directed by your health care provider.  SEEK MEDICAL CARE IF: Your symptoms do not improve after 1 week of treatment.  SEEK IMMEDIATE MEDICAL CARE IF:  You develop an increased fever or chills.   You have chest pain.     You have severe shortness of breath.  You have bloody sputum.   You develop dehydration.  You faint or repeatedly feel like you are going to pass out.  You develop repeated vomiting.  You develop a severe headache. MAKE SURE YOU:   Understand these instructions.  Will watch your  condition.  Will get help right away if you are not doing well or get worse.   This information is not intended to replace advice given to you by your health care provider. Make sure you discuss any questions you have with your health care provider.   Document Released: 03/10/2004 Document Revised: 02/21/2014 Document Reviewed: 07/24/2012 Elsevier Interactive Patient Education 2016 Elsevier Inc.  

## 2015-12-26 NOTE — Progress Notes (Addendum)
Subjective:  By signing my name below, I, Raven Small, attest that this documentation has been prepared under the direction and in the presence of Delman Cheadle, MD.  Electronically Signed: Thea Alken, ED Scribe. 12/26/2015. 1:45 PM.   Patient ID: Anthony Miller, male    DOB: 12-Sep-1955, 60 y.o.   MRN: [id number]  HPI Chief Complaint  Patient presents with  . Sore Throat    x 5 days   . Nasal Congestion  . Cough  . Wheezing  . Loss of Consciousness    Pt states he passed out today during a coughing spell - he says he hit his head during the fall  . Immunizations    Pt wants flu shot - not sure if that is okay to get today     HPI Comments: Anthony Miller is a 60 y.o. male who presents to the [facility] complaining of productive cough consisting of yellow sputum. He reports associated wheeze, sore throat and nasal congestion and SOB. He also reports a syncope episode while having a coughing fit. States he hit his head on the wall and as a result sustained a knott on head. He has tried benadryl mixed with maalox as well as robitussin DM.  Pt denies fever, chills,  vision changes, nausea, fatigue, dizziness, light headiness. He has used an inhaler in the past but denies hx of asthma.  Patient Active Problem List   Diagnosis Date Noted  . Elevated hemoglobin ([place]) 10/25/2015  . Hypertriglyceridemia 10/25/2015  . History of esophageal stricture 10/07/2015  . IBS (irritable bowel syndrome) 10/07/2015  . Dyslipidemia 10/07/2015  . Insomnia 10/07/2015  . Radiculopathy 06/26/2013  . History of back surgery 06/21/2013  . HTN (hypertension) 06/21/2013  . GERD (gastroesophageal reflux disease) 06/02/2008  . Perennial allergic rhinitis with seasonal variation 05/08/2008   Past Medical History:  Diagnosis Date  . Arthritis    "lower back/pelvic region" (06/21/2013)  . GERD (gastroesophageal reflux disease)   . Hiatal hernia   . Hypertension   . IBS (irritable  bowel syndrome)   . Pneumonia 1970's; ~ 2010  . Status post dilation of esophageal narrowing    Past Surgical History:  Procedure Laterality Date  . LAPAROSCOPIC CHOLECYSTECTOMY  ~ 1999  . LUMBAR LAMINECTOMY/DECOMPRESSION MICRODISCECTOMY N/A 06/26/2013   Procedure: LUMBAR LAMINECTOMY/DECOMPRESSION MICRODISCECTOMY;  Surgeon: Sinclair Ship, MD;  Location: [facility];  Service: Orthopedics;  Laterality: N/A;  . TONSILLECTOMY  1975   No Known Allergies Prior to Admission medications   Medication Sig Start Date End Date Taking? Authorizing Provider  amitriptyline (ELAVIL) 50 MG tablet Take 1 tablet (50 mg total) by mouth at bedtime. 10/07/15  Yes Steele Sizer, MD  aspirin EC 81 MG tablet Take 1 tablet (81 mg total) by mouth daily. 10/07/15  Yes Steele Sizer, MD  lisinopril (PRINIVIL,ZESTRIL) 10 MG tablet Take 1 tablet (10 mg total) by mouth daily. 10/07/15  Yes Steele Sizer, MD  omeprazole (PRILOSEC) 20 MG capsule Take 1 capsule (20 mg total) by mouth daily. 10/07/15  Yes Steele Sizer, MD   Social History   Social History  . Marital status: Married    Spouse name: Melissa  . Number of children: 3  . Years of education: N/A   Occupational History  . 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 Smoking status: Never Smoker  . Smokeless tobacco: Former Systems developer    Types: Loss adjuster, chartered  Quit date: 10/20/1983     Comment: "quit chewing in the 1980's"  . Alcohol use 1.2 oz/week    2 Shots of liquor per week  . Drug use: No  . Sexual activity: Yes   Other Topics Concern  . Not on file   Social History Narrative   Living and working in [place], two younger daughters still at home   Older son , from previous marriage , not much contact   Review of Systems  Constitutional: Negative for chills, fatigue and fever.  HENT: Positive for congestion, postnasal drip and sore throat.   Eyes: Negative for visual disturbance.  Respiratory:  Positive for cough, shortness of breath and wheezing.   Gastrointestinal: Negative for diarrhea and nausea.  Neurological: Positive for syncope. Negative for dizziness and light-headedness.       Objective:   Physical Exam  Constitutional: He is oriented to person, place, and time. He appears well-developed and well-nourished. No distress.  HENT:  Head: Normocephalic and atraumatic.  Left Ear: Tympanic membrane is retracted. A middle ear effusion is present.  Mouth/Throat: Posterior oropharyngeal erythema present.  Right ear with cerumen. Nares with erythema. Postnasal drip  Eyes: Conjunctivae and EOM are normal.  Neck: Neck supple. No thyromegaly present.  Cardiovascular: Normal heart sounds.  Tachycardia present.   Pulmonary/Chest: Effort normal. He has rhonchi ( bibasilar expiratory rhonchi).  Decreased air movement throughout. Frequent cough  Musculoskeletal: Normal range of motion.  Lymphadenopathy:    He has no cervical adenopathy.  Neurological: He is alert and oriented to person, place, and time.  Skin: Skin is warm and dry.  Golf ball sized hematoma left parietal area with some erythema only mildly elevated and mildly tender. No wound.   Psychiatric: He has a normal mood and affect. His behavior is normal.  Nursing note and vitals reviewed.   Vitals:   12/26/15 1335  BP: 134/82  Pulse: (!) 125  Resp: 20  Temp: 98.2 F (36.8 C)  TempSrc: Oral  SpO2: 100%  Weight: 259 lb (117.5 kg)  Height: 5' 11.5" (1.816 m)     Orthostatic VS for the past 24 hrs:  BP- Lying Pulse- Lying BP- Sitting Pulse- Sitting BP- Standing at 0 minutes Pulse- Standing at 0 minutes  12/26/15 1400 136/82 130 140/84 138 136/78 140    EKG- sinus tachycardia. No acute ischemic changes.   Assessment & Plan:   1. Syncope and collapse - Vasovagal syncope during cough. Neuro exam normal. Cardiac exam normal other than sinus tach attributed to illness. If recurs patient needs to be seen  immediately but suspect he will do great with rehydration and cough suppression.  2. Tachycardia   3. Acute bronchitis, unspecified organism     Orders Placed This Encounter  Procedures  . Orthostatic vital signs  . EKG 12-Lead    Meds ordered this encounter  Medications  . levalbuterol (XOPENEX) nebulizer solution 0.63 mg  . chlorpheniramine-HYDROcodone (TUSSIONEX PENNKINETIC ER) 10-8 MG/5ML SUER    Sig: Take 5 mLs by mouth every 12 (twelve) hours as needed.    Dispense:  120 mL    Refill:  0  . predniSONE (DELTASONE) 20 MG tablet    Sig: Take 2 tablets (40 mg total) by mouth daily with breakfast.    Dispense:  10 tablet    Refill:  0  . azithromycin (ZITHROMAX) 250 MG tablet    Sig: Take 2 tabs PO x 1 dose, then 1 tab PO QD x 4 days  Dispense:  6 tablet    Refill:  0  . Albuterol Sulfate (PROAIR RESPICLICK) 123XX123 (90 Base) MCG/ACT AEPB    Sig: Inhale 2 puffs into the lungs every 4 (four) hours as needed.    Dispense:  1 each    Refill:  2    I personally performed the services described in this documentation, which was scribed in my presence. The recorded information has been reviewed and considered, and addended by me as needed.   Delman Cheadle, M.D.  [facility] [address] [place], [place] [address] [phone] phone [phone] fax  12/30/15 11:18 AM

## 2016-02-01 ENCOUNTER — Ambulatory Visit (INDEPENDENT_AMBULATORY_CARE_PROVIDER_SITE_OTHER): Payer: 59 | Admitting: Family Medicine

## 2016-02-01 VITALS — BP 130/80 | HR 92 | Temp 97.8°F | Resp 17 | Ht 72.5 in | Wt 261.0 lb

## 2016-02-01 DIAGNOSIS — K1120 Sialoadenitis, unspecified: Secondary | ICD-10-CM | POA: Diagnosis not present

## 2016-02-01 DIAGNOSIS — H6123 Impacted cerumen, bilateral: Secondary | ICD-10-CM | POA: Diagnosis not present

## 2016-02-01 DIAGNOSIS — J329 Chronic sinusitis, unspecified: Secondary | ICD-10-CM | POA: Diagnosis not present

## 2016-02-01 MED ORDER — AMOXICILLIN-POT CLAVULANATE 875-125 MG PO TABS: 1.0000 | ORAL_TABLET | Freq: Two times a day (BID) | ORAL | 0 refills | Status: DC

## 2016-02-01 MED ORDER — CARBAMIDE PEROXIDE 6.5 % OT SOLN: 5.0000 [drp] | Freq: Two times a day (BID) | OTIC | 0 refills | Status: DC

## 2016-02-01 NOTE — Progress Notes (Signed)
Patient ID: Anthony Miller, male    DOB: 1956-01-12, 60 y.o.   MRN: [id number]  PCP: Ashok Norris, MD  Chief Complaint  Patient presents with  . Facial Swelling    Subjective:   HPI 60 year old male presents for facial swelling on the left side of face x today. He noticed this morning mild swelling of his left face midline of his jaw that increased over the last two hours. Reports persistent PND and cough, no sore throat but irritated. He has some discomfort in left ear with nasal/sinus congestion. No chest tightness. Occasional wheeze at night that clears with cough. He is not taking any new medications.  Social History   Social History  . Marital status: Married    Spouse name: Melissa  . Number of children: 3  . Years of education: N/A   Occupational History  . 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 Smoking status: Never Smoker  . Smokeless tobacco: Former Systems developer    Types: [place] date: 10/20/1983     Comment: "quit chewing in the 1980's"  . Alcohol use 1.2 oz/week    2 Shots of liquor per week  . Drug use: No  . Sexual activity: Yes   Other Topics Concern  . Not on file   Social History Narrative   Living and working in [place], two younger daughters still at home   Older son , from previous marriage , not much contact   Family History  Problem Relation Age of Onset  . Breast cancer Mother   . Pancreatic cancer Paternal Grandmother    Review of Systems See HPI   Patient Active Problem List   Diagnosis Date Noted  . Elevated hemoglobin ([place]) 10/25/2015  . Hypertriglyceridemia 10/25/2015  . History of esophageal stricture 10/07/2015  . IBS (irritable bowel syndrome) 10/07/2015  . Dyslipidemia 10/07/2015  . Insomnia 10/07/2015  . Radiculopathy 06/26/2013  . History of back surgery 06/21/2013  . HTN (hypertension) 06/21/2013  . GERD (gastroesophageal reflux disease) 06/02/2008  .  Perennial allergic rhinitis with seasonal variation 05/08/2008     Prior to Admission medications   Medication Sig Start Date End Date Taking? Authorizing Provider  Albuterol Sulfate (PROAIR RESPICLICK) 123XX123 (90 Base) MCG/ACT AEPB Inhale 2 puffs into the lungs every 4 (four) hours as needed. 12/26/15  Yes Shawnee Knapp, MD  amitriptyline (ELAVIL) 50 MG tablet Take 1 tablet (50 mg total) by mouth at bedtime. 10/07/15  Yes Steele Sizer, MD  aspirin EC 81 MG tablet Take 1 tablet (81 mg total) by mouth daily. 10/07/15  Yes Steele Sizer, MD  azithromycin (ZITHROMAX) 250 MG tablet Take 2 tabs PO x 1 dose, then 1 tab PO QD x 4 days 12/26/15  Yes Shawnee Knapp, MD  chlorpheniramine-HYDROcodone [facility] PENNKINETIC ER) 10-8 MG/5ML SUER Take 5 mLs by mouth every 12 (twelve) hours as needed. 12/26/15  Yes Shawnee Knapp, MD  lisinopril (PRINIVIL,ZESTRIL) 10 MG tablet Take 1 tablet (10 mg total) by mouth daily. 10/07/15  Yes Steele Sizer, MD  omeprazole (PRILOSEC) 20 MG capsule Take 1 capsule (20 mg total) by mouth daily. 10/07/15  Yes Steele Sizer, MD  predniSONE (DELTASONE) 20 MG tablet Take 2 tablets (40 mg total) by mouth daily with breakfast. 12/26/15  Yes Shawnee Knapp, MD     No Known Allergies     Objective:  Physical Exam  Constitutional: He is oriented to person, place, and time. He appears well-developed and well-nourished.  HENT:  Head: Normocephalic.    Nose: Mucosal edema present.  Mouth/Throat: Uvula is midline and oropharynx is clear and moist.  Eyes: Conjunctivae and EOM are normal. Pupils are equal, round, and reactive to light.  Neck: Normal range of motion. Neck supple.  Cardiovascular: Normal rate, regular rhythm, normal heart sounds and intact distal pulses.   Pulmonary/Chest: Effort normal and breath sounds normal.  Lymphadenopathy:    He has cervical adenopathy.  Neurological: He is alert and oriented to person, place, and time.  Skin: Skin is warm and dry.  Psychiatric: He  has a normal mood and affect. His behavior is normal. Judgment and thought content normal.    Vitals:   02/01/16 1042  BP: 130/80  Pulse: 92  Resp: 17  Temp: 97.8 F (36.6 C)     Assessment & Plan:  1. Parotitis 2. Sinusitis, unspecified chronicity, unspecified location Plan: Start Augmentin 1 tablet twice daily with food to avoid stomach upset x 10 days. Complete all medication. -Return for follow-up if swelling persists. 3. Bilateral impacted cerumen - Plan: Ear wax removal  Carroll Sage. Kenton Kingfisher, MSN, FNP-C [facility]

## 2016-02-01 NOTE — Patient Instructions (Addendum)
Augmentin 875-125 mg, 1 tablet, twice daily.   Start Cetrizine (Zyrtec) or Levocetirizine (Xyzal) once nightly for upper respiratory symptom management.  IF you received an x-ray today, you will receive an invoice from [facility]. Please contact [facility] at [phone] with questions or concerns regarding your invoice.   IF you received labwork today, you will receive an invoice from [place]. Please contact [facility] at [phone] with questions or concerns regarding your invoice.   Our billing staff will not be able to assist you with questions regarding bills from these companies.  You will be contacted with the lab results as soon as they are available. The fastest way to get your results is to activate your My Chart account. Instructions are located on the last page of this paperwork. If you have not heard from [place] regarding the results in 2 weeks, please contact this office.     Parotitis Introduction Parotitis means that you have irritation and swelling (inflammation) in one or both of your parotid glands. These glands make spit (saliva). They are found on each side of your face, below and in front of your earlobes. You may or may not have pain with this condition. Follow these instructions at home: Medicines  Take over-the-counter and prescription medicines only as told by your doctor.  If you were prescribed an antibiotic medicine, take it as told by your doctor. Do not stop taking the antibiotic even if you start to feel better. Managing pain and swelling  Apply warm cloths (compresses) to the swollen area as told by your doctor.  Gently rub your parotid glands as told by your doctor. General instructions  Drink enough fluid to keep your pee (urine) clear or pale yellow.  Suck on sour candy. This may help:  To make your mouth less dry.  To make more spit.  Keep your mouth clean and moist.  Gargle with a salt-water mixture 3-4 times per day, or  as needed.  To make a salt-water mixture, stir -1 tsp of salt into 1 cup of warm water.  Take good care of your mouth:  Brush your teeth at least two times per day.  Floss your teeth every day.  See your dentist regularly.  Do not use tobacco products. These include cigarettes, chewing tobacco, or e-cigarettes. If you need help quitting,  ask your doctor.  Keep all follow-up visits as told by your doctor. This is important. Contact a doctor if:  You have a fever or chills.  You have new symptoms.  Your symptoms get worse.  Your symptoms do not get better with treatment. This information is not intended to replace advice given to you by your health care provider. Make sure you discuss any questions you have with your health care provider. Document Released: 03/05/2010 Document Revised: 07/09/2015 Document Reviewed: 06/26/2014  2017 Elsevier

## 2016-02-04 ENCOUNTER — Ambulatory Visit (INDEPENDENT_AMBULATORY_CARE_PROVIDER_SITE_OTHER): Payer: 59

## 2016-02-04 ENCOUNTER — Ambulatory Visit (INDEPENDENT_AMBULATORY_CARE_PROVIDER_SITE_OTHER): Payer: 59 | Admitting: Family Medicine

## 2016-02-04 VITALS — BP 150/98 | HR 99 | Temp 98.1°F | Resp 16 | Ht 72.5 in | Wt 257.2 lb

## 2016-02-04 DIAGNOSIS — R0602 Shortness of breath: Secondary | ICD-10-CM | POA: Diagnosis not present

## 2016-02-04 DIAGNOSIS — J9801 Acute bronchospasm: Secondary | ICD-10-CM

## 2016-02-04 DIAGNOSIS — R059 Cough, unspecified: Secondary | ICD-10-CM

## 2016-02-04 DIAGNOSIS — R05 Cough: Secondary | ICD-10-CM | POA: Diagnosis not present

## 2016-02-04 MED ORDER — PREDNISONE 10 MG PO TABS: 40.0000 mg | ORAL_TABLET | Freq: Every day | ORAL | 0 refills | Status: AC

## 2016-02-04 MED ORDER — IPRATROPIUM BROMIDE 0.02 % IN SOLN
0.5000 mg | Freq: Once | RESPIRATORY_TRACT | Status: AC
  Administered 2016-02-04: 0.5 mg via RESPIRATORY_TRACT

## 2016-02-04 MED ORDER — ALBUTEROL SULFATE (2.5 MG/3ML) 0.083% IN NEBU
2.5000 mg | INHALATION_SOLUTION | Freq: Once | RESPIRATORY_TRACT | Status: AC
  Administered 2016-02-04: 2.5 mg via RESPIRATORY_TRACT

## 2016-02-04 MED ORDER — BUDESONIDE-FORMOTEROL FUMARATE 160-4.5 MCG/ACT IN AERO: 2.0000 | INHALATION_SPRAY | Freq: Two times a day (BID) | RESPIRATORY_TRACT | 3 refills | Status: DC

## 2016-02-04 NOTE — Progress Notes (Signed)
Patient ID: PARMVIR URE, male    DOB: [birth year], 60 y.o.   MRN: [id number]  PCP: Ashok Norris, MD  Chief Complaint  Patient presents with  . URI    still no better, thick yellow plegm     Subjective:  HPI 60 year old male presents for evaluation of URI. He was previously seen and treated for parotitis on 02/01/16 with Augmentin times 10 days. For over last two days, he has woke up wheezing, persistently coughing, and shortness of breath. Used Albuterol continuously since last night without relief of shortness of breath and wheeze. Can't lay down,  "feels like Im barking" the more he talks left side chest is sore. He has one or two days of Augmentin remaining.  Social History   Social History  . Marital status: Married    Spouse name: Melissa  . Number of children: 3  . Years of education: N/A   Occupational History  . 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 Smoking status: Never Smoker  . Smokeless tobacco: Former Systems developer    Types: [place] date: 10/20/1983     Comment: "quit chewing in the 1980's"  . Alcohol use 1.2 oz/week    2 Shots of liquor per week  . Drug use: No  . Sexual activity: Yes   Other Topics Concern  . Not on file   Social History Narrative   Living and working in [place], two younger daughters still at home   Older son , from previous marriage , not much contact    Family History  Problem Relation Age of Onset  . Breast cancer Mother   . Pancreatic cancer Paternal Grandmother    Review of Systems  HPI  Patient Active Problem List   Diagnosis Date Noted  . Elevated hemoglobin ([place]) 10/25/2015  . Hypertriglyceridemia 10/25/2015  . History of esophageal stricture 10/07/2015  . IBS (irritable bowel syndrome) 10/07/2015  . Dyslipidemia 10/07/2015  . Insomnia 10/07/2015  . Radiculopathy 06/26/2013  . History of back surgery 06/21/2013  . HTN (hypertension) 06/21/2013  .  GERD (gastroesophageal reflux disease) 06/02/2008  . Perennial allergic rhinitis with seasonal variation 05/08/2008    No Known Allergies  Prior to Admission medications   Medication Sig Start Date End Date Taking? Authorizing Provider  Albuterol Sulfate (PROAIR RESPICLICK) 123XX123 (90 Base) MCG/ACT AEPB Inhale 2 puffs into the lungs every 4 (four) hours as needed. 12/26/15  Yes Shawnee Knapp, MD  amitriptyline (ELAVIL) 50 MG tablet Take 1 tablet (50 mg total) by mouth at bedtime. 10/07/15  Yes Steele Sizer, MD  amoxicillin-clavulanate (AUGMENTIN) 875-125 MG tablet Take 1 tablet by mouth 2 (two) times daily. 02/01/16  Yes Sedalia Muta, FNP  aspirin EC 81 MG tablet Take 1 tablet (81 mg total) by mouth daily. 10/07/15  Yes Steele Sizer, MD  carbamide peroxide (DEBROX) 6.5 % otic solution Place 5 drops into both ears 2 (two) times daily. 02/01/16  Yes Sedalia Muta, FNP  lisinopril (PRINIVIL,ZESTRIL) 10 MG tablet Take 1 tablet (10 mg total) by mouth daily. 10/07/15  Yes Steele Sizer, MD  omeprazole (PRILOSEC) 20 MG capsule Take 1 capsule (20 mg total) by mouth daily. 10/07/15  Yes Steele Sizer, MD  azithromycin (ZITHROMAX) 250 MG tablet Take 2 tabs PO x 1 dose, then 1 tab PO QD x 4 days Patient not taking: Reported on 02/04/2016 12/26/15   Harmon Pier  Ethlyn Daniels, MD  chlorpheniramine-HYDROcodone (TUSSIONEX PENNKINETIC ER) 10-8 MG/5ML SUER Take 5 mLs by mouth every 12 (twelve) hours as needed. Patient not taking: Reported on 02/04/2016 12/26/15   Shawnee Knapp, MD  predniSONE (DELTASONE) 20 MG tablet Take 2 tablets (40 mg total) by mouth daily with breakfast. Patient not taking: Reported on 02/04/2016 12/26/15   Shawnee Knapp, MD    Past Medical, Surgical Family and Social History reviewed and updated.    Objective:   Today's Vitals   02/04/16 1303  BP: (!) 150/98  Pulse: 99  Resp: 16  Temp: 98.1 F (36.7 C)  TempSrc: Oral  SpO2: 99%  Weight: 257 lb 3.2 oz (116.7 kg)  Height: 6'  0.5" (1.842 m)    Wt Readings from Last 3 Encounters:  02/04/16 257 lb 3.2 oz (116.7 kg)  02/01/16 261 lb (118.4 kg)  12/26/15 259 lb (117.5 kg)    Physical Exam  Constitutional: He is oriented to person, place, and time. He appears well-developed and well-nourished.  HENT:  Head: Normocephalic and atraumatic.  Right Ear: External ear normal.  Left Ear: External ear normal.  Mouth/Throat: Oropharynx is clear and moist.  Eyes: Conjunctivae are normal. Pupils are equal, round, and reactive to light.  Neck: Normal range of motion. Neck supple.  Cardiovascular: Normal rate, regular rhythm, normal heart sounds and intact distal pulses.   Pulmonary/Chest: Effort normal. He has wheezes in the right upper field and the left upper field.  Neurological: He is alert and oriented to person, place, and time.  Skin: Skin is warm and dry.  Psychiatric: He has a normal mood and affect. His behavior is normal. Judgment and thought content normal.         Assessment & Plan:  1. Shortness of breath - albuterol (PROVENTIL) (2.5 MG/3ML) 0.083% nebulizer solution 2.5 mg; Take 3 mLs (2.5 mg total) by nebulization once. - ipratropium (ATROVENT) nebulizer solution 0.5 mg; Take 2.5 mLs (0.5 mg total) by nebulization once. - ipratropium (ATROVENT) nebulizer solution 0.5 mg; Take 2.5 mLs (0.5 mg total) by nebulization once. - albuterol (PROVENTIL) (2.5 MG/3ML) 0.083% nebulizer solution 2.5 mg; Take 3 mLs (2.5 mg total) by nebulization once. - DG Chest 2 View  2. Bronchospasm -Prednisone 40 mg x 5 days. -Symbicort 2 puffs, twice daily after completing prednisone.  3. Cough -Continue Tussionex 5 ml every 12 hours as needed for cough.  Carroll Sage. Kenton Kingfisher, MSN, FNP-C [facility]

## 2016-02-04 NOTE — Patient Instructions (Addendum)
Take Prednisone 40 mg once daily x 5 days.  After you complete the prednisone, start Symbicort 2 puffs, twice daily.   IF you received an x-ray today, you will receive an invoice from [facility]. Please contact [facility] at [phone] with questions or concerns regarding your invoice.   IF you received labwork today, you will receive an invoice from [place]. Please contact [facility] at [phone] with questions or concerns regarding your invoice.   Our billing staff will not be able to assist you with questions regarding bills from these companies.  You will be contacted with the lab results as soon as they are available. The fastest way to get your results is to activate your My Chart account. Instructions are located on the last page of this paperwork. If you have not heard from [place] regarding the results in 2 weeks, please contact this office.     Bronchospasm, Adult A bronchospasm is when the tubes that carry air in and out of your lungs (airways) spasm or tighten. During a bronchospasm it is hard to breathe. This is because the airways get smaller. A bronchospasm can be triggered by:  Allergies. These may be to animals, pollen, food, or mold.  Infection. This is a common cause of bronchospasm.  Exercise.  Irritants. These include pollution, cigarette smoke, strong odors, aerosol sprays, and paint fumes.  Weather changes.  Stress.  Being emotional. Follow these instructions at home:  Always have a plan for getting help. Know when to call your doctor and local emergency services (911 in the [place] Know where you can get emergency care.  Only take medicines as told by your doctor.  If you were prescribed an inhaler or nebulizer machine, ask your doctor how to use it correctly. Always use a spacer with your inhaler if you were given one.  Stay calm during an attack. Try to relax and breathe more slowly.  Control your home environment:  Change your  heating and air conditioning filter at least once a month.  Limit your use of fireplaces and wood stoves.  Do not  smoke. Do not  allow smoking in your home.  Avoid perfumes and fragrances.  Get rid of pests (such as roaches and mice) and their droppings.  Throw away plants if you see mold on them.  Keep your house clean and dust free.  Replace carpet with wood, tile, or vinyl flooring. Carpet can trap dander and dust.  Use allergy-proof pillows, mattress covers, and box spring covers.  Wash bed sheets and blankets every week in hot water. Dry them in a dryer.  Use blankets that are made of polyester or cotton.  Wash hands frequently. Contact a doctor if:  You have muscle aches.  You have chest pain.  The thick spit you spit or cough up (sputum) changes from clear or white to yellow, green, gray, or bloody.  The thick spit you spit or cough up gets thicker.  There are problems that may be related to the medicine you are given such as:  A rash.  Itching.  Swelling.  Trouble breathing. Get help right away if:  You feel you cannot breathe or catch your breath.  You cannot stop coughing.  Your treatment is not helping you breathe better.  You have very bad chest pain. This information is not intended to replace advice given to you by your health care provider. Make sure you discuss any questions you have with your health care provider. Document Released: 11/28/2008 Document  Revised: 07/09/2015 Document Reviewed: 07/24/2012 Elsevier Interactive Patient Education  2017 [employer].

## 2016-02-17 ENCOUNTER — Ambulatory Visit (INDEPENDENT_AMBULATORY_CARE_PROVIDER_SITE_OTHER): Payer: BLUE CROSS/BLUE SHIELD | Admitting: Family Medicine

## 2016-02-17 ENCOUNTER — Encounter: Payer: Self-pay | Admitting: Family Medicine

## 2016-02-17 VITALS — BP 139/95 | HR 100 | Temp 97.7°F | Resp 16 | Ht 72.0 in | Wt 259.0 lb

## 2016-02-17 DIAGNOSIS — Z23 Encounter for immunization: Secondary | ICD-10-CM | POA: Diagnosis not present

## 2016-02-17 DIAGNOSIS — R059 Cough, unspecified: Secondary | ICD-10-CM

## 2016-02-17 DIAGNOSIS — R05 Cough: Secondary | ICD-10-CM

## 2016-02-17 MED ORDER — HYDROCOD POLST-CPM POLST ER 10-8 MG/5ML PO SUER: 5.0000 mL | Freq: Two times a day (BID) | ORAL | 0 refills | Status: DC | PRN

## 2016-02-17 MED ORDER — PSEUDOEPH-BROMPHEN-DM 30-2-10 MG/5ML PO SYRP
5.0000 mL | ORAL_SOLUTION | Freq: Four times a day (QID) | ORAL | 0 refills | Status: DC | PRN
Start: 2016-02-17 — End: 2016-02-17

## 2016-02-17 MED ORDER — PSEUDOEPH-BROMPHEN-DM 30-2-10 MG/5ML PO SYRP
5.0000 mL | ORAL_SOLUTION | Freq: Four times a day (QID) | ORAL | 2 refills | Status: DC | PRN
Start: 2016-02-17 — End: 2016-02-24

## 2016-02-17 NOTE — Patient Instructions (Addendum)
Brompheniramine-pseudoephedrine-DM 30-2-10 MG/5ML every 5 ml times 4 times daily.  Tussionex 5 ml every 12 hours for cough.  Return for care as needed. Glad you are improving.   IF you received an x-ray today, you will receive an invoice from [facility]. Please contact [facility]iology at [phone] with questions or concerns regarding your invoice.   IF you received labwork today, you will receive an invoice from [place]. Please contact [facility] at [phone] with questions or concerns regarding your invoice.   Our billing staff will not be able to assist you with questions regarding bills from these companies.  You will be contacted with the lab results as soon as they are available. The fastest way to get your results is to activate your My Chart account. Instructions are located on the last page of this paperwork. If you have not heard from [place] regarding the results in 2 weeks, please contact this office.      Cough, Adult Introduction A cough helps to clear your throat and lungs. A cough may last only 2-3 weeks (acute), or it may last longer than 8 weeks (chronic). Many different things can cause a cough. A cough may be a sign of an illness or another medical condition. Follow these instructions at home:  Pay attention to any changes in your cough.  Take medicines only as told by your doctor.  If you were prescribed an antibiotic medicine, take it as told by your doctor. Do not stop taking it even if you start to feel better.  Talk with your doctor before you try using a cough medicine.  Drink enough fluid to keep your pee (urine) clear or pale yellow.  If the air is dry, use a cold steam vaporizer or humidifier in your home.  Stay away from things that make you cough at work or at home.  If your cough is worse at night, try using extra pillows to raise your head up higher while you sleep.  Do not smoke, and try not to be around smoke. If you need help  quitting, ask your doctor.  Do not have caffeine.  Do not drink alcohol.  Rest as needed. Contact a doctor if:  You have new problems (symptoms).  You cough up yellow fluid (pus).  Your cough does not get better after 2-3 weeks, or your cough gets worse.  Medicine does not help your cough and you are not sleeping well.  You have pain that gets worse or pain that is not helped with medicine.  You have a fever.  You are losing weight and you do not know why.  You have night sweats. Get help right away if:  You cough up blood.  You have trouble breathing.  Your heartbeat is very fast. This information is not intended to replace advice given to you by your health care provider. Make sure you discuss any questions you have with your health care provider. Document Released: 10/14/2010 Document Revised: 07/09/2015 Document Reviewed: 04/09/2014  2017 Elsevier

## 2016-02-17 NOTE — Progress Notes (Signed)
Patient ID: [name], male    DOB: [birth year], 61 y.o.   MRN: [id number]  PCP: Anthony Norris, MD  Chief Complaint  Patient presents with  . Follow-up    cough and chest congestion/ pt states his congestion comes and goes.  . Immunizations    flu shot    Subjective:  HPI  Anthony Miller, 60 year old male presents for follow-up of shortness of breath. Pt was previously seen on 02/04/2016 for acute shortness of breath with wheezing. He had a prior episode of bronchitis which consisted of the same cluster of symptoms 12/26/2015. He reports today overall improvement of symptoms with the exception of intermittent cough and nasal congestion. Reports while on prednisone taper symptoms completely resolved. About 2 days after completion of prednisone, intermittent cough and nasal congestion began. He has managed cough with Tussionex, shortness of breath with Symbicort twice daily, and he has recently started taking cetirizine 10 mg daily. He reports that cough is exacerbated by going from outdoors to indoors due to massive changes in temperature.   Social History   Social History  . Marital status: Married    Spouse name: Anthony Miller  . Number of children: 3  . Years of education: N/A   Occupational History  . 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 Smoking status: Never Smoker  . Smokeless tobacco: Former Systems developer    Types: [place] date: 10/20/1983     Comment: "quit chewing in the 1980's"  . Alcohol use 1.2 oz/week    2 Shots of liquor per week  . Drug use: No  . Sexual activity: Yes   Other Topics Concern  . Not on file   Social History Narrative   Living and working in [place], two younger daughters still at home   Older son , from previous marriage , not much contact    Family History  Problem Relation Age of Onset  . Breast cancer Mother   . Pancreatic cancer Paternal Grandmother    Review of Systems  See  HPI  Patient Active Problem List   Diagnosis Date Noted  . Elevated hemoglobin ([place]) 10/25/2015  . Hypertriglyceridemia 10/25/2015  . History of esophageal stricture 10/07/2015  . IBS (irritable bowel syndrome) 10/07/2015  . Dyslipidemia 10/07/2015  . Insomnia 10/07/2015  . Radiculopathy 06/26/2013  . History of back surgery 06/21/2013  . HTN (hypertension) 06/21/2013  . GERD (gastroesophageal reflux disease) 06/02/2008  . Perennial allergic rhinitis with seasonal variation 05/08/2008    No Known Allergies  Prior to Admission medications   Medication Sig Start Date End Date Taking? Authorizing Provider  Albuterol Sulfate (PROAIR RESPICLICK) 123XX123 (90 Base) MCG/ACT AEPB Inhale 2 puffs into the lungs every 4 (four) hours as needed. 12/26/15  Yes Shawnee Knapp, MD  amitriptyline (ELAVIL) 50 MG tablet Take 1 tablet (50 mg total) by mouth at bedtime. 10/07/15  Yes Steele Sizer, MD  aspirin EC 81 MG tablet Take 1 tablet (81 mg total) by mouth daily. 10/07/15  Yes Steele Sizer, MD  budesonide-formoterol [facility]) 160-4.5 MCG/ACT inhaler Inhale 2 puffs into the lungs 2 (two) times daily. 02/04/16  Yes Sedalia Muta, FNP  chlorpheniramine-HYDROcodone [facility]) 10-8 MG/5ML SUER Take 5 mLs by mouth every 12 (twelve) hours as needed. 02/17/16  Yes Sedalia Muta, FNP  lisinopril (PRINIVIL,ZESTRIL) 10 MG tablet Take 1 tablet (10 mg total) by mouth daily. 10/07/15  Yes Steele Sizer, MD  omeprazole (PRILOSEC) 20 MG capsule Take 1 capsule (20 mg total) by mouth daily. 10/07/15  Yes Steele Sizer, MD  predniSONE (DELTASONE) 20 MG tablet Take 2 tablets (40 mg total) by mouth daily with breakfast. 12/26/15  Yes Shawnee Knapp, MD  amoxicillin-clavulanate (AUGMENTIN) 875-125 MG tablet Take 1 tablet by mouth 2 (two) times daily. Patient not taking: Reported on 02/17/2016 02/01/16   Sedalia Muta, FNP  azithromycin (ZITHROMAX) 250 MG tablet Take 2 tabs PO x 1  dose, then 1 tab PO QD x 4 days Patient not taking: Reported on 02/17/2016 12/26/15   Shawnee Knapp, MD  brompheniramine-pseudoephedrine-DM 30-2-10 MG/5ML syrup Take 5 mLs by mouth 4 (four) times daily as needed. 02/17/16   Sedalia Muta, FNP  carbamide peroxide (DEBROX) 6.5 % otic solution Place 5 drops into both ears 2 (two) times daily. 02/01/16   Sedalia Muta, FNP    Past Medical, Surgical Family and Social History reviewed and updated.    Objective:    Wt Readings from Last 3 Encounters:  02/04/16 257 lb 3.2 oz (116.7 kg)  02/01/16 261 lb (118.4 kg)  12/26/15 259 lb (117.5 kg)    Physical Exam  Constitutional: He is oriented to person, place, and time. He appears well-developed and well-nourished.  HENT:  Head: Normocephalic and atraumatic.  Right Ear: External ear normal.  Left Ear: External ear normal.  Eyes: Conjunctivae are normal. Pupils are equal, round, and reactive to light.  Neck: Normal range of motion. Neck supple.  Cardiovascular: Normal rate, regular rhythm, normal heart sounds and intact distal pulses.   Pulmonary/Chest: Effort normal and breath sounds normal.  Lymphadenopathy:    He has no cervical adenopathy.  Neurological: He is alert and oriented to person, place, and time.  Skin: Skin is warm and dry.  Psychiatric: He has a normal mood and affect. His behavior is normal. Judgment and thought content normal.       Assessment & Plan:  1. Cough 2. Flu vaccine need - Flu Vaccine QUAD 36+ mos IM  Cough is continually occurring intermittently. Pt takes Tussionex at night time only, therefore, I am going to prescribe Non-sedating cough syrup during the day and he can continue to use Tussionex at bedtime as needed. Encouraged pt to continue Symbicort twice daily and use rescue inhaler for intermittent episodes of shortness of breath.  Plan: . chlorpheniramine-HYDROcodone (TUSSIONEX PENNKINETIC ER) 10-8 MG/5ML SUER    Sig: Take 5 mLs by  mouth every 12 (twelve) hours as needed.    Dispense:  120 mL  . brompheniramine-pseudoephedrine-DM 30-2-10 MG/5ML syrup    Sig: Take 5 mLs by mouth 4 (four) times daily as needed.    Carroll Sage. Kenton Kingfisher, MSN, FNP-C [facility]

## 2016-02-24 ENCOUNTER — Ambulatory Visit (INDEPENDENT_AMBULATORY_CARE_PROVIDER_SITE_OTHER): Payer: BLUE CROSS/BLUE SHIELD | Admitting: Family Medicine

## 2016-02-24 ENCOUNTER — Encounter: Payer: Self-pay | Admitting: Family Medicine

## 2016-02-24 VITALS — BP 122/82 | HR 114 | Temp 98.0°F | Resp 18 | Ht 72.0 in | Wt 259.0 lb

## 2016-02-24 DIAGNOSIS — J302 Other seasonal allergic rhinitis: Secondary | ICD-10-CM

## 2016-02-24 DIAGNOSIS — R0683 Snoring: Secondary | ICD-10-CM

## 2016-02-24 DIAGNOSIS — D582 Other hemoglobinopathies: Secondary | ICD-10-CM

## 2016-02-24 DIAGNOSIS — R071 Chest pain on breathing: Secondary | ICD-10-CM

## 2016-02-24 DIAGNOSIS — K219 Gastro-esophageal reflux disease without esophagitis: Secondary | ICD-10-CM | POA: Diagnosis not present

## 2016-02-24 DIAGNOSIS — J3089 Other allergic rhinitis: Secondary | ICD-10-CM

## 2016-02-24 DIAGNOSIS — R222 Localized swelling, mass and lump, trunk: Secondary | ICD-10-CM

## 2016-02-24 DIAGNOSIS — R05 Cough: Secondary | ICD-10-CM

## 2016-02-24 DIAGNOSIS — R059 Cough, unspecified: Secondary | ICD-10-CM

## 2016-02-24 DIAGNOSIS — R0789 Other chest pain: Secondary | ICD-10-CM

## 2016-02-24 DIAGNOSIS — I10 Essential (primary) hypertension: Secondary | ICD-10-CM | POA: Diagnosis not present

## 2016-02-24 MED ORDER — ALBUTEROL SULFATE (2.5 MG/3ML) 0.083% IN NEBU
2.5000 mg | INHALATION_SOLUTION | Freq: Once | RESPIRATORY_TRACT | Status: AC
  Administered 2016-02-24: 2.5 mg via RESPIRATORY_TRACT

## 2016-02-24 MED ORDER — FLUTICASONE PROPIONATE 50 MCG/ACT NA SUSP: 2.0000 | Freq: Every day | NASAL | 2 refills | Status: DC

## 2016-02-24 MED ORDER — LOSARTAN POTASSIUM 50 MG PO TABS: 50.0000 mg | ORAL_TABLET | Freq: Every day | ORAL | 3 refills | Status: DC

## 2016-02-24 MED ORDER — MELOXICAM 15 MG PO TABS: 15.0000 mg | ORAL_TABLET | Freq: Every day | ORAL | 0 refills | Status: DC

## 2016-02-24 MED ORDER — CETIRIZINE HCL 10 MG PO TABS: 10.0000 mg | ORAL_TABLET | Freq: Every day | ORAL | 11 refills | Status: DC

## 2016-02-24 NOTE — Progress Notes (Signed)
Name: Anthony Miller   MRN: [id number]    DOB: Jul 09, 1955   Date:02/24/2016       Progress Note  Subjective  Chief Complaint  Chief Complaint  Patient presents with  . Bronchitis    Patient has been to [facility] on Dec.18, 2017-had Parotitis and given antibiotic and nebulizer treatment, February 17, 2016-Excessive white mucus-feels like it is due to weather change. Urgent Care feels like it is weather induced Asthma.   . Cough    Patient is having continues cough maybe from [place] to discuss.  . Mass    Patient has had a nodule on right chest for a while but it is becoming raised and would like it checked out.     HPI  Cough: wife told him he has been coughing most of 2017, however had an episode of URI symptoms followed post-nasal drainage and a productive cough, was given Z-pack and short prednisone course. Symptoms improved but returned so he took Augmenting, Symbicort and Ventolin prn. He states today is the best he has felt in a long time. Still has a cough, but very mild sputum, and is now clear. He is back on Omeprazole, and is also using Flonase and Zyrtec. He states his allergies are year round but worse with season change. He has a personal history of recurrent bronchitis. No family history of asthma. He has never been a smoker.  Mass: he states that he has a nodule on right lower anterior chest for years, but yesterday he got concerned because he noticed tenderness on the area above, worse with cough, and when he touches the area  HTN: on lisinopril for the past 2 years, bp has been at goal  Snoring: elevated hgb on last labs, discussed sleep study, he denies fatigue or sleeping during the day  Patient Active Problem List   Diagnosis Date Noted  . Elevated hemoglobin ([place]) 10/25/2015  . Hypertriglyceridemia 10/25/2015  . History of esophageal stricture 10/07/2015  . IBS (irritable bowel syndrome) 10/07/2015  . Dyslipidemia 10/07/2015  . Insomnia  10/07/2015  . Radiculopathy 06/26/2013  . History of back surgery 06/21/2013  . HTN (hypertension) 06/21/2013  . GERD (gastroesophageal reflux disease) 06/02/2008  . Perennial allergic rhinitis with seasonal variation 05/08/2008    Past Surgical History:  Procedure Laterality Date  . LAPAROSCOPIC CHOLECYSTECTOMY  ~ 1999  . LUMBAR LAMINECTOMY/DECOMPRESSION MICRODISCECTOMY N/A 06/26/2013   Procedure: LUMBAR LAMINECTOMY/DECOMPRESSION MICRODISCECTOMY;  Surgeon: Sinclair Ship, MD;  Location: [place];  Service: Orthopedics;  Laterality: N/A;  . TONSILLECTOMY  1975    Family History  Problem Relation Age of Onset  . Breast cancer Mother   . Pancreatic cancer Paternal Grandmother     Social History   Social History  . Marital status: Married    Spouse name: Melissa  . Number of children: 3  . Years of education: N/A   Occupational History  . 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 Smoking status: Never Smoker  . Smokeless tobacco: Former Systems developer    Types: [place] date: 10/20/1983     Comment: "quit chewing in the 1980's"  . Alcohol use 1.2 oz/week    2 Shots of liquor per week  . Drug use: No  . Sexual activity: Yes   Other Topics Concern  . Not on file   Social History Narrative   Living and working in [place], two younger  daughters still at home   Older son , from previous marriage , not much contact     Current Outpatient Prescriptions:  .  Albuterol Sulfate (PROAIR RESPICLICK) 123XX123 (90 Base) MCG/ACT AEPB, Inhale 2 puffs into the lungs every 4 (four) hours as needed., Disp: 1 each, Rfl: 2 .  amitriptyline (ELAVIL) 50 MG tablet, Take 1 tablet (50 mg total) by mouth at bedtime., Disp: 90 tablet, Rfl: 3 .  aspirin EC 81 MG tablet, Take 1 tablet (81 mg total) by mouth daily., Disp: 30 tablet, Rfl: 0 .  budesonide-formoterol (SYMBICORT) 160-4.5 MCG/ACT inhaler, Inhale 2 puffs into the lungs 2 (two) times  daily., Disp: 1 Inhaler, Rfl: 3 .  carbamide peroxide (DEBROX) 6.5 % otic solution, Place 5 drops into both ears 2 (two) times daily., Disp: 15 mL, Rfl: 0 .  chlorpheniramine-HYDROcodone (TUSSIONEX PENNKINETIC ER) 10-8 MG/5ML SUER, Take 5 mLs by mouth every 12 (twelve) hours as needed., Disp: 120 mL, Rfl: 0 .  omeprazole (PRILOSEC) 20 MG capsule, Take 1 capsule (20 mg total) by mouth daily., Disp: 30 capsule, Rfl: 3 .  losartan (COZAAR) 50 MG tablet, Take 1 tablet (50 mg total) by mouth daily., Disp: 90 tablet, Rfl: 3  No Known Allergies   ROS  Constitutional: Negative for fever or weight change.  Respiratory: Positive for cough but no  shortness of breath.   Cardiovascular: Negative for chest pain or palpitations.  Gastrointestinal: Negative for abdominal pain, no bowel changes.  Musculoskeletal: Negative for gait problem or joint swelling.  Skin: Negative for rash.  Neurological: Negative for dizziness or headache.  No other specific complaints in a complete review of systems (except as listed in HPI above).  Objective  Vitals:   02/24/16 1420  BP: 122/82  Pulse: (!) 114  Resp: 18  Temp: 98 F (36.7 C)  TempSrc: Oral  SpO2: 94%  Weight: 259 lb (117.5 kg)  Height: 6' (1.829 m)    Body mass index is 35.13 kg/m.  Physical Exam  Constitutional: Patient appears well-developed and well-nourished. Obese  No distress.  HEENT: head atraumatic, normocephalic, pupils equal and reactive to light, ears normal TM bilaterally, neck supple, throat within normal limits Cardiovascular: Normal rate, regular rhythm and normal heart sounds.  No murmur heard. No BLE edema. Pulmonary/Chest: Effort normal and breath sounds normal. No respiratory distress. He has a hard nodule on right lower rib cage, and tender during palpation of lower right costochondral joint Abdominal: Soft.  There is no tenderness. Psychiatric: Patient has a normal mood and affect. behavior is normal. Judgment and thought  content normal.  PHQ2/9: Depression screen [facility] 2/9 02/17/2016 02/04/2016 02/01/2016 10/07/2015  Decreased Interest 0 0 0 0  Down, Depressed, Hopeless 0 0 0 0  PHQ - 2 Score 0 0 0 0    Fall Risk: Fall Risk  02/04/2016 02/01/2016 10/07/2015  Falls in the past year? No No No      Functional Status Survey: Is the patient deaf or have difficulty hearing?: No Does the patient have difficulty seeing, even when wearing glasses/contacts?: No Does the patient have difficulty concentrating, remembering, or making decisions?: No Does the patient have difficulty walking or climbing stairs?: No Does the patient have difficulty dressing or bathing?: No    Assessment & Plan  1. Nodule of chest wall  - [place] Chest; Future  2. Cough  - spirometry Continue Symbicort , flonase and Zyrtec. Seems to be getting better but would not stop inhaler until cough resolved.  GERD is under control at this time Stop ACE and change to ARB  3. Costochondral pain  - [place] Chest; Future -Meloxicam 15 mg daily #30 no refills -discussed not to take with any other NSAID's  4. Gastroesophageal reflux disease without esophagitis   5. Essential hypertension  Changing from ACE to ARB because of cough - losartan (COZAAR) 50 MG tablet; Take 1 tablet (50 mg total) by mouth daily.  Dispense: 90 tablet; Refill: 3  6. Snoring  - Ambulatory referral to Sleep Studies  7. Elevated hemoglobin (H[facility])  - Ambulatory referral to Sleep Studies  8. Perennial allergic rhinitis with seasonal variation  - fluticasone (FLONASE) 50 MCG/ACT nasal spray; Place 2 sprays into both nostrils daily.  Dispense: 16 g; Refill: 2 - cetirizine (ZYRTEC) 10 MG tablet; Take 1 tablet (10 mg total) by mouth daily.  Dispense: 30 tablet; Refill: 11

## 2016-03-01 ENCOUNTER — Ambulatory Visit
Admission: RE | Admit: 2016-03-01 | Discharge: 2016-03-01 | Disposition: A | Discharge status: Home / Self Care | Payer: BLUE CROSS/BLUE SHIELD | Source: Ambulatory Visit | Attending: Family Medicine | Admitting: Family Medicine

## 2016-03-01 DIAGNOSIS — R0789 Other chest pain: Secondary | ICD-10-CM

## 2016-03-01 DIAGNOSIS — R222 Localized swelling, mass and lump, trunk: Secondary | ICD-10-CM | POA: Diagnosis not present

## 2016-03-01 DIAGNOSIS — R071 Chest pain on breathing: Secondary | ICD-10-CM

## 2016-03-01 DIAGNOSIS — R938 Abnormal findings on diagnostic imaging of other specified body structures: Secondary | ICD-10-CM | POA: Diagnosis not present

## 2016-03-08 ENCOUNTER — Telehealth: Payer: Self-pay

## 2016-03-08 NOTE — Telephone Encounter (Signed)
Patient called regarding his chest x-ray, please review and annotate. Thanks

## 2016-03-09 NOTE — Telephone Encounter (Signed)
Not sure why did not come to my inbox He has a lipoma on the mass on right lower chest, and swelling around costochondral joint, consistent with Tietze  Syndrome. Treatment is symptom control. Anti-inflammatory , usually goes down with time Please send information from up to date

## 2016-03-09 NOTE — Telephone Encounter (Signed)
Scheduled appointment for this Friday

## 2016-03-09 NOTE — Telephone Encounter (Signed)
Patient is still coughing and took the cough medication that hasn't helped any. Patient was wondering if he could be prescribed Prednisone and is willing to come back. Patient states his cough becomes so severe heavy clear mucus comes out. Patient states the weather changing exaggerates his symptom like he has Asthma. Even with inhalers does not clear up symptoms. Please advise.

## 2016-03-09 NOTE — Telephone Encounter (Signed)
Can he come in for a spirometry ? I can call in prednisone if no appointments any time soon. I did not see a CXR just [place], we may need to check a CXR also

## 2016-03-11 ENCOUNTER — Ambulatory Visit
Admission: RE | Admit: 2016-03-11 | Discharge: 2016-03-11 | Disposition: A | Discharge status: Home / Self Care | Payer: BLUE CROSS/BLUE SHIELD | Source: Ambulatory Visit | Attending: Family Medicine | Admitting: Family Medicine

## 2016-03-11 ENCOUNTER — Ambulatory Visit (INDEPENDENT_AMBULATORY_CARE_PROVIDER_SITE_OTHER): Payer: BLUE CROSS/BLUE SHIELD | Admitting: Family Medicine

## 2016-03-11 ENCOUNTER — Encounter: Payer: Self-pay | Admitting: Family Medicine

## 2016-03-11 VITALS — BP 118/68 | HR 97 | Temp 98.0°F | Resp 18 | Wt 259.7 lb

## 2016-03-11 DIAGNOSIS — R062 Wheezing: Secondary | ICD-10-CM | POA: Diagnosis not present

## 2016-03-11 DIAGNOSIS — R0989 Other specified symptoms and signs involving the circulatory and respiratory systems: Secondary | ICD-10-CM

## 2016-03-11 DIAGNOSIS — M940 Chondrocostal junction syndrome [Tietze]: Secondary | ICD-10-CM | POA: Diagnosis not present

## 2016-03-11 DIAGNOSIS — R05 Cough: Secondary | ICD-10-CM | POA: Insufficient documentation

## 2016-03-11 DIAGNOSIS — R059 Cough, unspecified: Secondary | ICD-10-CM

## 2016-03-11 DIAGNOSIS — D171 Benign lipomatous neoplasm of skin and subcutaneous tissue of trunk: Secondary | ICD-10-CM | POA: Diagnosis not present

## 2016-03-11 DIAGNOSIS — J3089 Other allergic rhinitis: Secondary | ICD-10-CM

## 2016-03-11 DIAGNOSIS — J302 Other seasonal allergic rhinitis: Secondary | ICD-10-CM

## 2016-03-11 LAB — CBC
HEMATOCRIT: 49.9 % (ref 38.5–50.0)
Hemoglobin: 16.9 g/dL (ref 13.2–17.1)
MCH: 30.7 pg (ref 27.0–33.0)
MCHC: 33.9 g/dL (ref 32.0–36.0)
MCV: 90.6 fL (ref 80.0–100.0)
MPV: 9.4 fL (ref 7.5–12.5)
Platelets: 295 10*3/uL (ref 140–400)
RBC: 5.51 MIL/uL (ref 4.20–5.80)
RDW: 13.5 % (ref 11.0–15.0)
WBC: 7.7 10*3/uL (ref 3.8–10.8)

## 2016-03-11 NOTE — Progress Notes (Signed)
Name: Anthony Miller   MRN: [id number]    DOB: 1956-01-07   Date:03/11/2016       Progress Note  Subjective  Chief Complaint  Chief Complaint  Patient presents with  . Cough    Still taking Symbicort and allergy pill, but still coughing up gobs of clear mucus. Patient states if he speaks for periods of time he will start a deep and long cough. Patient still having congestion and post nasal drainage in the back of his throat. States prednisone has helped him in the past.     HPI  Cough: he states he is still coughing but phlegm is now clear and feels like a post-nasal drainage, able to speak in full sentences, and no longer has SOB. Also sleeping well at night, but has to take the cough medication. He has a long history of allergies and is using nasal spray, zyrtec and Symbicort. He has to clear his throat on a regular basis and sneezes multiple times a day. Discussed checking CXR and refer to allergist and he agrees  Tietze Syndrome: checked on [place], he still has swelling on right costochondral area but is no longer as painful.   Lipoma: on [place], no change in size and was given reassurance  Patient Active Problem List   Diagnosis Date Noted  . 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 Lipoma of anterior chest wall 03/11/2016  . Elevated hemoglobin ([place]) 10/25/2015  . Hypertriglyceridemia 10/25/2015  . History of esophageal stricture 10/07/2015  . IBS (irritable bowel syndrome) 10/07/2015  . Dyslipidemia 10/07/2015  . Insomnia 10/07/2015  . Radiculopathy 06/26/2013  . History of back surgery 06/21/2013  . HTN (hypertension) 06/21/2013  . GERD (gastroesophageal reflux disease) 06/02/2008  . Perennial allergic rhinitis with seasonal variation 05/08/2008    Past Surgical History:  Procedure Laterality Date  . LAPAROSCOPIC CHOLECYSTECTOMY  ~ 1999  . LUMBAR LAMINECTOMY/DECOMPRESSION MICRODISCECTOMY N/A 06/26/2013   Procedure: LUMBAR LAMINECTOMY/DECOMPRESSION MICRODISCECTOMY;  Surgeon: Sinclair Ship, MD;  Location: [place];  Service: Orthopedics;  Laterality: N/A;  . TONSILLECTOMY  1975    Family History  Problem Relation Age of Onset  . Breast cancer Mother   . Pancreatic cancer Paternal Grandmother     Social History   Social History  . Marital status: Married    Spouse name: Melissa  . Number of children: 3  . Years of education: N/A   Occupational History  . 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 Smoking status: Never Smoker  . Smokeless tobacco: Former Systems developer    Types: [place] date: 10/20/1983     Comment: "quit chewing in the 1980's"  . Alcohol use 1.2 oz/week    2 Shots of liquor per week  . Drug use: No  . Sexual activity: Yes   Other Topics Concern  . Not on file   Social History Narrative   Living and working in [place], two younger daughters still at home   Older son , from previous marriage , not much contact     Current Outpatient Prescriptions:  .  Albuterol Sulfate (PROAIR RESPICLICK) 123XX123 (90 Base) MCG/ACT AEPB, Inhale 2 puffs into the lungs every 4 (four) hours as needed., Disp: 1 each, Rfl: 2 .  amitriptyline (ELAVIL) 50 MG tablet, Take 1 tablet (50 mg total) by mouth at bedtime., Disp: 90 tablet, Rfl: 3 .  aspirin EC 81 MG tablet, Take 1 tablet (  81 mg total) by mouth daily., Disp: 30 tablet, Rfl: 0 .  brompheniramine-pseudoephedrine-DM 30-2-10 MG/5ML syrup, TAKE 5 MLS BY MOUTH 4 (FOUR) TIMES DAILY AS NEEDED., Disp: , Rfl: 2 .  budesonide-formoterol (SYMBICORT) 160-4.5 MCG/ACT inhaler, Inhale 2 puffs into the lungs 2 (two) times daily., Disp: 1 Inhaler, Rfl: 3 .  carbamide peroxide (DEBROX) 6.5 % otic solution, Place 5 drops into both ears 2 (two) times daily., Disp: 15 mL, Rfl: 0 .  cetirizine (ZYRTEC) 10 MG tablet, Take 1 tablet (10 mg total) by mouth daily., Disp: 30 tablet, Rfl: 11 .  chlorpheniramine-HYDROcodone (TUSSIONEX PENNKINETIC ER) 10-8 MG/5ML SUER, Take 5 mLs by  mouth every 12 (twelve) hours as needed., Disp: 120 mL, Rfl: 0 .  fluticasone (FLONASE) 50 MCG/ACT nasal spray, Place 2 sprays into both nostrils daily., Disp: 16 g, Rfl: 2 .  losartan (COZAAR) 50 MG tablet, Take 1 tablet (50 mg total) by mouth daily., Disp: 90 tablet, Rfl: 3 .  meloxicam (MOBIC) 15 MG tablet, Take 1 tablet (15 mg total) by mouth daily., Disp: 30 tablet, Rfl: 0 .  omeprazole (PRILOSEC) 20 MG capsule, Take 1 capsule (20 mg total) by mouth daily., Disp: 30 capsule, Rfl: 3  No Known Allergies   ROS  Ten systems reviewed and is negative except as mentioned in HPI   Objective  Vitals:   03/11/16 0853  BP: 118/68  Pulse: 97  Resp: 18  Temp: 98 F (36.7 C)  TempSrc: Oral  SpO2: 97%  Weight: 259 lb 11.2 oz (117.8 kg)    Body mass index is 35.22 kg/m.  Physical Exam  Constitutional: Patient appears well-developed and well-nourished. Obese  No distress.  HEENT: head atraumatic, normocephalic, pupils equal and reactive to light, boggy turbinates neck supple, throat within normal limits Cardiovascular: Normal rate, regular rhythm and normal heart sounds.  No murmur heard. No BLE edema. Pulmonary/Chest: Effort normal and breath sounds normal. No respiratory distress. Abdominal: Soft.  There is no tenderness. Psychiatric: Patient has a normal mood and affect. behavior is normal. Judgment and thought content normal.  PHQ2/9: Depression screen [facility] 2/9 03/11/2016 02/17/2016 02/04/2016 02/01/2016 10/07/2015  Decreased Interest 0 0 0 0 0  Down, Depressed, Hopeless 0 0 0 0 0  PHQ - 2 Score 0 0 0 0 0     Fall Risk: Fall Risk  03/11/2016 02/04/2016 02/01/2016 10/07/2015  Falls in the past year? No No No No    Assessment & Plan  1. Cough  - DG Chest 2 View; Future - Ambulatory referral to Allergy - Brain natriuretic peptide - CBC   2. Perennial allergic rhinitis with seasonal variation  - Ambulatory referral to Allergy  3. Tietze syndrome  Reassurance   4.  Lipoma of anterior chest wall  Reassurance  5. Respiratory crackles of both lungs  - Brain natriuretic peptide - CBC  6. Wheezing  I was not expecting to hear crackles or wheezing during his exam, he states today is the best he has felt in a while, no SOB or chest pain, we will check labs and CXR and treat depending on results

## 2016-03-12 LAB — BRAIN NATRIURETIC PEPTIDE: BRAIN NATRIURETIC PEPTIDE: 12.2 pg/mL (ref <100)

## 2016-03-18 ENCOUNTER — Other Ambulatory Visit: Payer: Self-pay | Admitting: Family Medicine

## 2016-03-18 NOTE — Telephone Encounter (Signed)
Pt states that the referral to  allergy in [place] will not be until 21 of Feburary due to him being on vacation. Pt would like to know if you could possibly refill his tussionex. Asking that [facility] to [facility] [place] states he still have a cough. [phone]

## 2016-03-21 NOTE — Telephone Encounter (Signed)
Pt will have to wait for doctor to return for that medication

## 2016-03-22 NOTE — Telephone Encounter (Signed)
Did you forward this request to dr Ancil Boozer or would you like for me to?

## 2016-03-23 ENCOUNTER — Other Ambulatory Visit: Payer: Self-pay | Admitting: Family Medicine

## 2016-03-23 NOTE — Progress Notes (Unsigned)
Tussionex is a controlled medication for short term use. We can send benzonate capsules if he would like

## 2016-03-24 ENCOUNTER — Telehealth: Payer: Self-pay

## 2016-03-24 NOTE — Telephone Encounter (Signed)
Left pt a message stating what Dr.Sowles has said and to call back if he would like the benzonate called in

## 2016-04-04 ENCOUNTER — Ambulatory Visit: Payer: BLUE CROSS/BLUE SHIELD | Admitting: Family Medicine

## 2016-04-06 DIAGNOSIS — K219 Gastro-esophageal reflux disease without esophagitis: Secondary | ICD-10-CM | POA: Diagnosis not present

## 2016-04-06 DIAGNOSIS — J4540 Moderate persistent asthma, uncomplicated: Secondary | ICD-10-CM | POA: Diagnosis not present

## 2016-04-06 DIAGNOSIS — J309 Allergic rhinitis, unspecified: Secondary | ICD-10-CM | POA: Diagnosis not present

## 2016-04-22 ENCOUNTER — Ambulatory Visit (INDEPENDENT_AMBULATORY_CARE_PROVIDER_SITE_OTHER): Payer: BLUE CROSS/BLUE SHIELD | Admitting: Physician Assistant

## 2016-04-22 ENCOUNTER — Ambulatory Visit: Payer: BLUE CROSS/BLUE SHIELD | Admitting: Family Medicine

## 2016-04-22 VITALS — BP 130/85 | HR 99 | Resp 18 | Ht 72.0 in | Wt 258.0 lb

## 2016-04-22 DIAGNOSIS — R062 Wheezing: Secondary | ICD-10-CM

## 2016-04-22 DIAGNOSIS — R0982 Postnasal drip: Secondary | ICD-10-CM | POA: Diagnosis not present

## 2016-04-22 DIAGNOSIS — R05 Cough: Secondary | ICD-10-CM

## 2016-04-22 DIAGNOSIS — J302 Other seasonal allergic rhinitis: Secondary | ICD-10-CM | POA: Diagnosis not present

## 2016-04-22 DIAGNOSIS — R059 Cough, unspecified: Secondary | ICD-10-CM

## 2016-04-22 MED ORDER — PREDNISONE 20 MG PO TABS: ORAL_TABLET | ORAL | 0 refills | Status: DC

## 2016-04-22 MED ORDER — MONTELUKAST SODIUM 10 MG PO TABS: 10.0000 mg | ORAL_TABLET | Freq: Every day | ORAL | 3 refills | Status: DC

## 2016-04-22 MED ORDER — MUCINEX DM MAXIMUM STRENGTH 60-1200 MG PO TB12: 1.0000 | ORAL_TABLET | Freq: Two times a day (BID) | ORAL | 1 refills | Status: DC

## 2016-04-22 MED ORDER — HYDROCOD POLST-CPM POLST ER 10-8 MG/5ML PO SUER: 5.0000 mL | Freq: Two times a day (BID) | ORAL | 0 refills | Status: DC | PRN

## 2016-04-22 NOTE — Progress Notes (Signed)
Anthony Miller  MRN: [id number] DOB: 1955/02/27  PCP: Loistine Chance, MD  Subjective:  Pt is a 61 year old male who presents to clinic for cough x 5 days.  "constant irritation with post nasal drip" + sneezing.  Can hear a "rattling" when lays down at night. Lots of mucus. Nose is constantly stopped up. Sleeps sitting up in recliner.  Off Symbicort. On proair and dulara. Dymista   Denies fever, chills, SOB, chest tightness He did have flu shot this season.   H/o cough x several months. Presented to PCP 12/2015 treated with Tussionex, nebulizer, prednisone, Azithromycin, and proair. chest x-rays negative. Did not feel better for several months.  Was referred to radiology for lung ultrasound.  Allergy/asthma last month - not allergic to anything.  Cough seems to worsen with changes in weather. Has not been evaluated by pulmonology.   Review of Systems  Constitutional: Negative for chills, diaphoresis and fever.  HENT: Positive for postnasal drip, rhinorrhea and sneezing.   Respiratory: Positive for cough. Negative for chest tightness, shortness of breath and wheezing.   Cardiovascular: Negative for chest pain, palpitations and leg swelling.  Gastrointestinal: Negative for diarrhea, nausea and vomiting.  Musculoskeletal: Negative for neck pain.  Neurological: Negative for dizziness, syncope, light-headedness and headaches.  Psychiatric/Behavioral: Negative for sleep disturbance. The patient is not nervous/anxious.     Patient Active Problem List   Diagnosis Date Noted  . 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 Lipoma of anterior chest wall 03/11/2016  . Elevated hemoglobin ([place]) 10/25/2015  . Hypertriglyceridemia 10/25/2015  . History of esophageal stricture 10/07/2015  . IBS (irritable bowel syndrome) 10/07/2015  . Dyslipidemia 10/07/2015  . Insomnia 10/07/2015  . Radiculopathy 06/26/2013  . History of back surgery 06/21/2013  . HTN (hypertension) 06/21/2013  . GERD  (gastroesophageal reflux disease) 06/02/2008  . Perennial allergic rhinitis with seasonal variation 05/08/2008    Current Outpatient Prescriptions on File Prior to Visit  Medication Sig Dispense Refill  . Albuterol Sulfate (PROAIR RESPICLICK) 376 (90 Base) MCG/ACT AEPB Inhale 2 puffs into the lungs every 4 (four) hours as needed. 1 each 2  . losartan (COZAAR) 50 MG tablet Take 1 tablet (50 mg total) by mouth daily. (Patient not taking: Reported on 04/22/2016) 90 tablet 3  . omeprazole (PRILOSEC) 20 MG capsule Take 1 capsule (20 mg total) by mouth daily. (Patient not taking: Reported on 04/22/2016) 30 capsule 3   No current facility-administered medications on file prior to visit.     No Known Allergies   Objective:  BP 130/85   Pulse 99   Resp 18   Ht 6' (1.829 m)   Wt 258 lb (117 kg)   SpO2 96%   BMI 34.99 kg/m   Physical Exam  Constitutional: He is oriented to person, place, and time and well-developed, well-nourished, and in no distress. No distress.  HENT:  Right Ear: Tympanic membrane normal.  Left Ear: Tympanic membrane normal.  Nose: Mucosal edema present. No rhinorrhea. Right sinus exhibits no maxillary sinus tenderness and no frontal sinus tenderness. Left sinus exhibits no maxillary sinus tenderness and no frontal sinus tenderness.  Mouth/Throat: Oropharynx is clear and moist and mucous membranes are normal.  Cardiovascular: Normal rate, regular rhythm and normal heart sounds.   Pulmonary/Chest: He has wheezes in the right upper field and the left upper field. He has rhonchi in the right upper field.  Neurological: He is alert and oriented to person, place, and time. GCS score is 15.  Skin: Skin is warm and dry.  Psychiatric: Mood, memory, affect and judgment normal.  Vitals reviewed.   Assessment and Plan :  1. Cough 2. Post-nasal drip 3. Wheezing - chlorpheniramine-HYDROcodone (TUSSIONEX PENNKINETIC ER) 10-8 MG/5ML SUER; Take 5 mLs by mouth every 12 (twelve) hours  as needed for cough.  Dispense: 100 mL; Refill: 0 - Dextromethorphan-Guaifenesin (MUCINEX DM MAXIMUM STRENGTH) 60-1200 MG TB12; Take 1 tablet by mouth every 12 (twelve) hours.  Dispense: 14 each; Refill: 1 - predniSONE (DELTASONE) 20 MG tablet; Take 3 PO QAM x2days, 2 PO QAM x2days, 1 PO QAM x2days  Dispense: 12 tablet; Refill: 0 - Suspect post viral irritation. Continue Dymista. Supportive care: Push fluids, rest. RTC in 5-7 days if no improvement.   4. Chronic seasonal allergic rhinitis, unspecified trigger - montelukast (SINGULAIR) 10 MG tablet; Take 1 tablet (10 mg total) by mouth at bedtime.  Dispense: 30 tablet; Refill: 3 - Pt reports sneezing, runny nose and nasal congestion seasonally. Will treat for allergic rhinitis.    Mercer Pod, PA-C  [facility] [place] 04/22/2016 5:10 PM

## 2016-04-22 NOTE — Patient Instructions (Addendum)
Stay well hydrated. Please come back in 5-7 days if you are not better.   Thank you for coming in today. I hope you feel we met your needs.  Feel free to call [facility] if you have any questions or further requests.  Please consider signing up for MyChart if you do not already have it, as this is a great way to communicate with me.  Best,  Whitney McVey, PA-C   IF you received an x-ray today, you will receive an invoice from [facility]. Please contact [facility] at [phone] with questions or concerns regarding your invoice.   IF you received labwork today, you will receive an invoice from [facility]. Please contact [facility] at [phone] with questions or concerns regarding your invoice.   Our billing staff will not be able to assist you with questions regarding bills from these companies.  You will be contacted with the lab results as soon as they are available. The fastest way to get your results is to activate your My Chart account. Instructions are located on the last page of this paperwork. If you have not heard from [place] regarding the results in 2 weeks, please contact this office.

## 2016-08-18 ENCOUNTER — Other Ambulatory Visit: Payer: Self-pay

## 2016-08-18 NOTE — Telephone Encounter (Signed)
Patient [facility]

## 2016-08-19 NOTE — Telephone Encounter (Signed)
Left voice message for patient to schedule an appointment

## 2016-08-31 ENCOUNTER — Telehealth: Payer: Self-pay

## 2016-08-31 NOTE — Telephone Encounter (Signed)
Patient [facility]

## 2016-09-01 NOTE — Telephone Encounter (Signed)
TRIED CALLING PT AND LEFT MESSAGE TO CALL ABOUT AN APPT BEFORE CAN REFILL MEDICATION

## 2016-10-03 ENCOUNTER — Ambulatory Visit: Payer: Self-pay | Admitting: Family Medicine

## 2016-10-04 ENCOUNTER — Ambulatory Visit (INDEPENDENT_AMBULATORY_CARE_PROVIDER_SITE_OTHER): Payer: BLUE CROSS/BLUE SHIELD | Admitting: Physician Assistant

## 2016-10-04 ENCOUNTER — Encounter: Payer: Self-pay | Admitting: Physician Assistant

## 2016-10-04 ENCOUNTER — Ambulatory Visit (INDEPENDENT_AMBULATORY_CARE_PROVIDER_SITE_OTHER): Payer: BLUE CROSS/BLUE SHIELD

## 2016-10-04 VITALS — BP 146/90 | HR 80 | Temp 97.8°F | Ht 72.0 in | Wt 256.0 lb

## 2016-10-04 DIAGNOSIS — J069 Acute upper respiratory infection, unspecified: Secondary | ICD-10-CM | POA: Diagnosis not present

## 2016-10-04 DIAGNOSIS — R05 Cough: Secondary | ICD-10-CM | POA: Diagnosis not present

## 2016-10-04 MED ORDER — HYDROCOD POLST-CPM POLST ER 10-8 MG/5ML PO SUER: 5.0000 mL | Freq: Every evening | ORAL | 0 refills | Status: DC | PRN

## 2016-10-04 MED ORDER — MONTELUKAST SODIUM 10 MG PO TABS: 10.0000 mg | ORAL_TABLET | Freq: Every day | ORAL | 3 refills | Status: DC

## 2016-10-04 MED ORDER — AMOXICILLIN-POT CLAVULANATE 875-125 MG PO TABS: 1.0000 | ORAL_TABLET | Freq: Two times a day (BID) | ORAL | 0 refills | Status: DC

## 2016-10-04 MED ORDER — PREDNISONE 10 MG PO TABS: 10.0000 mg | ORAL_TABLET | Freq: Every day | ORAL | 0 refills | Status: DC

## 2016-10-04 NOTE — Progress Notes (Signed)
Anthony Miller is a 61 y.o. male here for a new problem.    History of Present Illness:   Chief Complaint  Patient presents with  . Establish Care  . Cough    X3weeks     Acute Concerns: Upper respiratory infection -- has a history of getting bronchitis about 2 x a year. For the past 3 weeks he has experienced myalgias, congestion in nose and chest, cough is productive. Changes in temperature causes him to have coughing fits. Benadryl has helped, OTC cough medicine has not. Uses albuterol inhaler -- once or twice a day. History of PNA about 7-8 years ago but treated as an outpatient. Has never been a smoker. Has seen a lung doctor in the past. No fevers.  No SOB. He works in Art therapist. Has not used Singulair as he is currently out, but states that this typically does help him. Tussionex is the only cough medicine that works for him. Oral prednisone typically helps his symptoms.  Health Maintenance: Immunizations -- flu pending, return when feeling better Weight -- Weight: 256 lb (116.1 kg)   Depression screen [facility] 2/9 10/04/2016  Decreased Interest 0  Down, Depressed, Hopeless 0  PHQ - 2 Score 0   No flowsheet data found.   Past Medical History:  Diagnosis Date  . Arthritis    "lower back/pelvic region" (06/21/2013)  . GERD (gastroesophageal reflux disease)   . Hiatal hernia   . Hypertension   . IBS (irritable bowel syndrome)   . Pneumonia 1970's; ~ 2010  . Status post dilation of esophageal narrowing      Social History   Social History  . Marital status: Married    Spouse name: Melissa  . Number of children: 3  . Years of education: N/A   Occupational History  . 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 Smoking status: Never Smoker  . Smokeless tobacco: Former Systems developer    Types: [place] date: 10/20/1983     Comment: "quit chewing in the 1980's"  . Alcohol use 1.2 oz/week    2 Shots of liquor per week  .  Drug use: No  . Sexual activity: Yes   Other Topics Concern  . Not on file   Social History Narrative   Living and working in [place], two younger daughters still at home   Older son , from previous marriage , not much contact   Licensed Comptroller, EMS, IT trainer    Past Surgical History:  Procedure Laterality Date  . LAPAROSCOPIC CHOLECYSTECTOMY  ~ 1999  . LUMBAR LAMINECTOMY/DECOMPRESSION MICRODISCECTOMY N/A 06/26/2013   Procedure: LUMBAR LAMINECTOMY/DECOMPRESSION MICRODISCECTOMY;  Surgeon: Sinclair Ship, MD;  Location: [facility];  Service: Orthopedics;  Laterality: N/A;  . TONSILLECTOMY  1975    Family History  Problem Relation Age of Onset  . Breast cancer Mother   . Pancreatic cancer Paternal Grandmother     No Known Allergies   Current Medications:   Current Outpatient Prescriptions:  .  Albuterol Sulfate (PROAIR RESPICLICK) 657 (90 Base) MCG/ACT AEPB, Inhale 2 puffs into the lungs every 4 (four) hours as needed., Disp: 1 each, Rfl: 2 .  losartan (COZAAR) 50 MG tablet, Take 1 tablet (50 mg total) by mouth daily., Disp: 90 tablet, Rfl: 3 .  montelukast (SINGULAIR) 10 MG tablet, Take 1 tablet (10 mg total) by mouth at bedtime., Disp: 30 tablet, Rfl: 3 .  omeprazole (  PRILOSEC) 20 MG capsule, Take 1 capsule (20 mg total) by mouth daily., Disp: 30 capsule, Rfl: 3 .  amoxicillin-clavulanate (AUGMENTIN) 875-125 MG tablet, Take 1 tablet by mouth 2 (two) times daily., Disp: 14 tablet, Rfl: 0 .  chlorpheniramine-HYDROcodone (TUSSIONEX PENNKINETIC ER) 10-8 MG/5ML SUER, Take 5 mLs by mouth at bedtime as needed for cough., Disp: 60 mL, Rfl: 0 .  Dextromethorphan-Guaifenesin (MUCINEX DM MAXIMUM STRENGTH) 60-1200 MG TB12, Take 1 tablet by mouth every 12 (twelve) hours. (Patient not taking: Reported on 10/04/2016), Disp: 14 each, Rfl: 1 .  predniSONE (DELTASONE) 10 MG tablet, Take 1 tablet (10 mg total) by mouth daily with breakfast. 3 tablets x 3 days, 2 tablets x  3 days, 1 tablet x 3 days, Disp: 18 tablet, Rfl: 0   Review of Systems:   Review of Systems  Constitutional: Positive for malaise/fatigue. Negative for chills, fever and weight loss.  HENT: Negative for ear discharge, ear pain, hearing loss and tinnitus.   Respiratory: Positive for cough and sputum production. Negative for shortness of breath.   Cardiovascular: Negative for chest pain, palpitations and orthopnea.  Gastrointestinal: Negative for abdominal pain, heartburn, nausea and vomiting.  Neurological: Negative for dizziness, tingling and headaches.  Psychiatric/Behavioral: Negative for depression.    Vitals:   Vitals:   10/04/16 1412  BP: (!) 146/90  Pulse: 80  Temp: 97.8 F (36.6 C)  TempSrc: Oral  SpO2: 96%  Weight: 256 lb (116.1 kg)  Height: 6' (1.829 m)     Body mass index is 34.72 kg/m.  Physical Exam:   Physical Exam  Constitutional: He appears well-developed. He is cooperative.  Non-toxic appearance. He does not have a sickly appearance. He does not appear ill. No distress.  HENT:  Head: Normocephalic and atraumatic.  Right Ear: Tympanic membrane, external ear and ear canal normal. Tympanic membrane is not erythematous, not retracted and not bulging.  Left Ear: Tympanic membrane, external ear and ear canal normal. Tympanic membrane is not erythematous, not retracted and not bulging.  Nose: Mucosal edema and rhinorrhea present. Right sinus exhibits no maxillary sinus tenderness and no frontal sinus tenderness. Left sinus exhibits no maxillary sinus tenderness and no frontal sinus tenderness.  Mouth/Throat: Uvula is midline. Posterior oropharyngeal erythema present. No posterior oropharyngeal edema. Tonsils are 1+ on the right. Tonsils are 1+ on the left.  Eyes: Conjunctivae and lids are normal.  Neck: Trachea normal.  Cardiovascular: Normal rate, regular rhythm, S1 normal, S2 normal, normal heart sounds and normal pulses.   No LE edema  Pulmonary/Chest: Effort  normal. He has no decreased breath sounds. He has no wheezes. He has rhonchi in the right lower field. He has no rales.  Lymphadenopathy:    He has no cervical adenopathy.  Neurological: He is alert. GCS eye subscore is 4. GCS verbal subscore is 5. GCS motor subscore is 6.  Skin: Skin is warm, dry and intact.  Psychiatric: He has a normal mood and affect. His speech is normal and behavior is normal.  Nursing note and vitals reviewed.   CLINICAL DATA:  Productive cough for 3 weeks, works with embalming solutions  EXAM: CHEST  2 VIEW  COMPARISON:  Chest x-ray of 03/11/2016  FINDINGS: No pneumonia or effusion is seen. There is some peribronchial thickening which may indicate bronchitis. Mediastinal and hilar contours are unremarkable. The heart is within normal limits in size. No bony abnormality is seen.  IMPRESSION: No pneumonia.  Possible bronchitis.   Electronically Signed   By: Eddie Dibbles  Alvester Chou M.D.   On: 10/04/2016 16:53  Assessment and Plan:    Madden was seen today for establish care and cough.  Diagnoses and all orders for this visit:  Upper respiratory tract infection, unspecified type Suspect bronchitis given during of symptoms and physical exam. Chest xray to r/o PNA --> chest xray without evidence of PNA, does show possible bronchitis. Will initiate Prednisone taper, Augmentin and Tussionex. Will refill singulair. Advised patient to follow-up with [place] if worsening SOB, fever or other concerning symptoms.  -     DG Chest 2 View; Future  Other orders -     chlorpheniramine-HYDROcodone (TUSSIONEX PENNKINETIC ER) 10-8 MG/5ML SUER; Take 5 mLs by mouth at bedtime as needed for cough. -     amoxicillin-clavulanate (AUGMENTIN) 875-125 MG tablet; Take 1 tablet by mouth 2 (two) times daily. -     predniSONE (DELTASONE) 10 MG tablet; Take 1 tablet (10 mg total) by mouth daily with breakfast. 3 tablets x 3 days, 2 tablets x 3 days, 1 tablet x 3 days -     montelukast  (SINGULAIR) 10 MG tablet; Take 1 tablet (10 mg total) by mouth at bedtime.    . Reviewed expectations re: course of current medical issues. . Discussed self-management of symptoms. . Outlined signs and symptoms indicating need for more acute intervention. . Patient verbalized understanding and all questions were answered. . See orders for this visit as documented in the electronic medical record. . Patient received an After-Visit Summary.  CMA or LPN served as scribe during this visit. History, Physical, and Plan performed by medical provider. Documentation and orders reviewed and attested to.  Inda Coke, PA-C

## 2016-10-04 NOTE — Patient Instructions (Signed)
It was great to meet you!  Please work on pushing fluids.  If you develop fever, shortness of breath or lack of improvement despite treatment, please let us know.

## 2016-10-10 ENCOUNTER — Telehealth: Payer: Self-pay | Admitting: Physician Assistant

## 2016-10-10 NOTE — Telephone Encounter (Signed)
ROI faxed to [facility] Dr. Serita Grit

## 2016-10-27 ENCOUNTER — Ambulatory Visit (INDEPENDENT_AMBULATORY_CARE_PROVIDER_SITE_OTHER): Payer: BLUE CROSS/BLUE SHIELD | Admitting: Physician Assistant

## 2016-10-27 ENCOUNTER — Encounter: Payer: Self-pay | Admitting: Physician Assistant

## 2016-10-27 VITALS — BP 128/90 | HR 72 | Temp 97.9°F | Ht 72.0 in | Wt 250.4 lb

## 2016-10-27 DIAGNOSIS — J209 Acute bronchitis, unspecified: Secondary | ICD-10-CM | POA: Diagnosis not present

## 2016-10-27 MED ORDER — ALBUTEROL SULFATE 108 (90 BASE) MCG/ACT IN AEPB: 2.0000 | INHALATION_SPRAY | RESPIRATORY_TRACT | 2 refills | Status: DC | PRN

## 2016-10-27 MED ORDER — HYDROCOD POLST-CPM POLST ER 10-8 MG/5ML PO SUER: 5.0000 mL | Freq: Every evening | ORAL | 0 refills | Status: DC | PRN

## 2016-10-27 MED ORDER — METHYLPREDNISOLONE ACETATE 80 MG/ML IJ SUSP
80.0000 mg | Freq: Once | INTRAMUSCULAR | Status: AC
  Administered 2016-10-27: 80 mg via INTRAMUSCULAR

## 2016-10-27 MED ORDER — AZITHROMYCIN 250 MG PO TABS: ORAL_TABLET | ORAL | 0 refills | Status: DC

## 2016-10-27 NOTE — Addendum Note (Signed)
Addended by: Marian Sorrow on: 10/27/2016 10:51 AM   Modules accepted: Orders

## 2016-10-27 NOTE — Progress Notes (Signed)
Anthony Miller is a 61 y.o. male here for recurrent URI.  I acted as a Education administrator for [employer], PA-C Anselmo Pickler, LPN  History of Present Illness:   Chief Complaint  Patient presents with  . URI    Recurrent x 6 weeks    URI   This is a recurrent problem. Episode onset: started 6 weeks ago, was better for about a week once off prednisone it came back. The problem has been gradually worsening. Associated symptoms include congestion, coughing, headaches, sinus pain, sneezing and wheezing. Pertinent negatives include no dysuria, ear pain, nausea, neck pain, sore throat or vomiting. Associated symptoms comments: sweating during the day, denies night sweats. Treatments tried: Tussinex cough medicine at night. The treatment provided mild relief.   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 Is using Symbicort scheduled and Albuterol prn. Takes Albuterol maybe 1 x a day. Does not take any antihistamines. Denies SOB.   Past Medical History:  Diagnosis Date  . Arthritis    "lower back/pelvic region" (06/21/2013)  . GERD (gastroesophageal reflux disease)   . Hiatal hernia   . Hypertension   . IBS (irritable bowel syndrome)   . Pneumonia 1970's; ~ 2010  . Status post dilation of esophageal narrowing      Social History   Social History  . Marital status: Married    Spouse name: Melissa  . Number of children: 3  . Years of education: N/A   Occupational History  . 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 Smoking status: Never Smoker  . Smokeless tobacco: Former Systems developer    Types: [place] date: 10/20/1983     Comment: "quit chewing in the 1980's"  . Alcohol use 1.2 oz/week    2 Shots of liquor per week  . Drug use: No  . Sexual activity: Yes   Other Topics Concern  . Not on file   Social History Narrative   Living and working in [place], two younger daughters still at home   Older son , from previous  marriage , not much contact   Licensed Comptroller, EMS, IT trainer    Past Surgical History:  Procedure Laterality Date  . LAPAROSCOPIC CHOLECYSTECTOMY  ~ 1999  . LUMBAR LAMINECTOMY/DECOMPRESSION MICRODISCECTOMY N/A 06/26/2013   Procedure: LUMBAR LAMINECTOMY/DECOMPRESSION MICRODISCECTOMY;  Surgeon: Sinclair Ship, MD;  Location: [facility];  Service: Orthopedics;  Laterality: N/A;  . TONSILLECTOMY  1975    Family History  Problem Relation Age of Onset  . Breast cancer Mother   . Pancreatic cancer Paternal Grandmother     Allergies  Allergen Reactions  . Morphine And Related Rash    Current Medications:   Current Outpatient Prescriptions:  .  Albuterol Sulfate (PROAIR RESPICLICK) 951 (90 Base) MCG/ACT AEPB, Inhale 2 puffs into the lungs every 4 (four) hours as needed., Disp: 1 each, Rfl: 2 .  chlorpheniramine-HYDROcodone (TUSSIONEX PENNKINETIC ER) 10-8 MG/5ML SUER, Take 5 mLs by mouth at bedtime as needed for cough., Disp: 60 mL, Rfl: 0 .  losartan (COZAAR) 50 MG tablet, Take 1 tablet (50 mg total) by mouth daily., Disp: 90 tablet, Rfl: 3 .  montelukast (SINGULAIR) 10 MG tablet, Take 1 tablet (10 mg total) by mouth at bedtime., Disp: 30 tablet, Rfl: 3 .  omeprazole (PRILOSEC) 20 MG capsule, Take 1 capsule (20 mg total) by mouth daily., Disp: 30 capsule, Rfl: 3 .  azithromycin (ZITHROMAX)  250 MG tablet, Take two tablets on day one, then 1 tablet daily for 4 days, Disp: 6 tablet, Rfl: 0   Review of Systems:   Review of Systems  Constitutional: Negative for chills, fever, malaise/fatigue and weight loss.  HENT: Positive for congestion, sinus pain and sneezing. Negative for ear discharge, ear pain and sore throat.   Respiratory: Positive for cough, sputum production and wheezing. Negative for hemoptysis, shortness of breath and stridor.   Gastrointestinal: Negative for nausea and vomiting.  Genitourinary: Negative for dysuria, frequency and urgency.   Musculoskeletal: Negative for myalgias and neck pain.  Neurological: Positive for headaches.  Psychiatric/Behavioral: Negative for depression. The patient is not nervous/anxious.     Vitals:   Vitals:   10/27/16 0844  BP: 128/90  Pulse: 72  Temp: 97.9 F (36.6 C)  TempSrc: Oral  SpO2: 95%  Weight: 250 lb 6.1 oz (113.6 kg)  Height: 6' (1.829 m)     Body mass index is 33.96 kg/m.  Physical Exam:   Physical Exam  Constitutional: He appears well-developed. He is cooperative.  Non-toxic appearance. He does not have a sickly appearance. He does not appear ill. No distress.  HENT:  Head: Normocephalic and atraumatic.  Right Ear: Tympanic membrane, external ear and ear canal normal. Tympanic membrane is not erythematous, not retracted and not bulging.  Left Ear: Tympanic membrane, external ear and ear canal normal. Tympanic membrane is not erythematous, not retracted and not bulging.  Nose: Mucosal edema and rhinorrhea present. No nose lacerations or sinus tenderness. Right sinus exhibits maxillary sinus tenderness. Right sinus exhibits no frontal sinus tenderness. Left sinus exhibits maxillary sinus tenderness. Left sinus exhibits no frontal sinus tenderness.  Mouth/Throat: Uvula is midline and mucous membranes are normal. Posterior oropharyngeal erythema present. No posterior oropharyngeal edema. Tonsils are 0 on the right. Tonsils are 0 on the left. No tonsillar exudate.  Eyes: Conjunctivae and lids are normal.  Neck: Trachea normal.  Cardiovascular: Normal rate, regular rhythm, S1 normal, S2 normal, normal heart sounds and normal pulses.   No LE edema  Pulmonary/Chest: Effort normal and breath sounds normal. No accessory muscle usage. No tachypnea and no bradypnea. No respiratory distress. He has no decreased breath sounds. He has no wheezes. He has no rhonchi. He has no rales.  Dry cough throughout exam; good air movement throughout auscultation  Lymphadenopathy:    He has no  cervical adenopathy.  Neurological: He is alert. GCS eye subscore is 4. GCS verbal subscore is 5. GCS motor subscore is 6.  Skin: Skin is warm, dry and intact.  Psychiatric: He has a normal mood and affect. His speech is normal and behavior is normal.  Nursing note and vitals reviewed.     Assessment and Plan:    Lymon was seen today for uri.  Diagnoses and all orders for this visit:  Acute bronchitis, unspecified organism Suspect likely acute bronchitis, recurrent. 80 mg depomedrol injection today. Start z-pack. Tussionex refilled today as well. Recommended that he start an OTC antihistamine to help with post-nasal drip. Discussed red flags and return to clinic signs/symptoms. Work on pushing fluids and rest as able. Briefly discussed possibility of getting chest xray, however with our shared decision making we decided to hold off on that today, however he understands if/when to return for further evaluation of his symptoms. Return when well for flu shot.  Other orders -     azithromycin (ZITHROMAX) 250 MG tablet; Take two tablets on day one, then 1 tablet  daily for 4 days -     chlorpheniramine-HYDROcodone (TUSSIONEX PENNKINETIC ER) 10-8 MG/5ML SUER; Take 5 mLs by mouth at bedtime as needed for cough. -     Albuterol Sulfate (PROAIR RESPICLICK) 818 (90 Base) MCG/ACT AEPB; Inhale 2 puffs into the lungs every 4 (four) hours as needed.    . Reviewed expectations re: course of current medical issues. . Discussed self-management of symptoms. . Outlined signs and symptoms indicating need for more acute intervention. . Patient verbalized understanding and all questions were answered. . See orders for this visit as documented in the electronic medical record. . Patient received an After-Visit Summary.  CMA or LPN served as scribe during this visit. History, Physical, and Plan performed by medical provider. Documentation and orders reviewed and attested to.  Inda Coke, PA-C

## 2016-10-27 NOTE — Patient Instructions (Signed)
It was great to see you!  Please start the azithromycin antibiotic. I would like for you to start an over the counter antihistamine such as zyrtec, allegra, or claritin (store brand/generic is okay!) Use Tussionex cough syrup as needed for cough as well as albuterol inhaler.  Please let us know if symptoms worsen or do not improve despite treatment. I especially want to know if you develop any fevers, worsening shortness of breath or wheezing.

## 2016-12-15 ENCOUNTER — Encounter: Payer: Self-pay | Admitting: Physician Assistant

## 2016-12-15 ENCOUNTER — Ambulatory Visit (INDEPENDENT_AMBULATORY_CARE_PROVIDER_SITE_OTHER): Payer: 59 | Admitting: Physician Assistant

## 2016-12-15 VITALS — BP 130/80 | HR 82 | Temp 98.0°F | Ht 72.0 in | Wt 252.0 lb

## 2016-12-15 DIAGNOSIS — Z23 Encounter for immunization: Secondary | ICD-10-CM | POA: Diagnosis not present

## 2016-12-15 DIAGNOSIS — J302 Other seasonal allergic rhinitis: Secondary | ICD-10-CM | POA: Diagnosis not present

## 2016-12-15 DIAGNOSIS — G4700 Insomnia, unspecified: Secondary | ICD-10-CM | POA: Diagnosis not present

## 2016-12-15 DIAGNOSIS — Z79899 Other long term (current) drug therapy: Secondary | ICD-10-CM

## 2016-12-15 LAB — BASIC METABOLIC PANEL
BUN: 12 mg/dL (ref 6–23)
CHLORIDE: 103 meq/L (ref 96–112)
CO2: 31 mEq/L (ref 19–32)
Calcium: 9.6 mg/dL (ref 8.4–10.5)
Creatinine, Ser: 1.15 mg/dL (ref 0.40–1.50)
GFR: 69.19 mL/min (ref >60.00)
GLUCOSE: 92 mg/dL (ref 70–99)
Potassium: 4.3 mEq/L (ref 3.5–5.1)
SODIUM: 140 meq/L (ref 135–145)

## 2016-12-15 MED ORDER — AMITRIPTYLINE HCL 50 MG PO TABS: 50.0000 mg | ORAL_TABLET | Freq: Every day | ORAL | 1 refills | Status: DC

## 2016-12-15 NOTE — Progress Notes (Signed)
Anthony Miller is a 61 y.o. male is here to discuss: Medication refill.  I acted as a Education administrator for [employer], PA-C Anselmo Pickler, LPN  History of Present Illness:   Chief Complaint  Patient presents with  . Insomnia  . Allergic Rhinitis     Medication Refill  This is a new problem. Episode onset: Pt has been taking Amitriptyline for insomnia, was prescribed by another provider. The problem occurs constantly. The treatment provided significant relief.  Takes Elavil 50 mg to help with shift work sleeping, was initially was put on for IBS and while going through a divorce. He states that he has been on this medication for several years, almost 33. He tolerates this medication well. Denies SI/HI.  Allergic Rhinitis He has been taking Claritin with great relief, however he finds that it is very expensive, wondering if he can get a prescription for this. He finds that it is the only thing that can help with his post-nasal drip.   Health Maintenance Due  Topic Date Due  . INFLUENZA VACCINE  09/14/2016    Past Medical History:  Diagnosis Date  . Arthritis    "lower back/pelvic region" (06/21/2013)  . GERD (gastroesophageal reflux disease)   . Hiatal hernia   . Hypertension   . IBS (irritable bowel syndrome)   . Pneumonia 1970's; ~ 2010  . Status post dilation of esophageal narrowing      Social History   Social History  . Marital status: Married    Spouse name: Melissa  . Number of children: 3  . Years of education: N/A   Occupational History  . 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 Smoking status: Never Smoker  . Smokeless tobacco: Former Systems developer    Types: [place] date: 10/20/1983     Comment: "quit chewing in the 1980's"  . Alcohol use 1.2 oz/week    2 Shots of liquor per week  . Drug use: No  . Sexual activity: Yes   Other Topics Concern  . Not on file   Social History Narrative   Living and  working in [place], two younger daughters still at home   Older son , from previous marriage , not much contact   Licensed Comptroller, EMS, IT trainer    Past Surgical History:  Procedure Laterality Date  . LAPAROSCOPIC CHOLECYSTECTOMY  ~ 1999  . LUMBAR LAMINECTOMY/DECOMPRESSION MICRODISCECTOMY N/A 06/26/2013   Procedure: LUMBAR LAMINECTOMY/DECOMPRESSION MICRODISCECTOMY;  Surgeon: Sinclair Ship, MD;  Location: [facility];  Service: Orthopedics;  Laterality: N/A;  . TONSILLECTOMY  1975    Family History  Problem Relation Age of Onset  . Breast cancer Mother   . Pancreatic cancer Paternal Grandmother     PMHx, SurgHx, SocialHx, FamHx, Medications, and Allergies were reviewed in the Visit Navigator and updated as appropriate.   Patient Active Problem List   Diagnosis Date Noted  . 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 Lipoma of anterior chest wall 03/11/2016  . Elevated hemoglobin ([place]) 10/25/2015  . Hypertriglyceridemia 10/25/2015  . History of esophageal stricture 10/07/2015  . IBS (irritable bowel syndrome) 10/07/2015  . Dyslipidemia 10/07/2015  . Insomnia 10/07/2015  . Radiculopathy 06/26/2013  . History of back surgery 06/21/2013  . HTN (hypertension) 06/21/2013  . GERD (gastroesophageal reflux disease) 06/02/2008  . Perennial allergic rhinitis with seasonal variation 05/08/2008    Social History  Substance Use Topics  .  Smoking status: Never Smoker  . Smokeless tobacco: Former Systems developer    Types: [place] date: 10/20/1983     Comment: "quit chewing in the 1980's"  . Alcohol use 1.2 oz/week    2 Shots of liquor per week    Current Medications and Allergies:    Current Outpatient Prescriptions:  .  loratadine (CLARITIN) 10 MG tablet, Take 10 mg by mouth daily., Disp: , Rfl:  .  losartan (COZAAR) 50 MG tablet, Take 1 tablet (50 mg total) by mouth daily., Disp: 90 tablet, Rfl: 3 .  omeprazole (PRILOSEC) 20 MG capsule, Take 1 capsule (20 mg total) by  mouth daily., Disp: 30 capsule, Rfl: 3 .  Albuterol Sulfate (PROAIR RESPICLICK) 063 (90 Base) MCG/ACT AEPB, Inhale 2 puffs into the lungs every 4 (four) hours as needed. (Patient not taking: Reported on 12/15/2016), Disp: 1 each, Rfl: 2 .  amitriptyline (ELAVIL) 50 MG tablet, Take 1 tablet (50 mg total) by mouth at bedtime., Disp: 90 tablet, Rfl: 1   Allergies  Allergen Reactions  . Morphine And Related Rash    Review of Systems   ROS  Negative unless otherwise specified per HPI.  Vitals:   Vitals:   12/15/16 0831  BP: 130/80  Pulse: 82  Temp: 98 F (36.7 C)  TempSrc: Oral  SpO2: 95%  Weight: 252 lb (114.3 kg)  Height: 6' (1.829 m)     Body mass index is 34.18 kg/m.   Physical Exam:    Physical Exam  Constitutional: He appears well-developed. He is cooperative.  Non-toxic appearance. He does not have a sickly appearance. He does not appear ill. No distress.  HENT:  Head: Normocephalic and atraumatic.  Right Ear: Tympanic membrane, external ear and ear canal normal. Tympanic membrane is not erythematous, not retracted and not bulging.  Left Ear: Tympanic membrane, external ear and ear canal normal. Tympanic membrane is not erythematous, not retracted and not bulging.  Nose: Mucosal edema and rhinorrhea present. Right sinus exhibits no maxillary sinus tenderness and no frontal sinus tenderness. Left sinus exhibits no maxillary sinus tenderness and no frontal sinus tenderness.  Mouth/Throat: Uvula is midline. No posterior oropharyngeal edema or posterior oropharyngeal erythema.  Eyes: Conjunctivae and lids are normal.  Neck: Trachea normal.  Cardiovascular: Normal rate, regular rhythm, S1 normal, S2 normal, normal heart sounds and normal pulses.   No LE edema  Pulmonary/Chest: Effort normal and breath sounds normal. He has no decreased breath sounds. He has no wheezes. He has no rhonchi. He has no rales.  Lymphadenopathy:    He has no cervical adenopathy.  Neurological:  He is alert. GCS eye subscore is 4. GCS verbal subscore is 5. GCS motor subscore is 6.  Skin: Skin is warm, dry and intact.  Psychiatric: He has a normal mood and affect. His speech is normal and behavior is normal.  Pleasant and cooperative  Nursing note and vitals reviewed.    Assessment and Plan:    Claude was seen today for insomnia and allergic rhinitis .  Diagnoses and all orders for this visit:  Insomnia, unspecified type and Long-term use of high-risk medication Will check BMP today, as it has been >1 year since his electrolytes, kidney function and blood sugar has been checked. Refill Amytriptyline x 6 months. Follow-up if symptoms worsen or persist despite treatment or other concerns. Patient agreeable to plan. -     Basic metabolic panel  Need for prophylactic vaccination and inoculation against influenza -  Flu Vaccine QUAD 36+ mos IM  Seasonal allergies Provided list of OTC medication and pricing for outpatient [facility] pharmacy, including Claritin #90 is less than $5. Patient is agreeable to this instead of prescription. Advised that he follow up if symptoms worsen or persist despite treatment.  Other orders -     amitriptyline (ELAVIL) 50 MG tablet; Take 1 tablet (50 mg total) by mouth at bedtime.  I recommended that he follow-up in about 6 months for a CPE.  Marland Kitchen Reviewed expectations re: course of current medical issues. . Discussed self-management of symptoms. . Outlined signs and symptoms indicating need for more acute intervention. . Patient verbalized understanding and all questions were answered. . See orders for this visit as documented in the electronic medical record. . Patient received an After Visit Summary.  CMA or LPN served as scribe during this visit. History, Physical, and Plan performed by medical provider. Documentation and orders reviewed and attested to.  Inda Coke, PA-C [place], [facility] 12/15/2016  Follow-up: Return in about 6  months (around 06/14/2017) for CPE.

## 2016-12-15 NOTE — Patient Instructions (Addendum)
It was great to see you!  We will call you about your labwork.  Please visit the [facility] to get better deals on your over the counter medications.  Let's schedule a physical in 6 months or so, after I return for maternity leave.

## 2016-12-19 ENCOUNTER — Telehealth: Payer: Self-pay | Admitting: Physician Assistant

## 2016-12-19 NOTE — Telephone Encounter (Signed)
See result notes. 

## 2016-12-19 NOTE — Telephone Encounter (Signed)
Patient calling to check on lab results. Patient asked for return phone call at your convenience. Confirmed best call back number is one listed.

## 2017-01-11 NOTE — Telephone Encounter (Signed)
No records from [facility]

## 2017-01-21 IMAGING — DX DG CHEST 2V
2 series · 2 of 2 positions shown · non-contrast
Comparison: 07/22/2010

CLINICAL DATA: Cough, shortness of breath for 5 weeks

EXAM:
CHEST  2 VIEW

[chest pa]
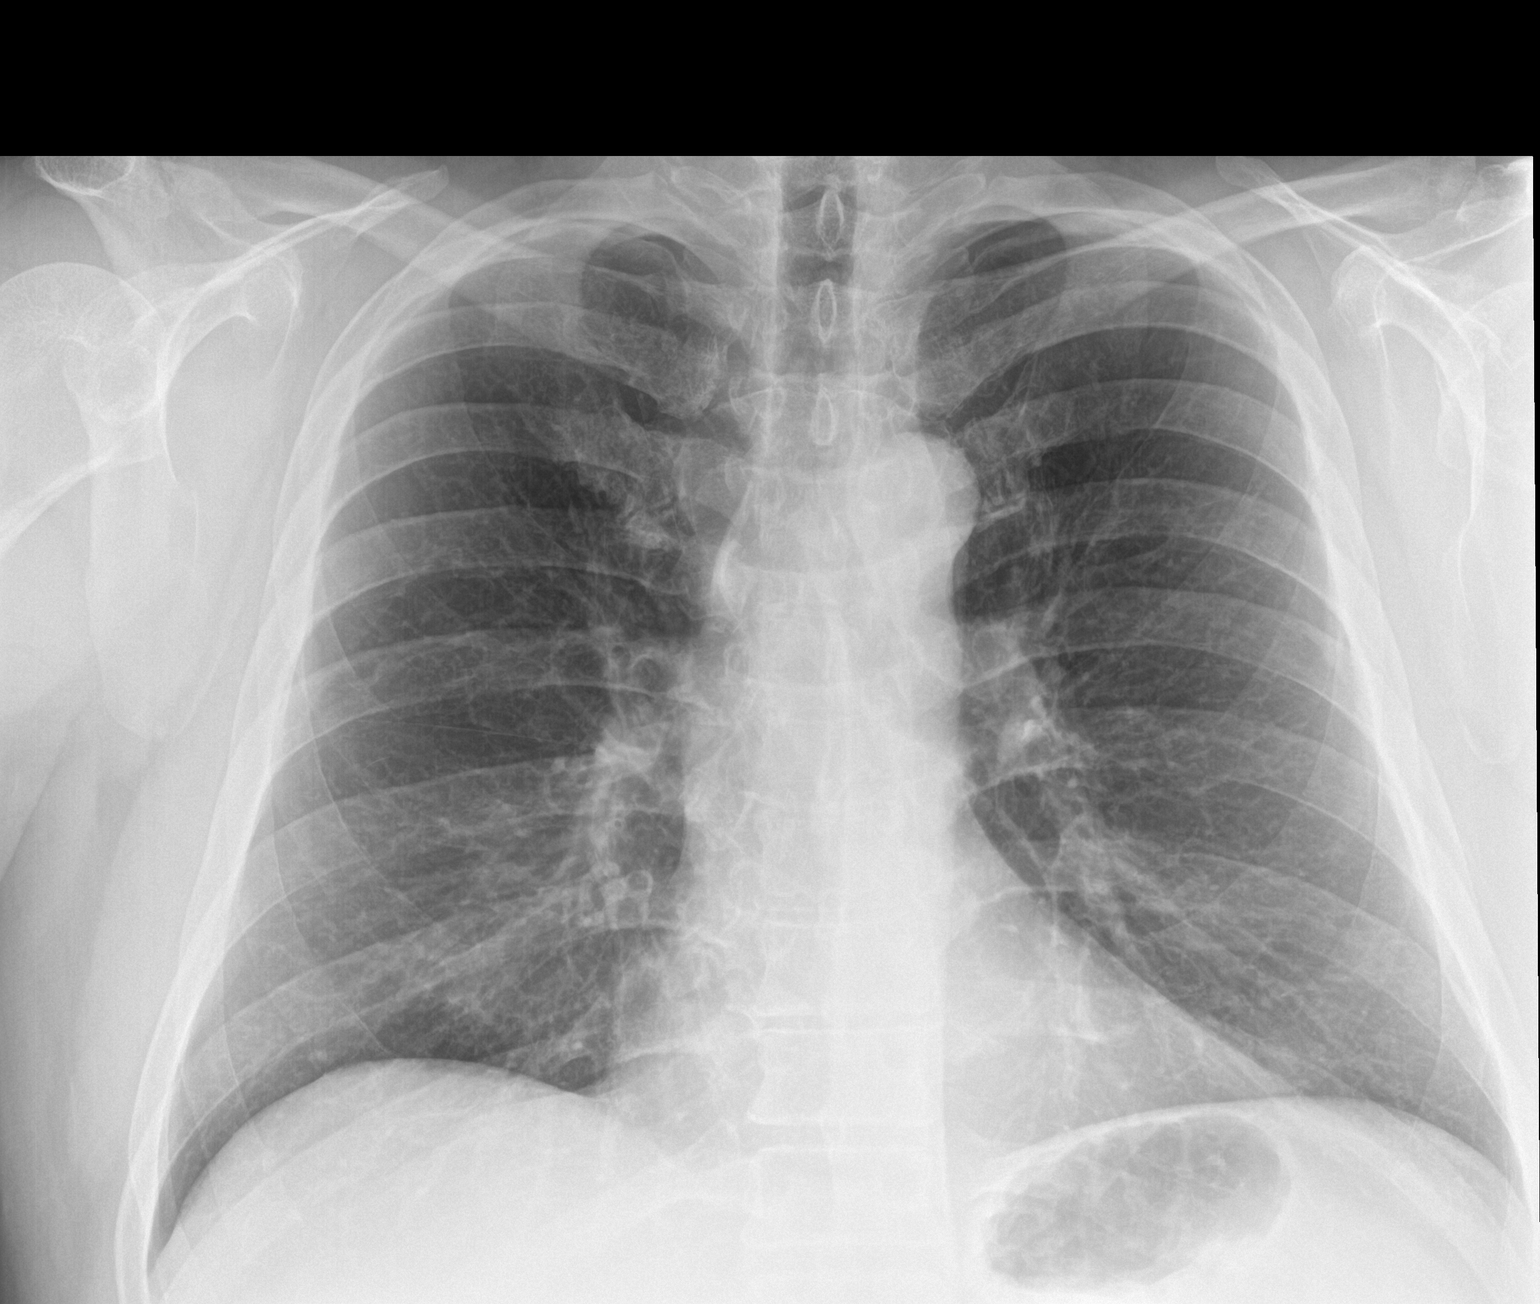

[chest lat]
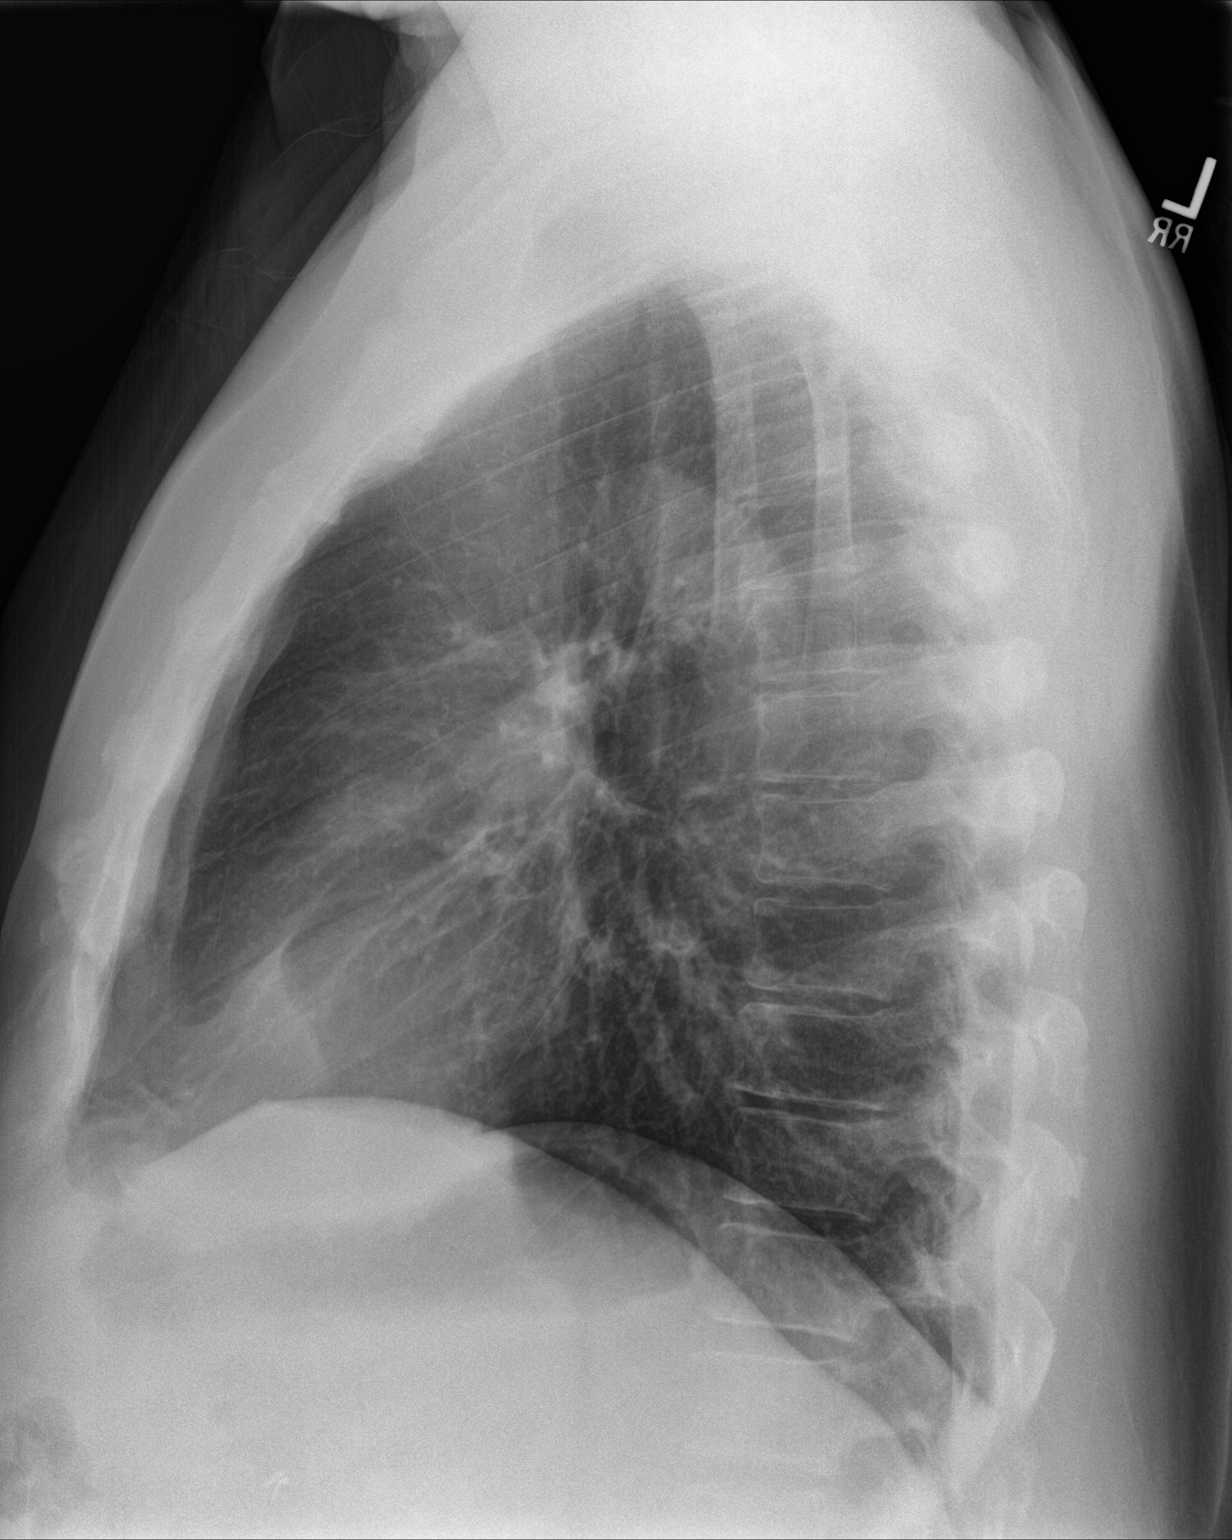

[2 of 2 positions shown; findings below may reference images not displayed]

FINDINGS: The heart size and mediastinal contours are within normal limits.
Both lungs are clear. The visualized skeletal structures are
unremarkable.
IMPRESSION: No active cardiopulmonary disease.

## 2017-02-26 IMAGING — CR DG CHEST 2V
1 series · 2 of 2 positions shown · non-contrast
Comparison: 02/04/2016

CLINICAL DATA: Cough

EXAM:
CHEST  2 VIEW

[Series 1: dg chest 2 view · 0.14mm/px · 2 of 2 slices shown]
[im 1/2]
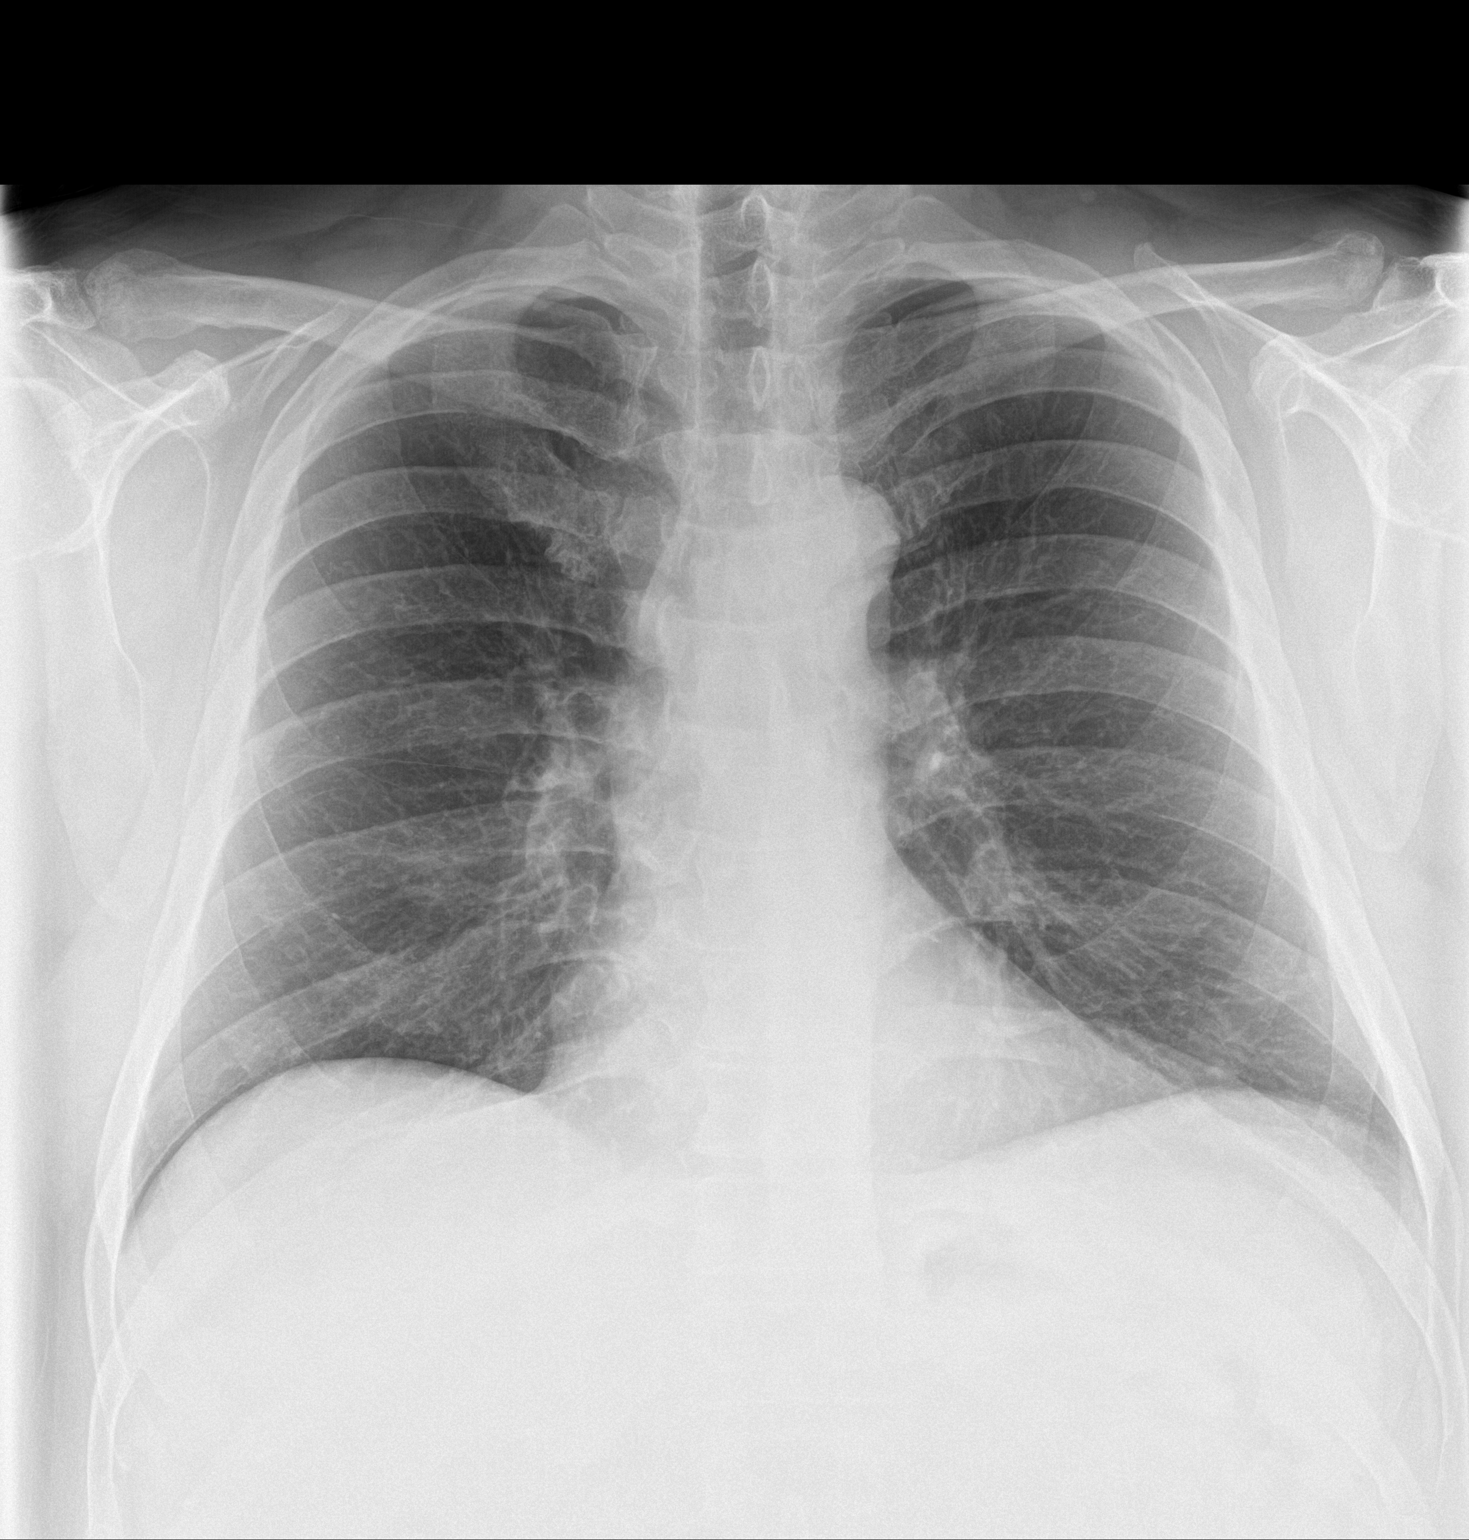
[im 2/2]
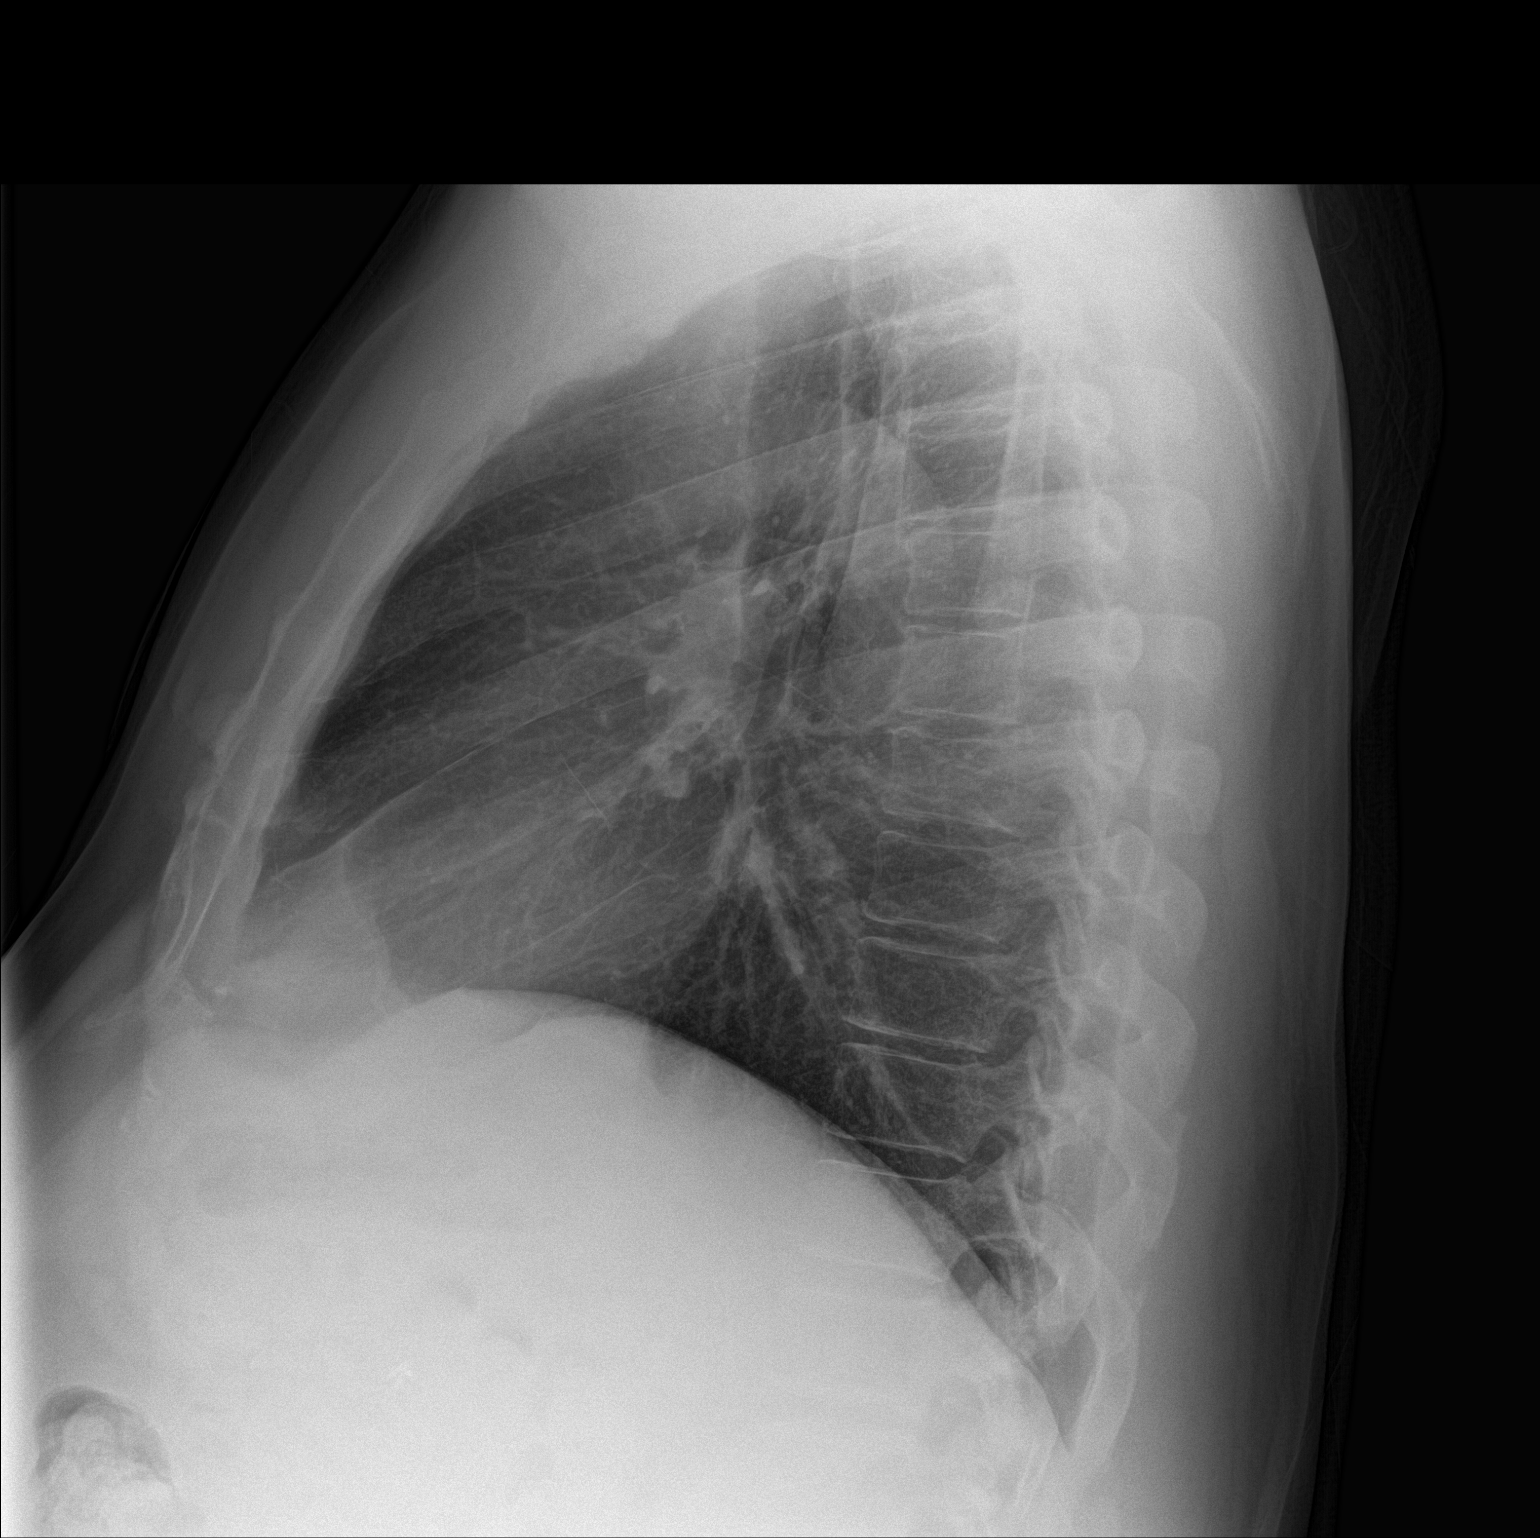

[2 of 2 positions shown; findings below may reference images not displayed]

FINDINGS: Heart and mediastinal contours are within normal limits. No focal
opacities or effusions. No acute bony abnormality.
IMPRESSION: No active cardiopulmonary disease.

## 2017-03-06 ENCOUNTER — Telehealth: Payer: Self-pay | Admitting: Family Medicine

## 2017-03-06 DIAGNOSIS — I10 Essential (primary) hypertension: Secondary | ICD-10-CM

## 2017-03-06 NOTE — Telephone Encounter (Signed)
Refill request for Hypertension medication:  Losartan 50 mg  Last office visit pertaining to hypertension: 02/24/2016  BP Readings from Last 3 Encounters:  12/15/16 130/80  10/27/16 128/90  10/04/16 (!) 146/90     Lab Results  Component Value Date   CREATININE 1.15 12/15/2016   BUN 12 12/15/2016   NA 140 12/15/2016   K 4.3 12/15/2016   CL 103 12/15/2016   CO2 31 12/15/2016     No Follow-up on file.

## 2017-03-06 NOTE — Telephone Encounter (Signed)
He is no longer a patient of [facility]. It looks like he transferred to [facility]

## 2017-03-24 ENCOUNTER — Other Ambulatory Visit: Payer: Self-pay | Admitting: Family Medicine

## 2017-03-24 DIAGNOSIS — I10 Essential (primary) hypertension: Secondary | ICD-10-CM

## 2017-04-19 ENCOUNTER — Other Ambulatory Visit: Payer: Self-pay | Admitting: Family Medicine

## 2017-04-19 DIAGNOSIS — I10 Essential (primary) hypertension: Secondary | ICD-10-CM

## 2017-04-20 NOTE — Telephone Encounter (Signed)
I have provided prescription of 30 tabs with 1 refill.  Needs office visit for further refills.  Inda Coke PA-C

## 2017-05-12 ENCOUNTER — Encounter: Payer: Self-pay | Admitting: Physician Assistant

## 2017-05-12 ENCOUNTER — Ambulatory Visit (INDEPENDENT_AMBULATORY_CARE_PROVIDER_SITE_OTHER): Payer: BLUE CROSS/BLUE SHIELD | Admitting: Physician Assistant

## 2017-05-12 VITALS — BP 156/100 | HR 77 | Temp 97.7°F | Ht 72.0 in | Wt 259.4 lb

## 2017-05-12 DIAGNOSIS — E785 Hyperlipidemia, unspecified: Secondary | ICD-10-CM

## 2017-05-12 DIAGNOSIS — G4700 Insomnia, unspecified: Secondary | ICD-10-CM

## 2017-05-12 DIAGNOSIS — I10 Essential (primary) hypertension: Secondary | ICD-10-CM | POA: Diagnosis not present

## 2017-05-12 LAB — BASIC METABOLIC PANEL
BUN: 17 mg/dL (ref 6–23)
CHLORIDE: 105 meq/L (ref 96–112)
CO2: 26 meq/L (ref 19–32)
CREATININE: 1.24 mg/dL (ref 0.40–1.50)
Calcium: 9.4 mg/dL (ref 8.4–10.5)
GFR: 63.34 mL/min (ref >60.00)
Glucose, Bld: 110 mg/dL — ABNORMAL HIGH (ref 70–99)
Potassium: 4.3 mEq/L (ref 3.5–5.1)
Sodium: 138 mEq/L (ref 135–145)

## 2017-05-12 LAB — LIPID PANEL
CHOL/HDL RATIO: 4
Cholesterol: 128 mg/dL (ref 0–200)
HDL: 31.1 mg/dL — AB (ref >39.00)
LDL CALC: 80 mg/dL (ref 0–99)
NonHDL: 96.84
TRIGLYCERIDES: 83 mg/dL (ref 0.0–149.0)
VLDL: 16.6 mg/dL (ref 0.0–40.0)

## 2017-05-12 MED ORDER — LOSARTAN POTASSIUM 50 MG PO TABS
50.0000 mg | ORAL_TABLET | Freq: Every day | ORAL | 1 refills | Status: DC
Start: 2017-05-12 — End: 2017-06-15

## 2017-05-12 MED ORDER — AMITRIPTYLINE HCL 50 MG PO TABS: 50.0000 mg | ORAL_TABLET | Freq: Every day | ORAL | 1 refills | Status: DC

## 2017-05-12 NOTE — Patient Instructions (Signed)
It was great to see you!  Let's follow-up in 3 months for a physical.  Keep an eye on blood pressure - if consistently >140/90, please let me know.

## 2017-05-12 NOTE — Assessment & Plan Note (Signed)
Re-check BMP today. Continue Elavil 50 mg q hs.

## 2017-05-12 NOTE — Assessment & Plan Note (Signed)
He has started low carb diet. Re-check lipid panel today and re-calculate ASCVD. Currently not on statin.

## 2017-05-12 NOTE — Progress Notes (Signed)
Anthony Miller is a 62 y.o. male is here to discuss: Medication refill.  I acted as a Education administrator for [employer], PA-C Anselmo Pickler, LPN  History of Present Illness:   Chief Complaint  Patient presents with  . Medication Refill    Losartan Amitriptyline    HPI  There are no preventive care reminders to display for this patient.   Discussed Shingrix vaccine with pt. He will check with insurance and let us know.  His father passed away this month after a fall, sustained a SDH. Currently taking Losartan 50 mg. At home blood pressure readings are: not checking. Patient denies chest pain, SOB, blurred vision, dizziness, unusual headaches, lower leg swelling. Patient is not compliant with medication as he has been without his meds. Denies excessive caffeine intake, stimulant usage, excessive alcohol intake, or increase in salt consumption.  He is doing a very low carbohydrate diet, started Monday. Also taking some supplements, he is unsure of what all he is taking, one of them is Gaffer.  Elavil (Amytriptyline) 50 mg -- has been using to help wind down and sleep at night. Has been without for two months. Has taken this medication for several years.   Past Medical History:  Diagnosis Date  . Arthritis    "lower back/pelvic region" (06/21/2013)  . GERD (gastroesophageal reflux disease)   . Hiatal hernia   . Hypertension   . IBS (irritable bowel syndrome)   . Pneumonia 1970's; ~ 2010  . Status post dilation of esophageal narrowing      Social History   Socioeconomic History  . Marital status: Married    Spouse name: Melissa  . Number of children: 3  . Years of education: Not on file  . Highest education level: Not on file  Occupational History  . 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 Financial resource strain: Not on file  . Food insecurity:    Worry: Not on file    Inability: Not on file  .  Transportation needs:    Medical: Not on file    Non-medical: Not on file  Tobacco Use  . Smoking status: Never Smoker  . Smokeless tobacco: Former Systems developer    Types: Chew  . Tobacco comment: "quit chewing in the 1980's"  Substance and Sexual Activity  . Alcohol use: Yes    Alcohol/week: 1.2 oz    Types: 2 Shots of liquor per week  . Drug use: No  . Sexual activity: Yes  Lifestyle  . Physical activity:    Days per week: Not on file    Minutes per session: Not on file  . Stress: Not on file  Relationships  . Social connections:    Talks on phone: Not on file    Gets together: Not on file    Attends religious service: Not on file    Active member of club or organization: Not on file    Attends meetings of clubs or organizations: Not on file    Relationship status: Not on file  . Intimate partner violence:    Fear of current or ex partner: Not on file    Emotionally abused: Not on file    Physically abused: Not on file    Forced sexual activity: Not on file  Other Topics Concern  . Not on file  Social History Narrative   Living and working in [place], two younger daughters still at home  Older son , from previous marriage , not much contact   Licensed Comptroller, EMS, IT trainer    Past Surgical History:  Procedure Laterality Date  . LAPAROSCOPIC CHOLECYSTECTOMY  ~ 1999  . LUMBAR LAMINECTOMY/DECOMPRESSION MICRODISCECTOMY N/A 06/26/2013   Procedure: LUMBAR LAMINECTOMY/DECOMPRESSION MICRODISCECTOMY;  Surgeon: Sinclair Ship, MD;  Location: [facility];  Service: Orthopedics;  Laterality: N/A;  . TONSILLECTOMY  1975    Family History  Problem Relation Age of Onset  . Breast cancer Mother   . Pancreatic cancer Paternal Grandmother     PMHx, SurgHx, SocialHx, FamHx, Medications, and Allergies were reviewed in the Visit Navigator and updated as appropriate.   Patient Active Problem List   Diagnosis Date Noted  . 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 Lipoma  of anterior chest wall 03/11/2016  . Elevated hemoglobin ([place]) 10/25/2015  . Hypertriglyceridemia 10/25/2015  . History of esophageal stricture 10/07/2015  . IBS (irritable bowel syndrome) 10/07/2015  . Dyslipidemia 10/07/2015  . Insomnia 10/07/2015  . Radiculopathy 06/26/2013  . History of back surgery 06/21/2013  . HTN (hypertension) 06/21/2013  . GERD (gastroesophageal reflux disease) 06/02/2008  . Perennial allergic rhinitis with seasonal variation 05/08/2008    Social History   Tobacco Use  . Smoking status: Never Smoker  . Smokeless tobacco: Former Systems developer    Types: Chew  . Tobacco comment: "quit chewing in the 1980's"  Substance Use Topics  . Alcohol use: Yes    Alcohol/week: 1.2 oz    Types: 2 Shots of liquor per week  . Drug use: No    Current Medications and Allergies:    Current Outpatient Medications:  .  amitriptyline (ELAVIL) 50 MG tablet, Take 1 tablet (50 mg total) by mouth at bedtime., Disp: 90 tablet, Rfl: 1 .  loratadine (CLARITIN) 10 MG tablet, Take 10 mg by mouth daily., Disp: , Rfl:  .  losartan (COZAAR) 50 MG tablet, Take 1 tablet (50 mg total) by mouth daily., Disp: 90 tablet, Rfl: 1 .  omeprazole (PRILOSEC) 20 MG capsule, Take 1 capsule (20 mg total) by mouth daily., Disp: 30 capsule, Rfl: 3   Allergies  Allergen Reactions  . Morphine And Related Rash    Review of Systems   ROS Negative unless otherwise specified per HPI.  Vitals:   Vitals:   05/12/17 0826  BP: (!) 156/100  Pulse: 77  Temp: 97.7 F (36.5 C)  TempSrc: Oral  SpO2: 97%  Weight: 259 lb 6.1 oz (117.7 kg)  Height: 6' (1.829 m)     Body mass index is 35.18 kg/m.   Physical Exam:    Physical Exam  Constitutional: He appears well-developed. He is cooperative.  Non-toxic appearance. He does not have a sickly appearance. He does not appear ill. No distress.  Cardiovascular: Normal rate, regular rhythm, S1 normal, S2 normal, normal heart sounds and normal pulses.  No LE  edema  Pulmonary/Chest: Effort normal and breath sounds normal.  Neurological: He is alert. GCS eye subscore is 4. GCS verbal subscore is 5. GCS motor subscore is 6.  Skin: Skin is warm, dry and intact.  Psychiatric: He has a normal mood and affect. His speech is normal and behavior is normal.  Nursing note and vitals reviewed.    Assessment and Plan:    Problem List Items Addressed This Visit      Cardiovascular and Mediastinum   HTN (hypertension) - Primary    Currently uncontrolled. Has been without medication x 1 month. Also dealing with personal stress.  Refilled medication today. BMP today. Follow-up in 3 months for physical. Was instructed to monitor BP and if consistently >140/90, to let us know.      Relevant Medications   losartan (COZAAR) 50 MG tablet   Other Relevant Orders   Basic metabolic panel   Lipid panel     Other   Dyslipidemia    He has started low carb diet. Re-check lipid panel today and re-calculate ASCVD. Currently not on statin.      Relevant Orders   Lipid panel   Insomnia    Re-check BMP today. Continue Elavil 50 mg q hs.          . Reviewed expectations re: course of current medical issues. . Discussed self-management of symptoms. . Outlined signs and symptoms indicating need for more acute intervention. . Patient verbalized understanding and all questions were answered. . See orders for this visit as documented in the electronic medical record. . Patient received an After Visit Summary.  CMA or LPN served as scribe during this visit. History, Physical, and Plan performed by medical provider. Documentation and orders reviewed and attested to.  Inda Coke, PA-C [place] 05/12/2017  Follow-up: Return in about 3 months (around 08/12/2017) for CPE.

## 2017-05-12 NOTE — Assessment & Plan Note (Signed)
Currently uncontrolled. Has been without medication x 1 month. Also dealing with personal stress. Refilled medication today. BMP today. Follow-up in 3 months for physical. Was instructed to monitor BP and if consistently >140/90, to let us know.

## 2017-06-15 ENCOUNTER — Encounter: Payer: Self-pay | Admitting: Physician Assistant

## 2017-06-15 ENCOUNTER — Ambulatory Visit (INDEPENDENT_AMBULATORY_CARE_PROVIDER_SITE_OTHER): Payer: BLUE CROSS/BLUE SHIELD | Admitting: Physician Assistant

## 2017-06-15 VITALS — BP 130/90 | HR 73 | Temp 98.6°F | Ht 72.0 in | Wt 254.0 lb

## 2017-06-15 DIAGNOSIS — I10 Essential (primary) hypertension: Secondary | ICD-10-CM

## 2017-06-15 DIAGNOSIS — J069 Acute upper respiratory infection, unspecified: Secondary | ICD-10-CM

## 2017-06-15 DIAGNOSIS — R739 Hyperglycemia, unspecified: Secondary | ICD-10-CM

## 2017-06-15 LAB — BASIC METABOLIC PANEL
BUN: 14 mg/dL (ref 6–23)
CHLORIDE: 103 meq/L (ref 96–112)
CO2: 28 meq/L (ref 19–32)
CREATININE: 1.33 mg/dL (ref 0.40–1.50)
Calcium: 9.7 mg/dL (ref 8.4–10.5)
GFR: 58.4 mL/min — ABNORMAL LOW (ref >60.00)
GLUCOSE: 86 mg/dL (ref 70–99)
POTASSIUM: 5 meq/L (ref 3.5–5.1)
Sodium: 139 mEq/L (ref 135–145)

## 2017-06-15 LAB — HEMOGLOBIN A1C: Hgb A1c MFr Bld: 5.7 % (ref 4.6–6.5)

## 2017-06-15 MED ORDER — PREDNISONE 20 MG PO TABS: 20.0000 mg | ORAL_TABLET | Freq: Every day | ORAL | 0 refills | Status: DC

## 2017-06-15 MED ORDER — LOSARTAN POTASSIUM 100 MG PO TABS: 100.0000 mg | ORAL_TABLET | Freq: Every day | ORAL | 1 refills | Status: DC

## 2017-06-15 MED ORDER — AMOXICILLIN-POT CLAVULANATE 875-125 MG PO TABS: 1.0000 | ORAL_TABLET | Freq: Two times a day (BID) | ORAL | 0 refills | Status: DC

## 2017-06-15 NOTE — Patient Instructions (Signed)
It was great to see you!  Start the oral antibiotic today.   I'm going to increase your blood pressure medicine to 100 mg a day, may continue to take at the regular scheduled time you take it.  If you feel like you need it, you may begin the steroid in a few days.  Follow-up for blood pressure in 1 month, sooner if symptoms (headache, dizziness, etc)  Follow-up for upper respiratory infection at your discretion, if worsening or symptoms persist  I will be in touch with you regarding your labs

## 2017-06-15 NOTE — Progress Notes (Signed)
Anthony Miller is a 62 y.o. male is here to discuss: Hypertension  I acted as a Education administrator for [employer], PA-C Anselmo Pickler, LPN  History of Present Illness:   Chief Complaint  Patient presents with  . Hypertension  . Cough    Hypertension  This is a chronic problem. Episode onset: Pt here to follow up on blood pressure the past month Bp has been on average 140/93 Pt has only checked 4-5 times since here last 3/29. The problem has been gradually worsening since onset. The problem is uncontrolled. Associated symptoms include headaches. Pertinent negatives include no shortness of breath. Risk factors for coronary artery disease include dyslipidemia, obesity and male gender. The current treatment provides no improvement. There are no compliance problems.   Cough  This is a new problem. Episode onset: started 2 weeks ago. The problem has been gradually worsening. The problem occurs every few hours. The cough is productive of sputum (Expectorating yellow/green sputum). Associated symptoms include headaches, nasal congestion and postnasal drip. Pertinent negatives include no chills, fever, sore throat, shortness of breath or wheezing. Associated symptoms comments: Chest congestion. Nothing aggravates the symptoms. He has tried nothing for the symptoms. The treatment provided no relief. His past medical history is significant for bronchitis and pneumonia.   He does endorse at least weekly indulgences in margaritas and taco Tuesday. He is hoping to work on decreasing his intake of this.  Wt Readings from Last 5 Encounters:  06/15/17 254 lb (115.2 kg)  05/12/17 259 lb 6.1 oz (117.7 kg)  12/15/16 252 lb (114.3 kg)  10/27/16 250 lb 6.1 oz (113.6 kg)  10/04/16 256 lb (116.1 kg)   We are also following up today on elevated glucose, was 110 at last blood draw. He is agreeable to check HgbA1c today.  There are no preventive care reminders to display for this patient.  Past Medical  History:  Diagnosis Date  . Arthritis    "lower back/pelvic region" (06/21/2013)  . GERD (gastroesophageal reflux disease)   . Hiatal hernia   . Hypertension   . IBS (irritable bowel syndrome)   . Pneumonia 1970's; ~ 2010  . Status post dilation of esophageal narrowing      Social History   Socioeconomic History  . Marital status: Married    Spouse name: Melissa  . Number of children: 3  . Years of education: Not on file  . Highest education level: Not on file  Occupational History  . 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 Financial resource strain: Not on file  . Food insecurity:    Worry: Not on file    Inability: Not on file  . Transportation needs:    Medical: Not on file    Non-medical: Not on file  Tobacco Use  . Smoking status: Never Smoker  . Smokeless tobacco: Former Systems developer    Types: Chew  . Tobacco comment: "quit chewing in the 1980's"  Substance and Sexual Activity  . Alcohol use: Yes    Alcohol/week: 1.2 oz    Types: 2 Shots of liquor per week  . Drug use: No  . Sexual activity: Yes  Lifestyle  . Physical activity:    Days per week: Not on file    Minutes per session: Not on file  . Stress: Not on file  Relationships  . Social connections:    Talks on phone: Not on file    Gets together:  Not on file    Attends religious service: Not on file    Active member of club or organization: Not on file    Attends meetings of clubs or organizations: Not on file    Relationship status: Not on file  . Intimate partner violence:    Fear of current or ex partner: Not on file    Emotionally abused: Not on file    Physically abused: Not on file    Forced sexual activity: Not on file  Other Topics Concern  . Not on file  Social History Narrative   Living and working in [place], two younger daughters still at home   Older son , from previous marriage , not much contact   Licensed Dance movement psychotherapist, EMS, IT trainer    Past Surgical History:  Procedure Laterality Date  . LAPAROSCOPIC CHOLECYSTECTOMY  ~ 1999  . LUMBAR LAMINECTOMY/DECOMPRESSION MICRODISCECTOMY N/A 06/26/2013   Procedure: LUMBAR LAMINECTOMY/DECOMPRESSION MICRODISCECTOMY;  Surgeon: Sinclair Ship, MD;  Location: [facility];  Service: Orthopedics;  Laterality: N/A;  . TONSILLECTOMY  1975    Family History  Problem Relation Age of Onset  . Breast cancer Mother   . Pancreatic cancer Paternal Grandmother     PMHx, SurgHx, SocialHx, FamHx, Medications, and Allergies were reviewed in the Visit Navigator and updated as appropriate.   Patient Active Problem List   Diagnosis Date Noted  . 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 Lipoma of anterior chest wall 03/11/2016  . Elevated hemoglobin ([place]) 10/25/2015  . Hypertriglyceridemia 10/25/2015  . History of esophageal stricture 10/07/2015  . IBS (irritable bowel syndrome) 10/07/2015  . Dyslipidemia 10/07/2015  . Insomnia 10/07/2015  . Radiculopathy 06/26/2013  . History of back surgery 06/21/2013  . HTN (hypertension) 06/21/2013  . GERD (gastroesophageal reflux disease) 06/02/2008  . Perennial allergic rhinitis with seasonal variation 05/08/2008    Social History   Tobacco Use  . Smoking status: Never Smoker  . Smokeless tobacco: Former Systems developer    Types: Chew  . Tobacco comment: "quit chewing in the 1980's"  Substance Use Topics  . Alcohol use: Yes    Alcohol/week: 1.2 oz    Types: 2 Shots of liquor per week  . Drug use: No    Current Medications and Allergies:    Current Outpatient Medications:  .  amitriptyline (ELAVIL) 50 MG tablet, Take 1 tablet (50 mg total) by mouth at bedtime., Disp: 90 tablet, Rfl: 1 .  omeprazole (PRILOSEC) 20 MG capsule, Take 1 capsule (20 mg total) by mouth daily., Disp: 30 capsule, Rfl: 3 .  amoxicillin-clavulanate (AUGMENTIN) 875-125 MG tablet, Take 1 tablet by mouth 2 (two) times daily., Disp: 20 tablet, Rfl: 0 .   loratadine (CLARITIN) 10 MG tablet, Take 10 mg by mouth daily., Disp: , Rfl:  .  losartan (COZAAR) 100 MG tablet, Take 1 tablet (100 mg total) by mouth daily., Disp: 30 tablet, Rfl: 1 .  predniSONE (DELTASONE) 20 MG tablet, Take 1 tablet (20 mg total) by mouth daily., Disp: 5 tablet, Rfl: 0   Allergies  Allergen Reactions  . Morphine And Related Rash    Review of Systems   Review of Systems  Constitutional: Negative for chills and fever.  HENT: Positive for postnasal drip. Negative for sore throat.   Respiratory: Positive for cough. Negative for shortness of breath and wheezing.   Neurological: Positive for headaches.    Vitals:   Vitals:   06/15/17 0900  BP: 130/90  Pulse: 73  Temp: 98.6 F (37  C)  TempSrc: Oral  SpO2: 96%  Weight: 254 lb (115.2 kg)  Height: 6' (1.829 m)     Body mass index is 34.45 kg/m.   Physical Exam:    Physical Exam  Constitutional: He appears well-developed. He is cooperative.  Non-toxic appearance. He does not have a sickly appearance. He does not appear ill. No distress.  HENT:  Head: Normocephalic and atraumatic.  Right Ear: Tympanic membrane, external ear and ear canal normal. Tympanic membrane is not erythematous, not retracted and not bulging.  Left Ear: Tympanic membrane, external ear and ear canal normal. Tympanic membrane is not erythematous, not retracted and not bulging.  Nose: Mucosal edema and rhinorrhea present. Right sinus exhibits no maxillary sinus tenderness and no frontal sinus tenderness. Left sinus exhibits no maxillary sinus tenderness and no frontal sinus tenderness.  Mouth/Throat: Uvula is midline and mucous membranes are normal. Posterior oropharyngeal erythema present. No posterior oropharyngeal edema. Tonsils are 1+ on the right. Tonsils are 1+ on the left. No tonsillar exudate.  Eyes: Conjunctivae and lids are normal.  Neck: Trachea normal.  Cardiovascular: Normal rate, regular rhythm, S1 normal, S2 normal and normal  heart sounds.  LE edema  Pulmonary/Chest: Effort normal and breath sounds normal. He has no decreased breath sounds. He has no wheezes. He has no rhonchi. He has no rales.  Lymphadenopathy:    He has no cervical adenopathy.  Neurological: He is alert.  Skin: Skin is warm, dry and intact.  Psychiatric: He has a normal mood and affect. His speech is normal and behavior is normal.  Nursing note and vitals reviewed.    Assessment and Plan:    Amdrew was seen today for hypertension and cough.  Diagnoses and all orders for this visit:  Elevated blood sugar Discussed need for weight loss. Check HgbA1c today. Physical scheduled for June. Further intervention based on HgbA1c result. -     Hemoglobin A1c  Upper respiratory tract infection, unspecified type No red flags on exam.  Will initiate Augmentin per orders. I also provided him with a prednisone prescription of 20 mg daily x 5 days -- advised for him to take his new BP medication for a few days prior to this if possible. Discussed taking medications as prescribed. Reviewed return precautions including worsening fever, SOB, worsening cough or other concerns. Push fluids and rest. I recommend that patient follow-up if symptoms worsen or persist despite treatment x 7-10 days, sooner if needed.  Essential hypertension Uncontrolled. Discussed need for weight loss. Re-checking a BMP today. Increase Losartan to 100 mg daily. Follow-up in 1 month, sooner if symptoms or other concerns develop. Continue to check blood pressure. -     Basic metabolic panel  Other orders -     losartan (COZAAR) 100 MG tablet; Take 1 tablet (100 mg total) by mouth daily. -     amoxicillin-clavulanate (AUGMENTIN) 875-125 MG tablet; Take 1 tablet by mouth 2 (two) times daily. -     predniSONE (DELTASONE) 20 MG tablet; Take 1 tablet (20 mg total) by mouth daily.   . Reviewed expectations re: course of current medical issues. . Discussed self-management of  symptoms. . Outlined signs and symptoms indicating need for more acute intervention. . Patient verbalized understanding and all questions were answered. . See orders for this visit as documented in the electronic medical record. . Patient received an After Visit Summary.  CMA or LPN served as scribe during this visit. History, Physical, and Plan performed by medical provider. Documentation  and orders reviewed and attested to.  Inda Coke, PA-C [place] [place] 06/15/2017  Follow-up: No follow-ups on file.

## 2017-06-16 ENCOUNTER — Telehealth: Payer: Self-pay | Admitting: Physician Assistant

## 2017-06-16 NOTE — Telephone Encounter (Unsigned)
Copied from [place] [id number]. Topic: Quick Communication - Lab Results >> Jun 16, 2017  2:16 PM Marian Sorrow, LPN wrote: Called patient to inform them of 06/15/2017 lab results. When patient returns call, triage nurse may disclose results. Also pt needs lab appt only in 2 weeks please schedule. >> Jun 16, 2017  5:06 PM Percell Belt A wrote: Pt returned call

## 2017-06-19 NOTE — Telephone Encounter (Signed)
Results given and documented in result note. 

## 2017-06-27 ENCOUNTER — Telehealth: Payer: Self-pay

## 2017-06-27 DIAGNOSIS — I10 Essential (primary) hypertension: Secondary | ICD-10-CM

## 2017-06-27 NOTE — Telephone Encounter (Signed)
Pt coming for labs 06/29/17. Please place future order. Thank you.

## 2017-06-27 NOTE — Telephone Encounter (Signed)
Orders for BMP placed.

## 2017-06-29 ENCOUNTER — Other Ambulatory Visit (INDEPENDENT_AMBULATORY_CARE_PROVIDER_SITE_OTHER): Payer: BLUE CROSS/BLUE SHIELD

## 2017-06-29 DIAGNOSIS — I10 Essential (primary) hypertension: Secondary | ICD-10-CM | POA: Diagnosis not present

## 2017-06-29 LAB — BASIC METABOLIC PANEL
BUN: 12 mg/dL (ref 6–23)
CALCIUM: 9.3 mg/dL (ref 8.4–10.5)
CHLORIDE: 102 meq/L (ref 96–112)
CO2: 29 meq/L (ref 19–32)
CREATININE: 1.27 mg/dL (ref 0.40–1.50)
GFR: 61.59 mL/min (ref >60.00)
GLUCOSE: 96 mg/dL (ref 70–99)
Potassium: 4.1 mEq/L (ref 3.5–5.1)
Sodium: 138 mEq/L (ref 135–145)

## 2017-07-21 ENCOUNTER — Ambulatory Visit: Payer: Self-pay | Admitting: Physician Assistant

## 2017-08-09 NOTE — Progress Notes (Deleted)
I acted as a Education administrator for [employer], PA-C Anselmo Pickler, LPN  Subjective:    Anthony Miller is a 62 y.o. male and is here for a comprehensive physical exam.   HPI  There are no preventive care reminders to display for this patient.  Acute Concerns:   Chronic Issues:   Health Maintenance: Immunizations -- *** Colonoscopy -- *** PSA -- *** Diet -- *** Caffeine intake -- *** Sleep habits -- *** Exercise -- *** Weight --    Recent weight history Wt Readings from Last 10 Encounters:  06/15/17 254 lb (115.2 kg)  05/12/17 259 lb 6.1 oz (117.7 kg)  12/15/16 252 lb (114.3 kg)  10/27/16 250 lb 6.1 oz (113.6 kg)  10/04/16 256 lb (116.1 kg)  04/22/16 258 lb (117 kg)  03/11/16 259 lb 11.2 oz (117.8 kg)  02/24/16 259 lb (117.5 kg)  02/17/16 259 lb (117.5 kg)  02/04/16 257 lb 3.2 oz (116.7 kg)   Mood -- *** Alcohol use -- *** Tobacco use -- ***  Depression screen PHQ 2/9 10/04/2016  Decreased Interest 0  Down, Depressed, Hopeless 0  PHQ - 2 Score 0    Other providers/specialists: ***   Prostate Symptoms Questionnaire: 1. Have you had the sensation of not emptying your bladder completely after you finished urinating? {yes no:314532::"no"} 2. Have you had to urinate again less than two hours after you finished urinating? {yes no:314532::"no"} 3. Have you found you stopped and started again several times when you urinated? {yes no:314532::"no"} 4. Have you found it difficult to postpone urination? {yes no:314532::"no"} 5. Have you had a weak urinary stream? {yes no:314532::"no"} 6. Have you had to push or strain to begin urination? {yes no:314532::"no"} 7. How many times did you most typically get up to urinate from the time you went to bed at night until the time you got up in the morning? ***  PMHx, SurgHx, SocialHx, Medications, and Allergies were reviewed in the Visit Navigator and updated as appropriate.   Past Medical History:  Diagnosis Date  .  Arthritis    "lower back/pelvic region" (06/21/2013)  . GERD (gastroesophageal reflux disease)   . Hiatal hernia   . Hypertension   . IBS (irritable bowel syndrome)   . Pneumonia 1970's; ~ 2010  . Status post dilation of esophageal narrowing     Past Surgical History:  Procedure Laterality Date  . LAPAROSCOPIC CHOLECYSTECTOMY  ~ 1999  . LUMBAR LAMINECTOMY/DECOMPRESSION MICRODISCECTOMY N/A 06/26/2013   Procedure: LUMBAR LAMINECTOMY/DECOMPRESSION MICRODISCECTOMY;  Surgeon: Sinclair Ship, MD;  Location: [facility];  Service: Orthopedics;  Laterality: N/A;  . TONSILLECTOMY  1975    Family History  Problem Relation Age of Onset  . Breast cancer Mother   . Pancreatic cancer Paternal Grandmother     Social History   Tobacco Use  . Smoking status: Never Smoker  . Smokeless tobacco: Former Systems developer    Types: Chew  . Tobacco comment: "quit chewing in the 1980's"  Substance Use Topics  . Alcohol use: Yes    Alcohol/week: 1.2 oz    Types: 2 Shots of liquor per week  . Drug use: No    Review of Systems:   ROS  Objective:   There were no vitals filed for this visit. There is no height or weight on file to calculate BMI.  General  Alert, cooperative, no distress, appears stated age  Head:  Normocephalic, without obvious abnormality, atraumatic  Eyes:  PERRL, conjunctiva/corneas clear, EOM's intact, fundi benign,  both eyes       Ears:  Normal TM's and external ear canals, both ears  Nose: Nares normal, septum midline, mucosa normal, no drainage or sinus tenderness  Throat: Lips, mucosa, and tongue normal; teeth and gums normal  Neck: Supple, symmetrical, trachea midline, no adenopathy;     thyroid:  No enlargement/tenderness/nodules; no carotid bruit or JVD  Back:   Symmetric, no curvature, ROM normal, no CVA tenderness  Lungs:   Clear to auscultation bilaterally, respirations unlabored  Chest wall:  No tenderness or deformity  Heart:  Regular rate and rhythm, S1 and S2 normal,  no murmur, rub or gallop  Abdomen:   Soft, non-tender, bowel sounds active all four quadrants, no masses, no organomegaly  Extremities: Extremities normal, atraumatic, no cyanosis or edema  Prostate : ***   Skin: Skin color, texture, turgor normal, no rashes or lesions  Lymph nodes: Cervical, supraclavicular, and axillary nodes normal  Neurologic: CNII-XII grossly intact. Normal strength, sensation and reflexes throughout    AssessmentPlan:   There are no diagnoses linked to this encounter.   Well Adult Exam: Labs ordered: {Yes/No:18319::"Yes"}. Patient counseling was done. See below for items discussed. Discussed the patient's BMI.  The BMI {BMI plan (MU NQF measure 421):19504} Follow up {follow-up interval:13814}. Colon Cancer: ***  Patient Counseling: [x]   Nutrition: Stressed importance of moderation in sodium/caffeine intake, saturated fat and cholesterol, caloric balance, sufficient intake of fresh fruits, vegetables, and fiber  [x]   Stressed the importance of regular exercise.   []   Substance Abuse: Discussed cessation/primary prevention of tobacco, alcohol, or other drug use; driving or other dangerous activities under the influence; availability of treatment for abuse.   [x]   Injury prevention: Discussed safety belts, safety helmets, smoke detector, smoking near bedding or upholstery.   []   Sexuality: Discussed sexually transmitted diseases, partner selection, use of condoms, avoidance of unintended pregnancy  and contraceptive alternatives.   [x]   Dental health: Discussed importance of regular tooth brushing, flossing, and dental visits.  [x]   Health maintenance and immunizations reviewed. Please refer to Health maintenance section.   ***  Inda Coke, PA-C [place]

## 2017-08-10 ENCOUNTER — Encounter: Payer: Self-pay | Admitting: Physician Assistant

## 2017-08-10 ENCOUNTER — Encounter: Payer: BLUE CROSS/BLUE SHIELD | Admitting: Physician Assistant

## 2017-08-10 DIAGNOSIS — Z0289 Encounter for other administrative examinations: Secondary | ICD-10-CM

## 2017-08-12 ENCOUNTER — Other Ambulatory Visit: Payer: Self-pay | Admitting: Physician Assistant

## 2017-10-18 ENCOUNTER — Encounter: Payer: Self-pay | Admitting: Physician Assistant

## 2017-10-18 ENCOUNTER — Ambulatory Visit (INDEPENDENT_AMBULATORY_CARE_PROVIDER_SITE_OTHER): Payer: BLUE CROSS/BLUE SHIELD | Admitting: Physician Assistant

## 2017-10-18 VITALS — BP 150/90 | HR 91 | Temp 98.1°F | Ht 72.0 in | Wt 258.4 lb

## 2017-10-18 DIAGNOSIS — R0683 Snoring: Secondary | ICD-10-CM

## 2017-10-18 DIAGNOSIS — R400 Somnolence: Secondary | ICD-10-CM

## 2017-10-18 DIAGNOSIS — R51 Headache: Secondary | ICD-10-CM

## 2017-10-18 DIAGNOSIS — I10 Essential (primary) hypertension: Secondary | ICD-10-CM

## 2017-10-18 DIAGNOSIS — R519 Headache, unspecified: Secondary | ICD-10-CM

## 2017-10-18 LAB — CBC WITH DIFFERENTIAL/PLATELET
BASOS ABS: 0.1 10*3/uL (ref 0.0–0.1)
Basophils Relative: 0.9 % (ref 0.0–3.0)
EOS PCT: 4.7 % (ref 0.0–5.0)
Eosinophils Absolute: 0.3 10*3/uL (ref 0.0–0.7)
HCT: 48.5 % (ref 39.0–52.0)
Hemoglobin: 16.5 g/dL (ref 13.0–17.0)
LYMPHS ABS: 2.2 10*3/uL (ref 0.7–4.0)
LYMPHS PCT: 34.1 % (ref 12.0–46.0)
MCHC: 33.9 g/dL (ref 30.0–36.0)
MCV: 91.1 fl (ref 78.0–100.0)
MONO ABS: 0.6 10*3/uL (ref 0.1–1.0)
MONOS PCT: 9 % (ref 3.0–12.0)
NEUTROS PCT: 51.3 % (ref 43.0–77.0)
Neutro Abs: 3.3 10*3/uL (ref 1.4–7.7)
Platelets: 299 10*3/uL (ref 150.0–400.0)
RBC: 5.33 Mil/uL (ref 4.22–5.81)
RDW: 13.2 % (ref 11.5–15.5)
WBC: 6.5 10*3/uL (ref 4.0–10.5)

## 2017-10-18 LAB — URINALYSIS, ROUTINE W REFLEX MICROSCOPIC
BILIRUBIN URINE: NEGATIVE
HGB URINE DIPSTICK: NEGATIVE
Ketones, ur: NEGATIVE
LEUKOCYTES UA: NEGATIVE
NITRITE: NEGATIVE
RBC / HPF: NONE SEEN (ref 0–?)
Specific Gravity, Urine: 1.015 (ref 1.000–1.030)
Total Protein, Urine: NEGATIVE
UROBILINOGEN UA: 0.2 (ref 0.0–1.0)
Urine Glucose: NEGATIVE
WBC UA: NONE SEEN (ref 0–?)
pH: 6.5 (ref 5.0–8.0)

## 2017-10-18 LAB — BASIC METABOLIC PANEL
BUN: 14 mg/dL (ref 6–23)
CALCIUM: 9.8 mg/dL (ref 8.4–10.5)
CO2: 29 meq/L (ref 19–32)
Chloride: 100 mEq/L (ref 96–112)
Creatinine, Ser: 1.35 mg/dL (ref 0.40–1.50)
GFR: 57.34 mL/min — ABNORMAL LOW (ref >60.00)
Glucose, Bld: 80 mg/dL (ref 70–99)
Potassium: 4.4 mEq/L (ref 3.5–5.1)
SODIUM: 138 meq/L (ref 135–145)

## 2017-10-18 MED ORDER — AMLODIPINE BESYLATE 5 MG PO TABS: 5.0000 mg | ORAL_TABLET | Freq: Every day | ORAL | 1 refills | Status: DC

## 2017-10-18 NOTE — Patient Instructions (Addendum)
It was great to see you!  1. Schedule an eye appointment 2. Someone will be in touch about a sleep study, please have this done 3. Stop Losartan. Start 5 mg Norvasc tomorrow morning. Follow-up in 2 weeks. We can increase this as needed. Please record your blood pressure for [place]. 4. We will be in touch regarding your labs and urine results.  Let's follow-up in 2 weeks, sooner if you have concerns.  Take care,  Inda Coke PA-C

## 2017-10-18 NOTE — Progress Notes (Signed)
Anthony Miller is a 62 y.o. male here for a new problem.  I acted as a Education administrator for [employer], PA-C Anselmo Pickler, LPN  History of Present Illness:   Chief Complaint  Patient presents with  . Headache    Headache   This is a new problem. Episode onset: Started a week ago. The problem occurs daily. The pain is located in the frontal and occipital region. The pain does not radiate. The pain quality is not similar to prior headaches. The quality of the pain is described as dull, pulsating and sharp. The pain is at a severity of 5/10. The pain is moderate. Associated symptoms include blurred vision, dizziness, eye pain, muscle aches, nausea, sinus pressure and tinnitus. Associated symptoms comments: Pt is having these symptoms when he takes his Losartan, has headache and feels bad the whole day, Fatigue. 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 He has tried nothing (Stopped taking Losartan today and feels better off medication) for the symptoms. His past medical history is significant for hypertension.   Up until 1 week ago was feeling fine. But then one week ago, he noted his blood pressure was elevated. Sleeping okay -- wakes up with dry mouth. His snoring has gotten worse, he states that he and his wife sleep in different rooms due to his snoring. Does endorse stress with work.  Went on a cruise in July and felt fine then. Working on hydration. Has not taken any Losartan today. Has not had an eye exam in several years.  Past Medical History:  Diagnosis Date  . Arthritis    "lower back/pelvic region" (06/21/2013)  . GERD (gastroesophageal reflux disease)   . Hiatal hernia   . Hypertension   . IBS (irritable bowel syndrome)   . Pneumonia 1970's; ~ 2010  . Status post dilation of esophageal narrowing      Social History   Socioeconomic History  . Marital status: Married    Spouse name: Melissa  . Number of children: 3  . Years of education: Not on file  . Highest  education level: Not on file  Occupational History  . 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 Financial resource strain: Not on file  . Food insecurity:    Worry: Not on file    Inability: Not on file  . Transportation needs:    Medical: Not on file    Non-medical: Not on file  Tobacco Use  . Smoking status: Never Smoker  . Smokeless tobacco: Former Systems developer    Types: Chew  . Tobacco comment: "quit chewing in the 1980's"  Substance and Sexual Activity  . Alcohol use: Yes    Alcohol/week: 2.0 standard drinks    Types: 2 Shots of liquor per week  . Drug use: No  . Sexual activity: Yes  Lifestyle  . Physical activity:    Days per week: Not on file    Minutes per session: Not on file  . Stress: Not on file  Relationships  . Social connections:    Talks on phone: Not on file    Gets together: Not on file    Attends religious service: Not on file    Active member of club or organization: Not on file    Attends meetings of clubs or organizations: Not on file    Relationship status: Not on file  . Intimate partner violence:    Fear of current or ex  partner: Not on file    Emotionally abused: Not on file    Physically abused: Not on file    Forced sexual activity: Not on file  Other Topics Concern  . Not on file  Social History Narrative   Living and working in [place], two younger daughters still at home   Older son , from previous marriage , not much contact   Licensed Comptroller, EMS, IT trainer    Past Surgical History:  Procedure Laterality Date  . LAPAROSCOPIC CHOLECYSTECTOMY  ~ 1999  . LUMBAR LAMINECTOMY/DECOMPRESSION MICRODISCECTOMY N/A 06/26/2013   Procedure: LUMBAR LAMINECTOMY/DECOMPRESSION MICRODISCECTOMY;  Surgeon: Sinclair Ship, MD;  Location: [facility];  Service: Orthopedics;  Laterality: N/A;  . TONSILLECTOMY  1975    Family History  Problem Relation Age of Onset  . Breast  cancer Mother   . Pancreatic cancer Paternal Grandmother     Allergies  Allergen Reactions  . Morphine And Related Rash    Current Medications:   Current Outpatient Medications:  .  amitriptyline (ELAVIL) 50 MG tablet, Take 1 tablet (50 mg total) by mouth at bedtime., Disp: 90 tablet, Rfl: 1 .  loratadine (CLARITIN) 10 MG tablet, Take 10 mg by mouth daily., Disp: , Rfl:  .  losartan (COZAAR) 100 MG tablet, TAKE 1 TABLET BY MOUTH EVERY DAY, Disp: 30 tablet, Rfl: 1 .  omeprazole (PRILOSEC) 20 MG capsule, Take 1 capsule (20 mg total) by mouth daily., Disp: 30 capsule, Rfl: 3 .  amLODipine (NORVASC) 5 MG tablet, Take 1 tablet (5 mg total) by mouth daily., Disp: 30 tablet, Rfl: 1   Review of Systems:   Review of Systems  HENT: Positive for sinus pressure and tinnitus.   Eyes: Positive for blurred vision and pain.  Gastrointestinal: Positive for nausea.  Neurological: Positive for dizziness and headaches.    Vitals:   Vitals:   10/18/17 0919 10/18/17 0949  BP: (!) 156/90 (!) 150/90  Pulse: 91   Temp: 98.1 F (36.7 C)   TempSrc: Oral   SpO2: 97%   Weight: 258 lb 6.1 oz (117.2 kg)   Height: 6' (1.829 m)      Body mass index is 35.04 kg/m.  Physical Exam:   Physical Exam  Constitutional: He appears well-developed. He is cooperative.  Non-toxic appearance. He does not have a sickly appearance. He does not appear ill. No distress.  HENT:  Head: Normocephalic and atraumatic.  Right Ear: Tympanic membrane, external ear and ear canal normal. Tympanic membrane is not erythematous, not retracted and not bulging.  Left Ear: Tympanic membrane, external ear and ear canal normal. Tympanic membrane is not erythematous, not retracted and not bulging.  Nose: Nose normal. Right sinus exhibits no maxillary sinus tenderness and no frontal sinus tenderness. Left sinus exhibits no maxillary sinus tenderness and no frontal sinus tenderness.  Mouth/Throat: Uvula is midline and mucous membranes  are normal. Posterior oropharyngeal erythema present. No posterior oropharyngeal edema. Tonsils are 0 on the right. Tonsils are 0 on the left. No tonsillar exudate.  Eyes: Conjunctivae and lids are normal.  Neck: Trachea normal.  Cardiovascular: Normal rate, regular rhythm, S1 normal, S2 normal, normal heart sounds and normal pulses.  No LE edema  Pulmonary/Chest: Effort normal and breath sounds normal. He has no decreased breath sounds. He has no wheezes. He has no rhonchi. He has no rales.  Lymphadenopathy:    He has no cervical adenopathy.  Neurological: He is alert. He has normal strength. No cranial nerve deficit  or sensory deficit. Coordination and gait normal. GCS eye subscore is 4. GCS verbal subscore is 5. GCS motor subscore is 6.  Skin: Skin is warm, dry and intact.  Psychiatric: He has a normal mood and affect. His speech is normal and behavior is normal.  Nursing note and vitals reviewed.  EKG tracing is personally reviewed.  EKG notes NSR.  No acute changes.   Assessment and Plan:    Gurley was seen today for headache.  Diagnoses and all orders for this visit:  Essential hypertension; Acute nonintractable headache, unspecified headache type His symptoms are multifactorial including underlying sleep apnea, new stress with his job, uncontrolled blood pressure.  EKG without acute changes.  Neuro and cardiac exam benign.  Will order labs and urinalysis.  I do suspect that he has sleep apnea, will refer to sleep studies.  I also recommended that he have a eye exam due to his ongoing blurred vision and lack of seeing an eye doctor for several years.  We are going to stop his losartan, and start 5 mg Norvasc.  I did have him follow-up with [place] in 2 weeks.  I recommended that he keep a blood pressure log.  Patient is agreeable to plan. -     EKG 12-Lead -     Urinalysis, Routine w reflex microscopic -     CBC with Differential/Platelet -     Basic metabolic panel -     Ambulatory  referral to Sleep Studies  Snoring -     Ambulatory referral to Sleep Studies  Daytime somnolence -     Ambulatory referral to Sleep Studies  Other orders -     amLODipine (NORVASC) 5 MG tablet; Take 1 tablet (5 mg total) by mouth daily.    . Reviewed expectations re: course of current medical issues. . Discussed self-management of symptoms. . Outlined signs and symptoms indicating need for more acute intervention. . Patient verbalized understanding and all questions were answered. . See orders for this visit as documented in the electronic medical record. . Patient received an After-Visit Summary.  CMA or LPN served as scribe during this visit. History, Physical, and Plan performed by medical provider. The above documentation has been reviewed and is accurate and complete.   Inda Coke, PA-C

## 2017-10-23 ENCOUNTER — Other Ambulatory Visit: Payer: Self-pay | Admitting: Physician Assistant

## 2017-10-25 ENCOUNTER — Ambulatory Visit (INDEPENDENT_AMBULATORY_CARE_PROVIDER_SITE_OTHER): Payer: BLUE CROSS/BLUE SHIELD | Admitting: Neurology

## 2017-10-25 ENCOUNTER — Encounter: Payer: Self-pay | Admitting: Neurology

## 2017-10-25 VITALS — BP 124/94 | HR 89 | Ht 72.0 in | Wt 257.0 lb

## 2017-10-25 DIAGNOSIS — G4733 Obstructive sleep apnea (adult) (pediatric): Secondary | ICD-10-CM | POA: Insufficient documentation

## 2017-10-25 DIAGNOSIS — R0681 Apnea, not elsewhere classified: Secondary | ICD-10-CM | POA: Diagnosis not present

## 2017-10-25 DIAGNOSIS — R0683 Snoring: Secondary | ICD-10-CM

## 2017-10-25 DIAGNOSIS — J0141 Acute recurrent pansinusitis: Secondary | ICD-10-CM

## 2017-10-25 MED ORDER — MOMETASONE FUROATE 50 MCG/ACT NA SUSP: 2.0000 | Freq: Every day | NASAL | 12 refills | Status: DC

## 2017-10-25 NOTE — Patient Instructions (Signed)
In lab -Sleep Study  Anthony Miller ,   The sleep study consists of a recording of your brain waves (EEG). Breathing, heart rate and rhythm (ECG), oxygen level, eye movement, and leg movement.  The technician will glue or or paste several electrodes to your scalp, face, chest and legs.  You will have belts around your chest and abdomen to record breathing and a finger clasp to check blood oxygen levels.  A tube at your mouth and nose will detect airflow.  There are no needle sticks or painful procedures of any sort.  You will have your own room, and we will make every effort to attend to your comfort and privacy.  Please prepare for your study by the following steps:   Please avoid coffee, tea, soda, chocolate and other caffeine foods or beverages after 12:00 noon on the day of your sleep study.   You must arrive with clean (no oils), conditioners or make up, and please make sure that you wash your hair to ensure that your hair and scalp are clean, dry and free of any hair extensions on the day of your study.  This will help to get a good reading of study.  Please try not to nap on the day of your study.  Please bring a list of all your medications.  Bring any medications that you might need during the time you are within the laboratory, including insulin, sleeping pills, pain medication and anxiety medications.  Bring snacks, water or juice  Please bring clothes to sleep in and your normal overnight bag.  Please leave valuable at home, as we will not be responsible for any lost items.  If you have any further questions, please feel free to call our office. Thank you  Please call our office 48 in advance to cancel or reschedule to avoid a $100.00 early cancel or no show fee

## 2017-10-25 NOTE — Progress Notes (Signed)
SLEEP MEDICINE CLINIC   Provider:  Larey Seat, M D  Primary Care Physician:  Inda Coke, [place]   Referring Provider: Inda Coke, PA    Chief Complaint  Patient presents with  . New Patient (Initial Visit)    pt alone, rm 10. pt states that he has ben having some borderline HTN which has been helped by meds and weight loss.  He has a lot of sinus congestion.  Pt's wife told him he snores and witnessed apneas. He has had problems with waking up with headaches. He never had a sleep study. Patient's Mother was diagnosed with OSA    HPI:  Anthony Miller is a 62 y.o. male patient and seen here in a referral from [facility] for an evaluation of sleep apnea.   Anthony Miller is a 62 year old married, Caucasian gentleman who has gained weight ( 30 pounds)  since 2015 when he originally suffered a ruptured disc.  Even after surgical decompression he had not been able to do the same physical exercises he used to do regularly and gained weight.  Just 2 weeks ago he began a keto diet.  In the meantime with the rising body weight he has begun to snore louder, his wife now has mentioned that she witnesses apneas, his hypertension has increased, and in addition he has at least for some part of the year being a mouth breather suffering from sinusitis and allergic rhinitis.  He also has reported headaches, eye pain the feeling of pressure behind the eye, nausea, myalgia, sinus pressure and tinnitus.  He could not tolerate losartan and started Norvasc, with good control.  Chief complaint according to patient : " I snore and my wake noted apnea- and I have frequently congested sinus, nasal passages."  Sleep habits are as follows:  Dinner 5.30 - 7.30 - he drives his daughters to after school sports, running - and is in bed 10.30. Bedroom-bedroom is Cool, quiet and dark.  He shares a bedroom with his wife.  The patient prefers to sleep on either side of his body, some nights he seems not to move  at all.  He uses multiple pillows for back and body support and to elevate the head. He sleeps through- no nocturia - and wakes at 7 AM when his wife rises, and his daughters go to school.  No morning headches. Not woken by headaches, his HA arise while he works, straining his eyes on a computer. The couple sleeps in the same bed as their 70 twelve year old dogs- Yorkies.   Sleep medical history and family sleep history: mother was diagnosed with OSA, patient was in rehab form 06-2013 - 09-2013, leg pain, muscle atrophy in the right leg. He is left with numbness and dysesthesias.   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  The patient was never a tobacco smoker, does not use tobacco or other forms.  He used to chew tobacco between age 6 and 75.  He may drink vodka- tequila, not  beer or wine -socially 2 a week.  He will drink unsweetened caffeinated ice tea, he quit disorders, he uses decaffeinated coffee in the morning.   Review of Systems: Out of a complete 14 system review, the patient complains of only the following symptoms, and all other reviewed systems are negative.He sleeps through- no nocturia - and wakes at 7 AM when his  wife rises, and his daughters go to school.  No morning headches. Not woken by headaches, his HA arise while he works, straining his eyes on a computer.   Snoring. Apnea , Coughing , phlegm and GERD- Barrett's esophagus , needed stretching.   Epworth score  11/ 14  , Fatigue severity score 26/ 63 points  , depression score n/a    Social History   Socioeconomic History  . Marital status: Married    Spouse name: Melissa  . Number of children: 3  . Years of education: Not on file  . Highest education level: Not on file  Occupational History  . 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 Financial resource strain: Not on file    . Food insecurity:    Worry: Not on file    Inability: Not on file  . Transportation needs:    Medical: Not on file    Non-medical: Not on file  Tobacco Use  . Smoking status: Never Smoker  . Smokeless tobacco: Former Systems developer    Types: Chew  . Tobacco comment: "quit chewing in the 1980's"  Substance and Sexual Activity  . Alcohol use: Yes    Alcohol/week: 2.0 standard drinks    Types: 2 Shots of liquor per week  . Drug use: No  . Sexual activity: Yes  Lifestyle  . Physical activity:    Days per week: Not on file    Minutes per session: Not on file  . Stress: Not on file  Relationships  . Social connections:    Talks on phone: Not on file    Gets together: Not on file    Attends religious service: Not on file    Active member of club or organization: Not on file    Attends meetings of clubs or organizations: Not on file    Relationship status: Not on file  . Intimate partner violence:    Fear of current or ex partner: Not on file    Emotionally abused: Not on file    Physically abused: Not on file    Forced sexual activity: Not on file  Other Topics Concern  . Not on file  Social History Narrative   Living and working in [place], two younger daughters still at home   Older son , from previous marriage , not much contact   Licensed Comptroller, EMS, IT trainer    Family History  Problem Relation Age of Onset  . Breast cancer Mother   . Pancreatic cancer Paternal Grandmother     Past Medical History:  Diagnosis Date  . Arthritis    "lower back/pelvic region" (06/21/2013)  . GERD (gastroesophageal reflux disease)   . Hiatal hernia   . Hypertension   . IBS (irritable bowel syndrome)   . Pneumonia 1970's; ~ 2010  . Status post dilation of esophageal narrowing     Past Surgical History:  Procedure Laterality Date  . LAPAROSCOPIC CHOLECYSTECTOMY  ~ 1999  . LUMBAR LAMINECTOMY/DECOMPRESSION MICRODISCECTOMY N/A 06/26/2013   Procedure: LUMBAR  LAMINECTOMY/DECOMPRESSION MICRODISCECTOMY;  Surgeon: Sinclair Ship, MD;  Location: [facility];  Service: Orthopedics;  Laterality: N/A;  . TONSILLECTOMY  1975    Current Outpatient Medications  Medication Sig Dispense Refill  . amitriptyline (ELAVIL) 50 MG tablet Take 1 tablet (50 mg total) by mouth at bedtime. 90 tablet 1  . amLODipine (NORVASC) 5 MG tablet Take 1 tablet (5 mg total) by mouth daily. [place]  tablet 1  . loratadine (CLARITIN) 10 MG tablet Take 10 mg by mouth daily.    Marland Kitchen omeprazole (PRILOSEC) 20 MG capsule Take 1 capsule (20 mg total) by mouth daily. 30 capsule 3   No current facility-administered medications for this visit.     Allergies as of 10/25/2017 - Review Complete 10/25/2017  Allergen Reaction Noted  . Morphine and related Rash 10/27/2016    Vitals: BP (!) 124/94   Pulse 89   Ht 6' (1.829 m)   Wt 257 lb (116.6 kg)   BMI 34.86 kg/m  Last Weight:  Wt Readings from Last 1 Encounters:  10/25/17 257 lb (116.6 kg)   ENI:DPOE mass index is 34.86 kg/m.     Last Height:   Ht Readings from Last 1 Encounters:  10/25/17 6' (1.829 m)    Physical exam:  General: The patient is awake, alert and appears not in acute distress. The patient is well groomed. Head: Normocephalic, atraumatic. Neck is supple. Mallampati 4. Red and swollen uvula.   neck circumference: 18. Nasal airflow congested .  Cardiovascular:  Regular rate and rhythm, without  murmurs or carotid bruit, and without distended neck veins. Respiratory: Lungs are clear to auscultation. Skin:  Without evidence of edema, or rash Trunk: BMI is 35. The patient's posture is erect.   Neurologic exam : The patient is awake and alert, oriented to place and time.   Memory subjective described as intact.  Attention span & concentration ability appears normal.  Speech is fluent,  with dysphonia .  Mood and affect are appropriate.  Cranial nerves: Pupils are equal and briskly reactive to light. Funduscopic exam  without  evidence of pallor or edema. Extraocular movements  in vertical and horizontal planes intact and without nystagmus. Visual fields by finger perimetry are intact. Hearing to finger rub intact.   Facial sensation intact to fine touch.  Facial motor strength is symmetric and tongue and uvula move midline. Shoulder shrug was symmetrical.   Motor exam: Normal tone, muscle bulk and symmetric strength in all extremities. Sensory: Fine touch, pinprick and vibration were tested in all extremities. Proprioception tested in the upper extremities was normal. Coordination: Rapid alternating movements in the fingers/hands was normal. Finger-to-nose maneuver  normal without evidence of ataxia, dysmetria or tremor. Gait and station: Patient walks without assistive device and is able unassisted to climb up to the exam table. Strength within normal limits.  Stance is stable and normal. Turns with 3 Steps. Romberg testing is negative. Deep tendon reflexes: in the upper and lower extremities are symmetric and intact. Normal , symmetric patella response, no foot drop. .  Babinski maneuver response is downgoing.  Assessment:  After physical and neurologic examination, review of laboratory studies,  Personal review of imaging studies, reports of other /same  Imaging studies, results of polysomnography and / or neurophysiology testing and pre-existing records as far as provided in visit., my assessment is   1) Anthony Miller has definite risk factors for the presence of obstructive sleep apnea given an increase in body mass index to about 35 BMI, a larger than average next circumference related to the weight gain but also sinus congestion, nasal rhinitis, postnasal drip and phlegm which all affect his ability to breathe through the nose and force them to mouth breathe.   This certainly will have increased snoring.  He is presently working on losing weight and so far has lost 12 pounds.  I will order a sleep study  for him daily  to screen him for apnea and should he has an apnea hypercapnia index of more than 10/h I would be leaning towards treating him with CPAP.    However to have a successful therapy by a positive airway pressure the nasal passages have to be patent for air. I will offer him a nasal spray also asked him to try Mucinex without decongestive additive and drink lots of fluids in the hope of eliminating some of this phlegm. This will make our sleep study also more reliable.  BCBS- already met deductible.  The patient was advised of the nature of the diagnosed disorder , the treatment options and the  risks for general health and wellness arising from not treating the condition. I spent more than 35  minutes of face to face time with the patient. Greater than 50% of time was spent in counseling and coordination of care. We have discussed the diagnosis and differential and I answered the patient's questions.    Plan:  Treatment plan and additional workup :  I order a sleep study in lab and HST, dependig on his insurance authorization.    Larey Seat, MD 3/66/4403, 4:74 PM  Certified in Neurology by ABPN Certified in [place] by [facility] [address], [place], [place] [address]

## 2017-11-01 NOTE — Progress Notes (Deleted)
Anthony Miller is a 62 y.o. male is here to discuss:  History of Present Illness:   No chief complaint on file.   HPI   HTN Currently taking Norvasc 5 mg. At home blood pressure readings are: ***. Patient denies chest pain, SOB, blurred vision, dizziness, unusual headaches, lower leg swelling. Patient is compliant with medication. Denies excessive caffeine intake, stimulant usage, excessive alcohol intake, or increase in salt consumption.  At his last visit with me his BUN and Creatinine was up a bit.  BMP Latest Ref Rng & Units 10/18/2017 06/29/2017 06/15/2017  Glucose 70 - 99 mg/dL 80 96 86  BUN 6 - 23 mg/dL 14 12 14   Creatinine 0.40 - 1.50 mg/dL 1.35 1.27 1.33  Sodium 135 - 145 mEq/L 138 138 139  Potassium 3.5 - 5.1 mEq/L 4.4 4.1 5.0  Chloride 96 - 112 mEq/L 100 102 103  CO2 19 - 32 mEq/L 29 29 28   Calcium 8.4 - 10.5 mg/dL 9.8 9.3 9.7     He saw neurologist for possible sleep apnea on 10/25/17, and is planning to have a sleep study performed.   There are no preventive care reminders to display for this patient.  Past Medical History:  Diagnosis Date  . Arthritis    "lower back/pelvic region" (06/21/2013)  . GERD (gastroesophageal reflux disease)   . Hiatal hernia   . Hypertension   . IBS (irritable bowel syndrome)   . Pneumonia 1970's; ~ 2010  . Status post dilation of esophageal narrowing      Social History   Socioeconomic History  . Marital status: Married    Spouse name: Melissa  . Number of children: 3  . Years of education: Not on file  . Highest education level: Not on file  Occupational History  . 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 Financial resource strain: Not on file  . Food insecurity:    Worry: Not on file    Inability: Not on file  . Transportation needs:    Medical: Not on file    Non-medical: Not on file  Tobacco Use  . Smoking status: Never Smoker  . Smokeless tobacco:  Former Systems developer    Types: Chew  . Tobacco comment: "quit chewing in the 1980's"  Substance and Sexual Activity  . Alcohol use: Yes    Alcohol/week: 2.0 standard drinks    Types: 2 Shots of liquor per week  . Drug use: No  . Sexual activity: Yes  Lifestyle  . Physical activity:    Days per week: Not on file    Minutes per session: Not on file  . Stress: Not on file  Relationships  . Social connections:    Talks on phone: Not on file    Gets together: Not on file    Attends religious service: Not on file    Active member of club or organization: Not on file    Attends meetings of clubs or organizations: Not on file    Relationship status: Not on file  . Intimate partner violence:    Fear of current or ex partner: Not on file    Emotionally abused: Not on file    Physically abused: Not on file    Forced sexual activity: Not on file  Other Topics Concern  . Not on file  Social History Narrative   Living and working in [place], two younger daughters still at home  Older son , from previous marriage , not much contact   Licensed Comptroller, EMS, IT trainer    Past Surgical History:  Procedure Laterality Date  . LAPAROSCOPIC CHOLECYSTECTOMY  ~ 1999  . LUMBAR LAMINECTOMY/DECOMPRESSION MICRODISCECTOMY N/A 06/26/2013   Procedure: LUMBAR LAMINECTOMY/DECOMPRESSION MICRODISCECTOMY;  Surgeon: Sinclair Ship, MD;  Location: [facility];  Service: Orthopedics;  Laterality: N/A;  . TONSILLECTOMY  1975    Family History  Problem Relation Age of Onset  . Breast cancer Mother   . Pancreatic cancer Paternal Grandmother     PMHx, SurgHx, SocialHx, FamHx, Medications, and Allergies were reviewed in the Visit Navigator and updated as appropriate.   Patient Active Problem List   Diagnosis Date Noted  . Obstructive sleep apnea syndrome 10/25/2017  . Snoring 10/25/2017  . Acute recurrent pansinusitis 10/25/2017  . Reflex apnea 10/25/2017  . Tietze syndrome 03/11/2016    . Lipoma of anterior chest wall 03/11/2016  . Elevated hemoglobin ([place]) 10/25/2015  . Hypertriglyceridemia 10/25/2015  . History of esophageal stricture 10/07/2015  . IBS (irritable bowel syndrome) 10/07/2015  . Dyslipidemia 10/07/2015  . Insomnia 10/07/2015  . Radiculopathy 06/26/2013  . History of back surgery 06/21/2013  . HTN (hypertension) 06/21/2013  . GERD (gastroesophageal reflux disease) 06/02/2008  . Perennial allergic rhinitis with seasonal variation 05/08/2008    Social History   Tobacco Use  . Smoking status: Never Smoker  . Smokeless tobacco: Former Systems developer    Types: Chew  . Tobacco comment: "quit chewing in the 1980's"  Substance Use Topics  . Alcohol use: Yes    Alcohol/week: 2.0 standard drinks    Types: 2 Shots of liquor per week  . Drug use: No    Current Medications and Allergies:    Current Outpatient Medications:  .  amitriptyline (ELAVIL) 50 MG tablet, Take 1 tablet (50 mg total) by mouth at bedtime., Disp: 90 tablet, Rfl: 1 .  amLODipine (NORVASC) 5 MG tablet, Take 1 tablet (5 mg total) by mouth daily., Disp: 30 tablet, Rfl: 1 .  loratadine (CLARITIN) 10 MG tablet, Take 10 mg by mouth daily., Disp: , Rfl:  .  mometasone (NASONEX) 50 MCG/ACT nasal spray, Place 2 sprays into the nose daily., Disp: 17 g, Rfl: 12 .  omeprazole (PRILOSEC) 20 MG capsule, Take 1 capsule (20 mg total) by mouth daily., Disp: 30 capsule, Rfl: 3  Allergies  Allergen Reactions  . Morphine And Related Rash    Review of Systems   ROS  Vitals:  There were no vitals filed for this visit.   There is no height or weight on file to calculate BMI.   Physical Exam:    Physical Exam   Assessment and Plan:    There are no diagnoses linked to this encounter.  . Reviewed expectations re: course of current medical issues. . Discussed self-management of symptoms. . Outlined signs and symptoms indicating need for more acute intervention. . Patient verbalized understanding  and all questions were answered. . See orders for this visit as documented in the electronic medical record. . Patient received an After Visit Summary.  ***  Inda Coke, PA-C [place] 11/01/2017  Follow-up: No follow-ups on file.

## 2017-11-02 ENCOUNTER — Ambulatory Visit: Payer: BLUE CROSS/BLUE SHIELD | Admitting: Physician Assistant

## 2017-11-02 ENCOUNTER — Ambulatory Visit (INDEPENDENT_AMBULATORY_CARE_PROVIDER_SITE_OTHER): Payer: BLUE CROSS/BLUE SHIELD | Admitting: Physician Assistant

## 2017-11-02 ENCOUNTER — Encounter: Payer: Self-pay | Admitting: Physician Assistant

## 2017-11-02 VITALS — BP 126/86 | HR 74 | Temp 97.8°F | Ht 72.0 in | Wt 252.5 lb

## 2017-11-02 DIAGNOSIS — Z23 Encounter for immunization: Secondary | ICD-10-CM

## 2017-11-02 DIAGNOSIS — I10 Essential (primary) hypertension: Secondary | ICD-10-CM

## 2017-11-02 LAB — BASIC METABOLIC PANEL
BUN: 14 mg/dL (ref 6–23)
CALCIUM: 9.5 mg/dL (ref 8.4–10.5)
CHLORIDE: 104 meq/L (ref 96–112)
CO2: 26 mEq/L (ref 19–32)
Creatinine, Ser: 1.19 mg/dL (ref 0.40–1.50)
GFR: 66.31 mL/min (ref >60.00)
Glucose, Bld: 90 mg/dL (ref 70–99)
Potassium: 4.3 mEq/L (ref 3.5–5.1)
Sodium: 138 mEq/L (ref 135–145)

## 2017-11-02 MED ORDER — AMLODIPINE BESYLATE 5 MG PO TABS: 5.0000 mg | ORAL_TABLET | Freq: Every day | ORAL | 1 refills | Status: DC

## 2017-11-02 NOTE — Progress Notes (Signed)
Anthony Miller is a 62 y.o. male is here to follow up on Hypertension.  I acted as a Education administrator for [employer], PA-C Anselmo Pickler, LPN  History of Present Illness:   Chief Complaint  Patient presents with  . Hypertension    Hypertension  This is a chronic problem. Episode onset: Pt is here to follow up on blood pressure, has not been checking blood pressure regularly, last checked was 128/82 about a week ago. The problem has been gradually improving since onset. The problem is controlled. Pertinent negatives include no blurred vision, chest pain, headaches, malaise/fatigue, palpitations, peripheral edema or shortness of breath. Risk factors for coronary artery disease include dyslipidemia, stress and male gender. The current treatment provides moderate improvement. Compliance problems include diet.    Wt Readings from Last 5 Encounters:  11/02/17 252 lb 8 oz (114.5 kg)  10/25/17 257 lb (116.6 kg)  10/18/17 258 lb 6.1 oz (117.2 kg)  06/15/17 254 lb (115.2 kg)  05/12/17 259 lb 6.1 oz (117.7 kg)   BP Readings from Last 3 Encounters:  11/02/17 126/86  10/25/17 (!) 124/94  10/18/17 (!) 150/90   Sleep study pending.  There are no preventive care reminders to display for this patient.  Past Medical History:  Diagnosis Date  . Arthritis    "lower back/pelvic region" (06/21/2013)  . GERD (gastroesophageal reflux disease)   . Hiatal hernia   . Hypertension   . IBS (irritable bowel syndrome)   . Pneumonia 1970's; ~ 2010  . Status post dilation of esophageal narrowing      Social History   Socioeconomic History  . Marital status: Married    Spouse name: Melissa  . Number of children: 3  . Years of education: Not on file  . Highest education level: Not on file  Occupational History  . 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 Financial resource strain: Not on file  . Food insecurity:    Worry: Not on file   Inability: Not on file  . Transportation needs:    Medical: Not on file    Non-medical: Not on file  Tobacco Use  . Smoking status: Never Smoker  . Smokeless tobacco: Former Systems developer    Types: Chew  . Tobacco comment: "quit chewing in the 1980's"  Substance and Sexual Activity  . Alcohol use: Yes    Alcohol/week: 2.0 standard drinks    Types: 2 Shots of liquor per week  . Drug use: No  . Sexual activity: Yes  Lifestyle  . Physical activity:    Days per week: Not on file    Minutes per session: Not on file  . Stress: Not on file  Relationships  . Social connections:    Talks on phone: Not on file    Gets together: Not on file    Attends religious service: Not on file    Active member of club or organization: Not on file    Attends meetings of clubs or organizations: Not on file    Relationship status: Not on file  . Intimate partner violence:    Fear of current or ex partner: Not on file    Emotionally abused: Not on file    Physically abused: Not on file    Forced sexual activity: Not on file  Other Topics Concern  . Not on file  Social History Narrative   Living and working in [place], two younger daughters still  at home   Older son , from previous marriage , not much contact   Licensed Comptroller, EMS, IT trainer    Past Surgical History:  Procedure Laterality Date  . LAPAROSCOPIC CHOLECYSTECTOMY  ~ 1999  . LUMBAR LAMINECTOMY/DECOMPRESSION MICRODISCECTOMY N/A 06/26/2013   Procedure: LUMBAR LAMINECTOMY/DECOMPRESSION MICRODISCECTOMY;  Surgeon: Sinclair Ship, MD;  Location: [facility];  Service: Orthopedics;  Laterality: N/A;  . TONSILLECTOMY  1975    Family History  Problem Relation Age of Onset  . Breast cancer Mother   . Pancreatic cancer Paternal Grandmother     PMHx, SurgHx, SocialHx, FamHx, Medications, and Allergies were reviewed in the Visit Navigator and updated as appropriate.   Patient Active Problem List   Diagnosis Date Noted  .  Obstructive sleep apnea syndrome 10/25/2017  . Snoring 10/25/2017  . Acute recurrent pansinusitis 10/25/2017  . Reflex apnea 10/25/2017  . 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 Lipoma of anterior chest wall 03/11/2016  . Elevated hemoglobin ([place]) 10/25/2015  . Hypertriglyceridemia 10/25/2015  . History of esophageal stricture 10/07/2015  . IBS (irritable bowel syndrome) 10/07/2015  . Dyslipidemia 10/07/2015  . Insomnia 10/07/2015  . Radiculopathy 06/26/2013  . History of back surgery 06/21/2013  . HTN (hypertension) 06/21/2013  . GERD (gastroesophageal reflux disease) 06/02/2008  . Perennial allergic rhinitis with seasonal variation 05/08/2008    Social History   Tobacco Use  . Smoking status: Never Smoker  . Smokeless tobacco: Former Systems developer    Types: Chew  . Tobacco comment: "quit chewing in the 1980's"  Substance Use Topics  . Alcohol use: Yes    Alcohol/week: 2.0 standard drinks    Types: 2 Shots of liquor per week  . Drug use: No    Current Medications and Allergies:    Current Outpatient Medications:  .  amitriptyline (ELAVIL) 50 MG tablet, Take 1 tablet (50 mg total) by mouth at bedtime., Disp: 90 tablet, Rfl: 1 .  amLODipine (NORVASC) 5 MG tablet, Take 1 tablet (5 mg total) by mouth daily., Disp: 30 tablet, Rfl: 1 .  loratadine (CLARITIN) 10 MG tablet, Take 10 mg by mouth daily., Disp: , Rfl:  .  mometasone (NASONEX) 50 MCG/ACT nasal spray, Place 2 sprays into the nose daily., Disp: 17 g, Rfl: 12 .  omeprazole (PRILOSEC) 20 MG capsule, Take 1 capsule (20 mg total) by mouth daily., Disp: 30 capsule, Rfl: 3   Allergies  Allergen Reactions  . Morphine And Related Rash    Review of Systems   Review of Systems  Constitutional: Negative for malaise/fatigue.  Eyes: Negative for blurred vision.  Respiratory: Negative for shortness of breath.   Cardiovascular: Negative for chest pain and palpitations.  Neurological: Negative for headaches.    Vitals:   Vitals:    11/02/17 0909 11/02/17 0931  BP: 130/88 126/86  Pulse: 74   Temp: 97.8 F (36.6 C)   TempSrc: Oral   SpO2: 97%   Weight: 252 lb 8 oz (114.5 kg)   Height: 6' (1.829 m)      Body mass index is 34.25 kg/m.   Physical Exam:    Physical Exam  Constitutional: He appears well-developed. He is cooperative.  Non-toxic appearance. He does not have a sickly appearance. He does not appear ill. No distress.  Cardiovascular: Normal rate, regular rhythm, S1 normal, S2 normal, normal heart sounds and normal pulses.  No LE edema  Pulmonary/Chest: Effort normal and breath sounds normal.  Neurological: He is alert. GCS eye subscore is 4. GCS verbal  subscore is 5. GCS motor subscore is 6.  Skin: Skin is warm, dry and intact.  Psychiatric: He has a normal mood and affect. His speech is normal and behavior is normal.  Nursing note and vitals reviewed.    Assessment and Plan:    Abdulraheem was seen today for hypertension.  Diagnoses and all orders for this visit:  Essential hypertension Controlled presently. Doing well on Norvasc 5mg . Continues to be very dedicated to dietary changes, is down another 5 lb! Encouraged continue hard work. Will not increase dose at this time. F/u in 3 months, sooner if needed. BMP today to recheck kidney function. -     Basic metabolic panel  Need for prophylactic vaccination and inoculation against influenza -     Flu Vaccine QUAD 36+ mos IM  Other orders -     amLODipine (NORVASC) 5 MG tablet; Take 1 tablet (5 mg total) by mouth daily.    . Reviewed expectations re: course of current medical issues. . Discussed self-management of symptoms. . Outlined signs and symptoms indicating need for more acute intervention. . Patient verbalized understanding and all questions were answered. . See orders for this visit as documented in the electronic medical record. . Patient received an After Visit Summary.  CMA or LPN served as scribe during this visit. History,  Physical, and Plan performed by medical provider. The above documentation has been reviewed and is accurate and complete.   Inda Coke, PA-C [place] 11/02/2017  Follow-up: No follow-ups on file.

## 2017-11-02 NOTE — Patient Instructions (Signed)
It was great to see you!  Continue Norvasc 5 mg.  Let's follow-up in 3 months, sooner if you have concerns.  Take care,  Inda Coke PA-C

## 2017-11-09 ENCOUNTER — Telehealth: Payer: Self-pay

## 2017-11-09 ENCOUNTER — Other Ambulatory Visit: Payer: Self-pay | Admitting: Physician Assistant

## 2017-11-09 NOTE — Telephone Encounter (Signed)
Called pt on 9/23 to schedule sleep study.  Pt stated he wasn't ready to schedule and will call us back.

## 2017-12-13 ENCOUNTER — Encounter

## 2017-12-13 ENCOUNTER — Institutional Professional Consult (permissible substitution): Payer: Self-pay | Admitting: Neurology

## 2018-01-01 ENCOUNTER — Ambulatory Visit (INDEPENDENT_AMBULATORY_CARE_PROVIDER_SITE_OTHER): Payer: BLUE CROSS/BLUE SHIELD | Admitting: Physician Assistant

## 2018-01-01 ENCOUNTER — Encounter: Payer: Self-pay | Admitting: Physician Assistant

## 2018-01-01 VITALS — BP 114/76 | HR 97 | Temp 98.8°F | Ht 72.0 in | Wt 256.2 lb

## 2018-01-01 DIAGNOSIS — R05 Cough: Secondary | ICD-10-CM

## 2018-01-01 DIAGNOSIS — R059 Cough, unspecified: Secondary | ICD-10-CM

## 2018-01-01 MED ORDER — AZITHROMYCIN 250 MG PO TABS: ORAL_TABLET | ORAL | 0 refills | Status: DC

## 2018-01-01 MED ORDER — METHYLPREDNISOLONE ACETATE 80 MG/ML IJ SUSP
80.0000 mg | Freq: Once | INTRAMUSCULAR | Status: AC
  Administered 2018-01-01: 80 mg via INTRAMUSCULAR

## 2018-01-01 NOTE — Patient Instructions (Signed)
It was great to see you!  Use medication as prescribed: Azithromycin antibiotic  Push fluids and get plenty of rest. Please return if you are not improving as expected, or if you have high fevers (>101.5) or difficulty swallowing or worsening productive cough.  Call clinic with questions.  I hope you start feeling better soon!

## 2018-01-01 NOTE — Progress Notes (Signed)
Anthony Miller is a 62 y.o. male here for a new problem.  I acted as a Education administrator for [employer], PA-C Anselmo Pickler, LPN  History of Present Illness:   Chief Complaint  Patient presents with  . Cough    Cough  This is a new problem. Episode onset: Started Wednesday. The problem has been gradually worsening. The problem occurs every few hours. The cough is productive of sputum (yellow sputum). Associated symptoms include ear congestion, nasal congestion and postnasal drip. Pertinent negatives include no chills, ear pain, fever, headaches, sore throat or shortness of breath. The symptoms are aggravated by cold air and lying down. Treatments tried: Mucinex, Flonase. The treatment provided mild relief. His past medical history is significant for bronchitis and pneumonia.   He has had multiple sick exposures. Daughter and wife with similar symptoms. Denies chest pain or SOB. Actually feels better today than he has at the beginning of his illness.   Past Medical History:  Diagnosis Date  . Arthritis    "lower back/pelvic region" (06/21/2013)  . GERD (gastroesophageal reflux disease)   . Hiatal hernia   . Hypertension   . IBS (irritable bowel syndrome)   . Pneumonia 1970's; ~ 2010  . Status post dilation of esophageal narrowing      Social History   Socioeconomic History  . Marital status: Married    Spouse name: Melissa  . Number of children: 3  . Years of education: Not on file  . Highest education level: Not on file  Occupational History  . 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 Financial resource strain: Not on file  . Food insecurity:    Worry: Not on file    Inability: Not on file  . Transportation needs:    Medical: Not on file    Non-medical: Not on file  Tobacco Use  . Smoking status: Never Smoker  . Smokeless tobacco: Former Systems developer    Types: Chew  . Tobacco comment: "quit chewing in the 1980's"   Substance and Sexual Activity  . Alcohol use: Yes    Alcohol/week: 2.0 standard drinks    Types: 2 Shots of liquor per week  . Drug use: No  . Sexual activity: Yes  Lifestyle  . Physical activity:    Days per week: Not on file    Minutes per session: Not on file  . Stress: Not on file  Relationships  . Social connections:    Talks on phone: Not on file    Gets together: Not on file    Attends religious service: Not on file    Active member of club or organization: Not on file    Attends meetings of clubs or organizations: Not on file    Relationship status: Not on file  . Intimate partner violence:    Fear of current or ex partner: Not on file    Emotionally abused: Not on file    Physically abused: Not on file    Forced sexual activity: Not on file  Other Topics Concern  . Not on file  Social History Narrative   Living and working in [place], two younger daughters still at home   Older son , from previous marriage , not much contact   Licensed Comptroller, EMS, IT trainer    Past Surgical History:  Procedure Laterality Date  . LAPAROSCOPIC CHOLECYSTECTOMY  ~ 1999  . LUMBAR LAMINECTOMY/DECOMPRESSION MICRODISCECTOMY N/A 06/26/2013  Procedure: LUMBAR LAMINECTOMY/DECOMPRESSION MICRODISCECTOMY;  Surgeon: Sinclair Ship, MD;  Location: [facility];  Service: Orthopedics;  Laterality: N/A;  . TONSILLECTOMY  1975    Family History  Problem Relation Age of Onset  . Breast cancer Mother   . Pancreatic cancer Paternal Grandmother     Allergies  Allergen Reactions  . Morphine And Related Rash    Current Medications:   Current Outpatient Medications:  .  amitriptyline (ELAVIL) 50 MG tablet, Take 1 tablet (50 mg total) by mouth at bedtime., Disp: 90 tablet, Rfl: 1 .  amLODipine (NORVASC) 5 MG tablet, Take 1 tablet (5 mg total) by mouth daily., Disp: 30 tablet, Rfl: 1 .  amLODipine (NORVASC) 5 MG tablet, TAKE 1 TABLET BY MOUTH EVERY DAY, Disp: 30  tablet, Rfl: 2 .  fluticasone (FLONASE) 50 MCG/ACT nasal spray, Place 2 sprays into both nostrils daily., Disp: , Rfl:  .  loratadine (CLARITIN) 10 MG tablet, Take 10 mg by mouth daily., Disp: , Rfl:  .  omeprazole (PRILOSEC) 20 MG capsule, Take 1 capsule (20 mg total) by mouth daily., Disp: 30 capsule, Rfl: 3 .  azithromycin (ZITHROMAX) 250 MG tablet, Take two tablets on day 1, then one tablet daily x 4 days, Disp: 6 each, Rfl: 0  Current Facility-Administered Medications:  .  methylPREDNISolone acetate (DEPO-MEDROL) injection 80 mg, 80 mg, Intramuscular, Once, Morene Rankins, [place], [place]   Review of Systems:   Review of Systems  Constitutional: Negative for chills and fever.  HENT: Positive for postnasal drip. Negative for ear pain and sore throat.   Respiratory: Positive for cough. Negative for shortness of breath.   Neurological: Negative for headaches.    Vitals:   Vitals:   01/01/18 1447  BP: 114/76  Pulse: 97  Temp: 98.8 F (37.1 C)  TempSrc: Oral  SpO2: 96%  Weight: 256 lb 4 oz (116.2 kg)  Height: 6' (1.829 m)     Body mass index is 34.75 kg/m.  Physical Exam:   Physical Exam  Constitutional: He appears well-developed. He is cooperative.  Non-toxic appearance. He does not have a sickly appearance. He does not appear ill. No distress.  HENT:  Head: Normocephalic and atraumatic.  Right Ear: Tympanic membrane, external ear and ear canal normal. Tympanic membrane is not erythematous, not retracted and not bulging.  Left Ear: Tympanic membrane, external ear and ear canal normal. Tympanic membrane is not erythematous, not retracted and not bulging.  Nose: Mucosal edema and rhinorrhea present. Right sinus exhibits no maxillary sinus tenderness and no frontal sinus tenderness. Left sinus exhibits no maxillary sinus tenderness and no frontal sinus tenderness.  Mouth/Throat: Uvula is midline and mucous membranes are normal. Posterior oropharyngeal erythema present. No posterior  oropharyngeal edema.  Eyes: Conjunctivae and lids are normal.  Neck: Trachea normal.  Cardiovascular: Normal rate, regular rhythm, S1 normal, S2 normal, normal heart sounds and normal pulses.  No LE edema  Pulmonary/Chest: Effort normal and breath sounds normal. He has no decreased breath sounds. He has no wheezes. He has no rhonchi. He has no rales.  Lymphadenopathy:    He has no cervical adenopathy.  Neurological: He is alert. GCS eye subscore is 4. GCS verbal subscore is 5. GCS motor subscore is 6.  Skin: Skin is warm, dry and intact.  Psychiatric: He has a normal mood and affect. His speech is normal and behavior is normal.  Nursing note and vitals reviewed.    Assessment and Plan:   Zebulun was seen today for cough.  Diagnoses and all orders for this visit:  Cough -     methylPREDNISolone acetate (DEPO-MEDROL) injection 80 mg  Other orders -     azithromycin (ZITHROMAX) 250 MG tablet; Take two tablets on day 1, then one tablet daily x 4 days   No red flags on exam.  Will initiate Azithromycin antibiotic per orders. He received depo-medrol injection in the office and tolerated well. Discussed taking medications as prescribed. Reviewed return precautions including worsening fever, SOB, worsening cough or other concerns. Push fluids and rest. I recommend that patient follow-up if symptoms worsen or persist despite treatment x 7-10 days, sooner if needed.  If worsening cough, will consider ordering CXR.  Marland Kitchen Reviewed expectations re: course of current medical issues. . Discussed self-management of symptoms. . Outlined signs and symptoms indicating need for more acute intervention. . Patient verbalized understanding and all questions were answered. . See orders for this visit as documented in the electronic medical record. . Patient received an After-Visit Summary.  CMA or LPN served as scribe during this visit. History, Physical, and Plan performed by medical provider. The above  documentation has been reviewed and is accurate and complete.  Inda Coke, PA-C

## 2018-02-01 ENCOUNTER — Ambulatory Visit: Payer: BLUE CROSS/BLUE SHIELD | Admitting: Physician Assistant

## 2018-02-01 NOTE — Progress Notes (Deleted)
Anthony Miller is a 62 y.o. male is here to discuss: Hypertension  I acted as a Education administrator for [employer], PA-C Anselmo Pickler, LPN  History of Present Illness:   No chief complaint on file.   Hypertension  This is a chronic problem. The current episode started more than 1 year ago. Risk factors for coronary artery disease include dyslipidemia.    There are no preventive care reminders to display for this patient.  Past Medical History:  Diagnosis Date  . Arthritis    "lower back/pelvic region" (06/21/2013)  . GERD (gastroesophageal reflux disease)   . Hiatal hernia   . Hypertension   . IBS (irritable bowel syndrome)   . Pneumonia 1970's; ~ 2010  . Status post dilation of esophageal narrowing      Social History   Socioeconomic History  . Marital status: Married    Spouse name: Melissa  . Number of children: 3  . Years of education: Not on file  . Highest education level: Not on file  Occupational History  . 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 Financial resource strain: Not on file  . Food insecurity:    Worry: Not on file    Inability: Not on file  . Transportation needs:    Medical: Not on file    Non-medical: Not on file  Tobacco Use  . Smoking status: Never Smoker  . Smokeless tobacco: Former Systems developer    Types: Chew  . Tobacco comment: "quit chewing in the 1980's"  Substance and Sexual Activity  . Alcohol use: Yes    Alcohol/week: 2.0 standard drinks    Types: 2 Shots of liquor per week  . Drug use: No  . Sexual activity: Yes  Lifestyle  . Physical activity:    Days per week: Not on file    Minutes per session: Not on file  . Stress: Not on file  Relationships  . Social connections:    Talks on phone: Not on file    Gets together: Not on file    Attends religious service: Not on file    Active member of club or organization: Not on file    Attends meetings of clubs or  organizations: Not on file    Relationship status: Not on file  . Intimate partner violence:    Fear of current or ex partner: Not on file    Emotionally abused: Not on file    Physically abused: Not on file    Forced sexual activity: Not on file  Other Topics Concern  . Not on file  Social History Narrative   Living and working in [place], two younger daughters still at home   Older son , from previous marriage , not much contact   Licensed Comptroller, EMS, IT trainer    Past Surgical History:  Procedure Laterality Date  . LAPAROSCOPIC CHOLECYSTECTOMY  ~ 1999  . LUMBAR LAMINECTOMY/DECOMPRESSION MICRODISCECTOMY N/A 06/26/2013   Procedure: LUMBAR LAMINECTOMY/DECOMPRESSION MICRODISCECTOMY;  Surgeon: Sinclair Ship, MD;  Location: [facility];  Service: Orthopedics;  Laterality: N/A;  . TONSILLECTOMY  1975    Family History  Problem Relation Age of Onset  . Breast cancer Mother   . Pancreatic cancer Paternal Grandmother     PMHx, SurgHx, SocialHx, FamHx, Medications, and Allergies were reviewed in the Visit Navigator and updated as appropriate.   Patient Active Problem List   Diagnosis Date Noted  . Obstructive sleep  apnea syndrome 10/25/2017  . Snoring 10/25/2017  . Acute recurrent pansinusitis 10/25/2017  . Reflex apnea 10/25/2017  . 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 Lipoma of anterior chest wall 03/11/2016  . Elevated hemoglobin ([place]) 10/25/2015  . Hypertriglyceridemia 10/25/2015  . History of esophageal stricture 10/07/2015  . IBS (irritable bowel syndrome) 10/07/2015  . Dyslipidemia 10/07/2015  . Insomnia 10/07/2015  . Radiculopathy 06/26/2013  . History of back surgery 06/21/2013  . HTN (hypertension) 06/21/2013  . GERD (gastroesophageal reflux disease) 06/02/2008  . Perennial allergic rhinitis with seasonal variation 05/08/2008    Social History   Tobacco Use  . Smoking status: Never Smoker  . Smokeless tobacco: Former Systems developer    Types: Chew    . Tobacco comment: "quit chewing in the 1980's"  Substance Use Topics  . Alcohol use: Yes    Alcohol/week: 2.0 standard drinks    Types: 2 Shots of liquor per week  . Drug use: No    Current Medications and Allergies:    Current Outpatient Medications:  .  amitriptyline (ELAVIL) 50 MG tablet, Take 1 tablet (50 mg total) by mouth at bedtime., Disp: 90 tablet, Rfl: 1 .  amLODipine (NORVASC) 5 MG tablet, Take 1 tablet (5 mg total) by mouth daily., Disp: 30 tablet, Rfl: 1 .  amLODipine (NORVASC) 5 MG tablet, TAKE 1 TABLET BY MOUTH EVERY DAY, Disp: 30 tablet, Rfl: 2 .  azithromycin (ZITHROMAX) 250 MG tablet, Take two tablets on day 1, then one tablet daily x 4 days, Disp: 6 each, Rfl: 0 .  fluticasone (FLONASE) 50 MCG/ACT nasal spray, Place 2 sprays into both nostrils daily., Disp: , Rfl:  .  loratadine (CLARITIN) 10 MG tablet, Take 10 mg by mouth daily., Disp: , Rfl:  .  omeprazole (PRILOSEC) 20 MG capsule, Take 1 capsule (20 mg total) by mouth daily., Disp: 30 capsule, Rfl: 3  Allergies  Allergen Reactions  . Morphine And Related Rash    Review of Systems   ROS  Vitals:  There were no vitals filed for this visit.   There is no height or weight on file to calculate BMI.   Physical Exam:    Physical Exam   Assessment and Plan:    There are no diagnoses linked to this encounter.  . Reviewed expectations re: course of current medical issues. . Discussed self-management of symptoms. . Outlined signs and symptoms indicating need for more acute intervention. . Patient verbalized understanding and all questions were answered. . See orders for this visit as documented in the electronic medical record. . Patient received an After Visit Summary.  ***  Inda Coke, PA-C [place], [place] 02/01/2018  Follow-up: No follow-ups on file.

## 2018-02-19 ENCOUNTER — Ambulatory Visit (INDEPENDENT_AMBULATORY_CARE_PROVIDER_SITE_OTHER): Payer: BLUE CROSS/BLUE SHIELD | Admitting: Physician Assistant

## 2018-02-19 ENCOUNTER — Encounter: Payer: Self-pay | Admitting: Physician Assistant

## 2018-02-19 VITALS — BP 136/88 | HR 93 | Temp 98.2°F | Ht 72.0 in | Wt 256.0 lb

## 2018-02-19 DIAGNOSIS — R05 Cough: Secondary | ICD-10-CM

## 2018-02-19 DIAGNOSIS — R059 Cough, unspecified: Secondary | ICD-10-CM

## 2018-02-19 MED ORDER — AZITHROMYCIN 250 MG PO TABS: ORAL_TABLET | ORAL | 0 refills | Status: DC

## 2018-02-19 MED ORDER — HYDROCOD POLST-CPM POLST ER 10-8 MG/5ML PO SUER: 5.0000 mL | Freq: Every evening | ORAL | 0 refills | Status: DC | PRN

## 2018-02-19 MED ORDER — METHYLPREDNISOLONE ACETATE 80 MG/ML IJ SUSP
80.0000 mg | Freq: Once | INTRAMUSCULAR | Status: AC
  Administered 2018-02-19: 80 mg via INTRAMUSCULAR

## 2018-02-19 NOTE — Progress Notes (Signed)
Anthony Miller is a 64 y.o. male here for a new problem.  I acted as a Education administrator for [employer], PA-C Anselmo Pickler, LPN  History of Present Illness:   Chief Complaint  Patient presents with  . Cough    Cough  This is a new problem. Episode onset: Started 2 weeks ago. The problem has been gradually worsening. The problem occurs constantly. The cough is productive of sputum (expectorating clear/light yellow). Associated symptoms include headaches, nasal congestion and postnasal drip. Pertinent negatives include no chills, ear congestion, ear pain, fever, sore throat, shortness of breath or wheezing. The symptoms are aggravated by lying down. Treatments tried: Mucinex. The treatment provided mild relief. His past medical history is significant for bronchitis and pneumonia.    Past Medical History:  Diagnosis Date  . Arthritis    "lower back/pelvic region" (06/21/2013)  . GERD (gastroesophageal reflux disease)   . Hiatal hernia   . Hypertension   . IBS (irritable bowel syndrome)   . Pneumonia 1970's; ~ 2010  . Status post dilation of esophageal narrowing      Social History   Socioeconomic History  . Marital status: Married    Spouse name: Melissa  . Number of children: 3  . Years of education: Not on file  . Highest education level: Not on file  Occupational History  . 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 Financial resource strain: Not on file  . Food insecurity:    Worry: Not on file    Inability: Not on file  . Transportation needs:    Medical: Not on file    Non-medical: Not on file  Tobacco Use  . Smoking status: Never Smoker  . Smokeless tobacco: Former Systems developer    Types: Chew  . Tobacco comment: "quit chewing in the 1980's"  Substance and Sexual Activity  . Alcohol use: Yes    Alcohol/week: 2.0 standard drinks    Types: 2 Shots of liquor per week  . Drug use: No  . Sexual activity: Yes   Lifestyle  . Physical activity:    Days per week: Not on file    Minutes per session: Not on file  . Stress: Not on file  Relationships  . Social connections:    Talks on phone: Not on file    Gets together: Not on file    Attends religious service: Not on file    Active member of club or organization: Not on file    Attends meetings of clubs or organizations: Not on file    Relationship status: Not on file  . Intimate partner violence:    Fear of current or ex partner: Not on file    Emotionally abused: Not on file    Physically abused: Not on file    Forced sexual activity: Not on file  Other Topics Concern  . Not on file  Social History Narrative   Living and working in [place], two younger daughters still at home   Older son , from previous marriage , not much contact   Licensed Comptroller, EMS, IT trainer    Past Surgical History:  Procedure Laterality Date  . LAPAROSCOPIC CHOLECYSTECTOMY  ~ 1999  . LUMBAR LAMINECTOMY/DECOMPRESSION MICRODISCECTOMY N/A 06/26/2013   Procedure: LUMBAR LAMINECTOMY/DECOMPRESSION MICRODISCECTOMY;  Surgeon: Sinclair Ship, MD;  Location: [facility];  Service: Orthopedics;  Laterality: N/A;  . TONSILLECTOMY  1975    Family History  Problem Relation  Age of Onset  . Breast cancer Mother   . Pancreatic cancer Paternal Grandmother     Allergies  Allergen Reactions  . Morphine And Related Rash    Current Medications:   Current Outpatient Medications:  .  amitriptyline (ELAVIL) 50 MG tablet, Take 1 tablet (50 mg total) by mouth at bedtime., Disp: 90 tablet, Rfl: 1 .  amLODipine (NORVASC) 5 MG tablet, TAKE 1 TABLET BY MOUTH EVERY DAY, Disp: 30 tablet, Rfl: 2 .  fluticasone (FLONASE) 50 MCG/ACT nasal spray, Place 2 sprays into both nostrils daily., Disp: , Rfl:  .  loratadine (CLARITIN) 10 MG tablet, Take 10 mg by mouth daily., Disp: , Rfl:  .  omeprazole (PRILOSEC) 20 MG capsule, Take 1 capsule (20 mg total) by mouth  daily., Disp: 30 capsule, Rfl: 3 .  azithromycin (ZITHROMAX) 250 MG tablet, Take two tablets on day 1, then one tablet daily x 4 days, Disp: 6 tablet, Rfl: 0 .  chlorpheniramine-HYDROcodone (TUSSIONEX PENNKINETIC ER) 10-8 MG/5ML SUER, Take 5 mLs by mouth at bedtime as needed for cough., Disp: 140 mL, Rfl: 0   Review of Systems:   Review of Systems  Constitutional: Negative for chills and fever.  HENT: Positive for postnasal drip. Negative for ear pain and sore throat.   Respiratory: Positive for cough. Negative for shortness of breath and wheezing.   Neurological: Positive for headaches.    Vitals:   Vitals:   02/19/18 0744  BP: 136/88  Pulse: 93  Temp: 98.2 F (36.8 C)  TempSrc: Oral  SpO2: 98%  Weight: 256 lb (116.1 kg)  Height: 6' (1.829 m)     Body mass index is 34.72 kg/m.  Physical Exam:   Physical Exam Vitals signs and nursing note reviewed.  Constitutional:      General: He is not in acute distress.    Appearance: He is well-developed. He is not ill-appearing or toxic-appearing.  HENT:     Head: Normocephalic and atraumatic.     Right Ear: Tympanic membrane, ear canal and external ear normal. Tympanic membrane is not erythematous, retracted or bulging.     Left Ear: Tympanic membrane, ear canal and external ear normal. Tympanic membrane is not erythematous, retracted or bulging.     Nose: Nose normal.     Right Sinus: No maxillary sinus tenderness or frontal sinus tenderness.     Left Sinus: No maxillary sinus tenderness or frontal sinus tenderness.     Mouth/Throat:     Lips: Pink.     Mouth: Mucous membranes are moist.     Pharynx: Uvula midline. Posterior oropharyngeal erythema present.     Tonsils: Swelling: 0 on the right. 0 on the left.  Eyes:     General: Lids are normal.     Conjunctiva/sclera: Conjunctivae normal.  Neck:     Trachea: Trachea normal.  Cardiovascular:     Rate and Rhythm: Normal rate and regular rhythm.     Heart sounds: Normal  heart sounds, S1 normal and S2 normal.  Pulmonary:     Effort: Pulmonary effort is normal.     Breath sounds: Normal breath sounds. No decreased breath sounds, wheezing, rhonchi or rales.  Lymphadenopathy:     Cervical: No cervical adenopathy.  Skin:    General: Skin is warm and dry.  Neurological:     Mental Status: He is alert.  Psychiatric:        Speech: Speech normal.        Behavior: Behavior normal. Behavior  is cooperative.     Assessment and Plan:   Dorse was seen today for cough.  Diagnoses and all orders for this visit:  Cough No red flags on exam. Suspect bronchitis. Will initiate Azithromycin and Tussionex per orders. Received depo-medrol injection in office and tolerated well. May also use plain Mucinex. Discussed taking medications as prescribed. Reviewed return precautions including worsening fever, SOB, worsening cough or other concerns. Push fluids and rest. I recommend that patient follow-up if symptoms worsen or persist despite treatment x 7-10 days, sooner if needed. -     methylPREDNISolone acetate (DEPO-MEDROL) injection 80 mg  Other orders -     azithromycin (ZITHROMAX) 250 MG tablet; Take two tablets on day 1, then one tablet daily x 4 days -     chlorpheniramine-HYDROcodone (TUSSIONEX PENNKINETIC ER) 10-8 MG/5ML SUER; Take 5 mLs by mouth at bedtime as needed for cough.  . Reviewed expectations re: course of current medical issues. . Discussed self-management of symptoms. . Outlined signs and symptoms indicating need for more acute intervention. . Patient verbalized understanding and all questions were answered. . See orders for this visit as documented in the electronic medical record. . Patient received an After-Visit Summary.  CMA or LPN served as scribe during this visit. History, Physical, and Plan performed by medical provider. The above documentation has been reviewed and is accurate and complete.  Inda Coke, PA-C

## 2018-02-19 NOTE — Patient Instructions (Addendum)
It was great to see you!  Use medication as prescribed: azithromycin antibiotic and tussionex  You may also use plain Mucinex for your congestion.  Push fluids and get plenty of rest. Please return if you are not improving as expected, or if you have high fevers (>101.5) or difficulty swallowing or worsening productive cough.  Call clinic with questions.  I hope you start feeling better soon!

## 2018-03-19 ENCOUNTER — Other Ambulatory Visit: Payer: Self-pay | Admitting: Physician Assistant

## 2018-03-28 ENCOUNTER — Other Ambulatory Visit: Payer: Self-pay | Admitting: *Deleted

## 2018-03-28 MED ORDER — AMITRIPTYLINE HCL 50 MG PO TABS: ORAL_TABLET | ORAL | 1 refills | Status: DC

## 2018-04-20 ENCOUNTER — Other Ambulatory Visit: Payer: Self-pay

## 2018-04-20 MED ORDER — AMLODIPINE BESYLATE 5 MG PO TABS: 5.0000 mg | ORAL_TABLET | Freq: Every day | ORAL | 2 refills | Status: DC

## 2018-04-26 ENCOUNTER — Encounter: Payer: Self-pay | Admitting: Physician Assistant

## 2018-04-26 ENCOUNTER — Ambulatory Visit (INDEPENDENT_AMBULATORY_CARE_PROVIDER_SITE_OTHER): Payer: BLUE CROSS/BLUE SHIELD | Admitting: Physician Assistant

## 2018-04-26 ENCOUNTER — Other Ambulatory Visit: Payer: Self-pay

## 2018-04-26 VITALS — BP 134/82 | HR 100 | Temp 98.2°F | Ht 72.0 in | Wt 259.2 lb

## 2018-04-26 DIAGNOSIS — R05 Cough: Secondary | ICD-10-CM

## 2018-04-26 DIAGNOSIS — R222 Localized swelling, mass and lump, trunk: Secondary | ICD-10-CM

## 2018-04-26 DIAGNOSIS — R059 Cough, unspecified: Secondary | ICD-10-CM

## 2018-04-26 MED ORDER — HYDROCOD POLST-CPM POLST ER 10-8 MG/5ML PO SUER: 5.0000 mL | Freq: Every evening | ORAL | 0 refills | Status: DC | PRN

## 2018-04-26 MED ORDER — FLUTICASONE PROPIONATE 50 MCG/ACT NA SUSP: 2.0000 | Freq: Every day | NASAL | 1 refills | Status: DC

## 2018-04-26 MED ORDER — METHYLPREDNISOLONE ACETATE 80 MG/ML IJ SUSP
80.0000 mg | Freq: Once | INTRAMUSCULAR | Status: AC
  Administered 2018-04-26: 80 mg via INTRAMUSCULAR

## 2018-04-26 NOTE — Progress Notes (Signed)
MICAIAH LITLE is a 63 y.o. male here for an acute visit.  History of Present Illness:   Lonell Grandchild, CMA acting as Education administrator for [employer], [place].    Cough  This is a new problem. The current episode started 1 to 4 weeks ago. The problem has been unchanged. The problem occurs constantly. The cough is non-productive. Pertinent negatives include no chest pain, ear congestion, ear pain, fever or headaches. He has tried OTC cough suppressant and prescription cough suppressant for the symptoms. The treatment provided mild relief. There is no history of asthma or COPD.    R breast lump Has been present for several years. He states that he had an u/s done about 2-3 years ago, but I don't have record of this. He states that they told him he likely had a cyst. He feels like the area is getting bigger. It is tender at times.    PMHx, SurgHx, SocialHx, Medications, and Allergies were reviewed in the Visit Navigator and updated as appropriate.  Current Medications   Current Outpatient Medications:  .  amitriptyline (ELAVIL) 50 MG tablet, TAKE 1 TABLET BY MOUTH EVERYDAY AT BEDTIME, Disp: 90 tablet, Rfl: 1 .  amLODipine (NORVASC) 5 MG tablet, Take 1 tablet (5 mg total) by mouth daily., Disp: 30 tablet, Rfl: 2 .  azithromycin (ZITHROMAX) 250 MG tablet, Take two tablets on day 1, then one tablet daily x 4 days, Disp: 6 tablet, Rfl: 0 .  chlorpheniramine-HYDROcodone (TUSSIONEX PENNKINETIC ER) 10-8 MG/5ML SUER, Take 5 mLs by mouth at bedtime as needed for cough., Disp: 140 mL, Rfl: 0 .  fluticasone (FLONASE) 50 MCG/ACT nasal spray, Place 2 sprays into both nostrils daily., Disp: 16 g, Rfl: 1 .  loratadine (CLARITIN) 10 MG tablet, Take 10 mg by mouth daily., Disp: , Rfl:  .  omeprazole (PRILOSEC) 20 MG capsule, Take 1 capsule (20 mg total) by mouth daily., Disp: 30 capsule, Rfl: 3   Allergies  Allergen Reactions  . Morphine And Related Rash   Review of Systems   Pertinent items are  noted in the HPI. Otherwise, ROS is negative.  Vitals   Vitals:   04/26/18 1018  BP: 134/82  Pulse: 100  Temp: 98.2 F (36.8 C)  TempSrc: Oral  SpO2: 96%  Weight: 259 lb 3.2 oz (117.6 kg)  Height: 6' (1.829 m)     Body mass index is 35.15 kg/m.  Physical Exam   Physical Exam Vitals signs and nursing note reviewed.  Constitutional:      General: He is not in acute distress.    Appearance: He is well-developed. He is not ill-appearing or toxic-appearing.  HENT:     Head: Normocephalic and atraumatic.     Right Ear: Tympanic membrane, ear canal and external ear normal. Tympanic membrane is not erythematous, retracted or bulging.     Left Ear: Tympanic membrane, ear canal and external ear normal. Tympanic membrane is not erythematous, retracted or bulging.     Nose: Mucosal edema, congestion and rhinorrhea present.     Right Sinus: Frontal sinus tenderness present. No maxillary sinus tenderness.     Left Sinus: Frontal sinus tenderness present. No maxillary sinus tenderness.     Mouth/Throat:     Lips: Pink.     Mouth: Mucous membranes are moist.     Pharynx: Uvula midline. Posterior oropharyngeal erythema present.     Tonsils: Swelling: 0 on the right. 0 on the left.  Eyes:     General:  Lids are normal.     Conjunctiva/sclera: Conjunctivae normal.  Neck:     Trachea: Trachea normal.  Cardiovascular:     Rate and Rhythm: Normal rate and regular rhythm.     Heart sounds: Normal heart sounds, S1 normal and S2 normal.  Pulmonary:     Effort: Pulmonary effort is normal.     Breath sounds: Normal breath sounds. No decreased breath sounds, wheezing, rhonchi or rales.  Chest:    Lymphadenopathy:     Cervical: No cervical adenopathy.  Skin:    General: Skin is warm and dry.  Neurological:     Mental Status: He is alert.  Psychiatric:        Speech: Speech normal.        Behavior: Behavior normal. Behavior is cooperative.         Assessment and Plan   Finas was  seen today for cough.  Diagnoses and all orders for this visit:  Cough No red flags on exam.  Suspect bronchitis. Received depo-medrol injection in the office and tolerated well. z-pack sent in. Tussionex. Discussed taking medications as prescribed. Reviewed return precautions including worsening fever, SOB, worsening cough or other concerns. Push fluids and rest. I recommend that patient follow-up if symptoms worsen or persist despite treatment x 7-10 days, sooner if needed. -     methylPREDNISolone acetate (DEPO-MEDROL) injection 80 mg  Nodule of right anterior chest wall Will order u/s imaging.   Other orders -     fluticasone (FLONASE) 50 MCG/ACT nasal spray; Place 2 sprays into both nostrils daily. -     chlorpheniramine-HYDROcodone (TUSSIONEX PENNKINETIC ER) 10-8 MG/5ML SUER; Take 5 mLs by mouth at bedtime as needed for cough.    . Reviewed expectations re: course of current medical issues. . Discussed self-management of symptoms. . Outlined signs and symptoms indicating need for more acute intervention. . Patient verbalized understanding and all questions were answered. Marland Kitchen Health Maintenance issues including appropriate healthy diet, exercise, and smoking avoidance were discussed with patient. . See orders for this visit as documented in the electronic medical record. . Patient received an After Visit Summary.   CMA served as Education administrator during this visit. History, Physical, and Plan performed by medical provider. The above documentation has been reviewed and is accurate and complete. [place], [place], [place] 04/26/2018

## 2018-04-26 NOTE — Addendum Note (Signed)
Addended by: Erlene Quan on: 04/26/2018 11:19 AM   Modules accepted: Orders

## 2018-05-01 ENCOUNTER — Other Ambulatory Visit: Payer: Self-pay | Admitting: Physician Assistant

## 2018-05-01 DIAGNOSIS — R222 Localized swelling, mass and lump, trunk: Secondary | ICD-10-CM

## 2018-05-11 ENCOUNTER — Other Ambulatory Visit: Payer: Self-pay

## 2018-05-18 ENCOUNTER — Other Ambulatory Visit: Payer: Self-pay | Admitting: Physician Assistant

## 2018-07-25 ENCOUNTER — Encounter: Payer: Self-pay | Admitting: Physician Assistant

## 2018-07-25 ENCOUNTER — Other Ambulatory Visit: Payer: Self-pay | Admitting: Physician Assistant

## 2018-07-25 ENCOUNTER — Ambulatory Visit (INDEPENDENT_AMBULATORY_CARE_PROVIDER_SITE_OTHER): Payer: BC Managed Care – PPO | Admitting: Physician Assistant

## 2018-07-25 DIAGNOSIS — I10 Essential (primary) hypertension: Secondary | ICD-10-CM

## 2018-07-25 NOTE — Progress Notes (Signed)
Virtual Visit via Video   I connected with Anthony Miller on 07/25/18 at  1:20 PM EDT by a video enabled telemedicine application and verified that I am speaking with the correct person using two identifiers. Location patient: Home Location provider: [facility], Office Persons participating in the virtual visit: Anthony Miller, Inda Coke PA-C, Anthony Pickler, LPN   I discussed the limitations of evaluation and management by telemedicine and the availability of in person appointments. The patient expressed understanding and agreed to proceed.  I acted as a Education administrator for [employer], [employer], LPN  Subjective:   HPI:  Hypertension Pt following up today on blood pressure. Currently on Norvasc 5 mg. He is checking his blood pressure 1-2 x's a week. Averaging 126/82. Pt denies headaches, dizziness, blurred vision, chest pain, SOB or lower leg edema. Denies excessive caffeine intake, stimulant usage, excessive alcohol intake or increase in salt consumption.  ROS: See pertinent positives and negatives per HPI.  Patient Active Problem List   Diagnosis Date Noted  . Obstructive sleep apnea syndrome 10/25/2017  . Snoring 10/25/2017  . Acute recurrent pansinusitis 10/25/2017  . Reflex apnea 10/25/2017  . 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 Lipoma of anterior chest wall 03/11/2016  . Elevated hemoglobin ([place]) 10/25/2015  . Hypertriglyceridemia 10/25/2015  . History of esophageal stricture 10/07/2015  . IBS (irritable bowel syndrome) 10/07/2015  . Dyslipidemia 10/07/2015  . Insomnia 10/07/2015  . Radiculopathy 06/26/2013  . History of back surgery 06/21/2013  . HTN (hypertension) 06/21/2013  . GERD (gastroesophageal reflux disease) 06/02/2008  . Perennial allergic rhinitis with seasonal variation 05/08/2008    Social History   Tobacco Use  . Smoking status: Never Smoker  . Smokeless tobacco: Former Systems developer    Types: Chew  . Tobacco comment: "quit chewing in the  1980's"  Substance Use Topics  . Alcohol use: Yes    Alcohol/week: 2.0 standard drinks    Types: 2 Shots of liquor per week    Current Outpatient Medications:  .  amitriptyline (ELAVIL) 50 MG tablet, TAKE 1 TABLET BY MOUTH EVERYDAY AT BEDTIME, Disp: 90 tablet, Rfl: 1 .  fluticasone (FLONASE) 50 MCG/ACT nasal spray, SPRAY 2 SPRAYS INTO EACH NOSTRIL EVERY DAY, Disp: 16 g, Rfl: 2 .  loratadine (CLARITIN) 10 MG tablet, Take 10 mg by mouth daily., Disp: , Rfl:  .  Omega-3 Fatty Acids (FISH OIL) 1000 MG CAPS, Take 1 capsule by mouth daily., Disp: , Rfl:  .  omeprazole (PRILOSEC) 20 MG capsule, Take 1 capsule (20 mg total) by mouth daily., Disp: 30 capsule, Rfl: 3 .  vitamin C (ASCORBIC ACID) 500 MG tablet, Take 500-1,000 mg by mouth daily., Disp: , Rfl:  .  amLODipine (NORVASC) 5 MG tablet, TAKE 1 TABLET BY MOUTH EVERY DAY, Disp: 30 tablet, Rfl: 5  Allergies  Allergen Reactions  . Morphine And Related Rash    Objective:   VITALS: Per patient if applicable, see vitals. GENERAL: Alert, appears well and in no acute distress. HEENT: Atraumatic, conjunctiva clear, no obvious abnormalities on inspection of external nose and ears. NECK: Normal movements of the head and neck. CARDIOPULMONARY: No increased WOB. Speaking in clear sentences. I:E ratio WNL.  MS: Moves all visible extremities without noticeable abnormality. PSYCH: Pleasant and cooperative, well-groomed. Speech normal rate and rhythm. Affect is appropriate. Insight and judgement are appropriate. Attention is focused, linear, and appropriate.  NEURO: CN grossly intact. Oriented as arrived to appointment on time with no prompting. Moves both  UE equally.  SKIN: No obvious lesions, wounds, erythema, or cyanosis noted on face or hands.  Assessment and Plan:   Anthony Miller was seen today for hypertension.  Diagnoses and all orders for this visit:  Essential hypertension   Normotensive. Continue Norvasc 5 mg daily. Follow-up in 6 months,  sooner if any issues.   . Reviewed expectations re: course of current medical issues. . Discussed self-management of symptoms. . Outlined signs and symptoms indicating need for more acute intervention. . Patient verbalized understanding and all questions were answered. Marland Kitchen Health Maintenance issues including appropriate healthy diet, exercise, and smoking avoidance were discussed with patient. . See orders for this visit as documented in the electronic medical record.  I discussed the assessment and treatment plan with the patient. The patient was provided an opportunity to ask questions and all were answered. The patient agreed with the plan and demonstrated an understanding of the instructions.   The patient was advised to call back or seek an in-person evaluation if the symptoms worsen or if the condition fails to improve as anticipated.   CMA or LPN served as scribe during this visit. History, Physical, and Plan performed by medical provider. The above documentation has been reviewed and is accurate and complete.   [place], [place] 07/25/2018

## 2018-09-12 ENCOUNTER — Ambulatory Visit: Payer: Self-pay

## 2018-09-12 NOTE — Telephone Encounter (Signed)
Out going call to Patient who complains of sneezing , post nasal  drip.  and coughing.   Onset was 3 weeks ago.  Rate the cough medium to sever. Sputum is yellow in color.   Denies hemoptysis. Reports asthma, bronchitis, and allergies, runny nose.  Office was closed awaits to hear from PCP>  [facility].        [place] Male, 63 y.o., March 21, 1955 MRN:  [id number] Phone:  [phone] Anthony Miller) PCP:  Inda Coke, PA Primary Cvg:  Generic Worker's Comp/Generic Worker's Comp Message from Valla Leaver sent at 09/12/2018 2:59 PM EDT  Summary: sneeze, post nasal drip   post nasal drip, cough, sneezing         Call History   Type Contact Phone User  09/12/2018 02:52 PM EDT Phone (Incoming) Anthony Miller, Anthony Miller (Self) [phone] Anthony Miller, Anthony Miller    Answer Assessment - Initial Assessment Questions 1. ONSET: "When did the cough begin?"    3 weeks ago 2. SEVERITY: "How bad is the cough today?"     Med and severe 3. RESPIRATORY DISTRESS: "Describe your breathing."      fine 4. FEVER: "Do you have a fever?" If so, ask: "What is your temperature, how was it measured, and when did it start?"     denies 5. SPUTUM: "Describe the color of your sputum" (clear, white, yellow, green)     yellow 6. HEMOPTYSIS: "Are you coughing up any blood?" If so ask: "How much?" (flecks, streaks, tablespoons, etc.)     *deniesARDIAC HISTORY: "Do you have any history of heart disease?" (e.g., heart attack, congestive heart failure)      *No Answer* 8. LUNG HISTORY: "Do you have any history of lung disease?"  (e.g., pulmonary embolus, asthma, emphysema)     Asthma alittle,  Bronchitis  allergies 9. PE RISK FACTORS: "Do you have a history of blood clots?" (or: recent major surgery, recent prolonged travel, bedridden)      10. OTHER SYMPTOMS: "Do you have any other symptoms?" (e.g., runny nose, wheezing, chest pain)       runny 11. PREGNANCY: "Is there any chance you are pregnant?"  "When was your last menstrual period?"       na 12. TRAVEL: "Have you traveled out of the country in the last month?" (e.g., travel history, exposures)       na  Protocols used: [place]

## 2018-09-13 ENCOUNTER — Ambulatory Visit (INDEPENDENT_AMBULATORY_CARE_PROVIDER_SITE_OTHER): Payer: BC Managed Care – PPO | Admitting: Physician Assistant

## 2018-09-13 ENCOUNTER — Encounter: Payer: Self-pay | Admitting: Physician Assistant

## 2018-09-13 ENCOUNTER — Other Ambulatory Visit: Payer: Self-pay

## 2018-09-13 DIAGNOSIS — R05 Cough: Secondary | ICD-10-CM | POA: Diagnosis not present

## 2018-09-13 DIAGNOSIS — Z20828 Contact with and (suspected) exposure to other viral communicable diseases: Secondary | ICD-10-CM

## 2018-09-13 DIAGNOSIS — R059 Cough, unspecified: Secondary | ICD-10-CM

## 2018-09-13 MED ORDER — PREDNISONE 20 MG PO TABS: 40.0000 mg | ORAL_TABLET | Freq: Every day | ORAL | 0 refills | Status: DC

## 2018-09-13 MED ORDER — AZITHROMYCIN 250 MG PO TABS: ORAL_TABLET | ORAL | 0 refills | Status: DC

## 2018-09-13 NOTE — Progress Notes (Signed)
Virtual Visit via Video   I connected with Anthony Miller on 09/13/18 at 10:20 AM EDT by a video enabled telemedicine application and verified that I am speaking with the correct person using two identifiers. Location patient: Home Location provider: [facility], Office Persons participating in the virtual visit: Rober, [place] PA-C.   I discussed the limitations of evaluation and management by telemedicine and the availability of in person appointments. The patient expressed understanding and agreed to proceed.  I acted as a Education administrator for [employer], [employer], LPN  Subjective:   HPI:   Cough Pt c/o cough x 3 weeks, expectorating yellow sputum. Also having post nasal drip and sneezing. Pt is using Flonase at night, no relief. He has not tried anything else. Denies fever, chills, chest congestion or chest pain and no SOB. This feels like his typical bronchitis/sinusitis he gets a few times a year. Denies COVID-19 contacts.  ROS: See pertinent positives and negatives per HPI.  Patient Active Problem List   Diagnosis Date Noted  . Obstructive sleep apnea syndrome 10/25/2017  . Snoring 10/25/2017  . Acute recurrent pansinusitis 10/25/2017  . Reflex apnea 10/25/2017  . 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 Lipoma of anterior chest wall 03/11/2016  . Elevated hemoglobin ([place]) 10/25/2015  . Hypertriglyceridemia 10/25/2015  . History of esophageal stricture 10/07/2015  . IBS (irritable bowel syndrome) 10/07/2015  . Dyslipidemia 10/07/2015  . Insomnia 10/07/2015  . Radiculopathy 06/26/2013  . History of back surgery 06/21/2013  . HTN (hypertension) 06/21/2013  . GERD (gastroesophageal reflux disease) 06/02/2008  . Perennial allergic rhinitis with seasonal variation 05/08/2008    Social History   Tobacco Use  . Smoking status: Never Smoker  . Smokeless tobacco: Former Systems developer    Types: Chew  . Tobacco comment: "quit chewing in the 1980's"   Substance Use Topics  . Alcohol use: Yes    Alcohol/week: 2.0 standard drinks    Types: 2 Shots of liquor per week    Current Outpatient Medications:  .  amitriptyline (ELAVIL) 50 MG tablet, TAKE 1 TABLET BY MOUTH EVERYDAY AT BEDTIME, Disp: 90 tablet, Rfl: 1 .  amLODipine (NORVASC) 5 MG tablet, TAKE 1 TABLET BY MOUTH EVERY DAY, Disp: 30 tablet, Rfl: 5 .  fluticasone (FLONASE) 50 MCG/ACT nasal spray, SPRAY 2 SPRAYS INTO EACH NOSTRIL EVERY DAY, Disp: 16 g, Rfl: 2 .  loratadine (CLARITIN) 10 MG tablet, Take 10 mg by mouth daily., Disp: , Rfl:  .  Omega-3 Fatty Acids (FISH OIL) 1000 MG CAPS, Take 1 capsule by mouth daily., Disp: , Rfl:  .  omeprazole (PRILOSEC) 20 MG capsule, Take 1 capsule (20 mg total) by mouth daily., Disp: 30 capsule, Rfl: 3 .  vitamin C (ASCORBIC ACID) 500 MG tablet, Take 500-1,000 mg by mouth daily., Disp: , Rfl:  .  azithromycin (ZITHROMAX) 250 MG tablet, Take two tablets on day 1, the one daily x 4 days, Disp: 6 tablet, Rfl: 0 .  predniSONE (DELTASONE) 20 MG tablet, Take 2 tablets (40 mg total) by mouth daily., Disp: 10 tablet, Rfl: 0  Allergies  Allergen Reactions  . Morphine And Related Rash    Objective:   VITALS: Per patient if applicable, see vitals. GENERAL: Alert, appears well and in no acute distress. HEENT: Atraumatic, conjunctiva clear, no obvious abnormalities on inspection of external nose and ears. NECK: Normal movements of the head and neck. CARDIOPULMONARY: No increased WOB. Speaking in clear sentences. I:E ratio WNL.  MS:  Moves all visible extremities without noticeable abnormality. PSYCH: Pleasant and cooperative, well-groomed. Speech normal rate and rhythm. Affect is appropriate. Insight and judgement are appropriate. Attention is focused, linear, and appropriate.  NEURO: CN grossly intact. Oriented as arrived to appointment on time with no prompting. Moves both UE equally.  SKIN: No obvious lesions, wounds, erythema, or cyanosis noted on face  or hands.  Assessment and Plan:   Anthony Miller was seen today for cough.  Diagnoses and all orders for this visit:  Cough Patient has a respiratory illness without signs of acute distress or respiratory compromise at this time. Will trial z-pack and prednisone as he typically responds well to this when he develops this type of cough. We are going to send patient for drive-up testing. As a precaution, they have been advised to remain home until COVID-19 results and then possible further quarantine after that based on results and symptoms. Advised if they experience a "second sickening" or worsening symptoms as the illness progresses, they are to call the office for further instructions or seek emergent evaluation for any severe symptoms.  -     Novel Coronavirus, NAA ([facility])  Other orders -     azithromycin (ZITHROMAX) 250 MG tablet; Take two tablets on day 1, the one daily x 4 days -     predniSONE (DELTASONE) 20 MG tablet; Take 2 tablets (40 mg total) by mouth daily.  . Reviewed expectations re: course of current medical issues. . Discussed self-management of symptoms. . Outlined signs and symptoms indicating need for more acute intervention. . Patient verbalized understanding and all questions were answered. Marland Kitchen Health Maintenance issues including appropriate healthy diet, exercise, and smoking avoidance were discussed with patient. . See orders for this visit as documented in the electronic medical record.  I discussed the assessment and treatment plan with the patient. The patient was provided an opportunity to ask questions and all were answered. The patient agreed with the plan and demonstrated an understanding of the instructions.   The patient was advised to call back or seek an in-person evaluation if the symptoms worsen or if the condition fails to improve as anticipated.   CMA or LPN served as scribe during this visit. History, Physical, and Plan performed by medical provider. The  above documentation has been reviewed and is accurate and complete.   Inda Coke, [place] 09/13/2018

## 2018-09-13 NOTE — Telephone Encounter (Signed)
Spoke to pt told him needs a virtual visit to be evaluated, can you do a Doxy visit today? Pt said yes. Told him okay I will put you in for 11:20. Pt verbalized understanding.

## 2018-10-01 ENCOUNTER — Other Ambulatory Visit: Payer: Self-pay | Admitting: Physician Assistant

## 2018-10-01 MED ORDER — AMITRIPTYLINE HCL 50 MG PO TABS: ORAL_TABLET | ORAL | 0 refills | Status: DC

## 2018-10-01 NOTE — Telephone Encounter (Signed)
Medication Refill - Medication: amitriptyline (ELAVIL) 50 MG tablet  Has the patient contacted their pharmacy? Yes - was told to contact PCP (Agent: If no, request that the patient contact the pharmacy for the refill.) (Agent: If yes, when and what did the pharmacy advise?)  Preferred Pharmacy (with phone number or street name):  [id number] - [place], [place] - [address] [phone] (Phone) [phone] (Fax)   Agent: Please be advised that RX refills may take up to 3 business days. We ask that you follow-up with your pharmacy.

## 2019-02-03 ENCOUNTER — Other Ambulatory Visit: Payer: Self-pay | Admitting: Physician Assistant

## 2019-02-04 ENCOUNTER — Other Ambulatory Visit: Payer: Self-pay | Admitting: Physician Assistant

## 2019-02-04 MED ORDER — AMITRIPTYLINE HCL 50 MG PO TABS: ORAL_TABLET | ORAL | 0 refills | Status: DC

## 2019-02-13 ENCOUNTER — Ambulatory Visit (INDEPENDENT_AMBULATORY_CARE_PROVIDER_SITE_OTHER): Payer: BC Managed Care – PPO | Admitting: Physician Assistant

## 2019-02-13 ENCOUNTER — Encounter: Payer: Self-pay | Admitting: Physician Assistant

## 2019-02-13 DIAGNOSIS — Z7189 Other specified counseling: Secondary | ICD-10-CM

## 2019-02-13 DIAGNOSIS — R05 Cough: Secondary | ICD-10-CM | POA: Diagnosis not present

## 2019-02-13 DIAGNOSIS — R059 Cough, unspecified: Secondary | ICD-10-CM

## 2019-02-13 MED ORDER — ALBUTEROL SULFATE HFA 108 (90 BASE) MCG/ACT IN AERS: 2.0000 | INHALATION_SPRAY | Freq: Four times a day (QID) | RESPIRATORY_TRACT | 1 refills | Status: DC | PRN

## 2019-02-13 MED ORDER — PREDNISONE 20 MG PO TABS: 40.0000 mg | ORAL_TABLET | Freq: Every day | ORAL | 0 refills | Status: DC

## 2019-02-13 MED ORDER — AZITHROMYCIN 250 MG PO TABS: ORAL_TABLET | ORAL | 0 refills | Status: DC

## 2019-02-13 NOTE — Progress Notes (Signed)
Virtual Visit via Video   I connected with Anthony Miller on 02/13/19 at  4:00 PM EST by a video enabled telemedicine application and verified that I am speaking with the correct person using two identifiers. Location patient: Home Location provider: [facility], Office Persons participating in the virtual visit: TYWUAN TAKUSHI, Inda Coke PA-C, Anselmo Pickler, LPN   I discussed the limitations of evaluation and management by telemedicine and the availability of in person appointments. The patient expressed understanding and agreed to proceed.  I acted as a Education administrator for [employer], [employer], LPN  Subjective:   HPI:   Cough Pt c/o cough x 5 weeks, he is coughing and expectorating white/yellow frothy sputum. He says he is very congested. Has been using his Proair Inhaler. He has been having coughing spells and has had 4 episodes where he passed out for about 30 seconds due to coughing so much and then comes back. Denies: chest pain, SOB, diaphoresis, fever, chills, COVID-19 contacts.   He is very active with maintaining his current house and is currently preparing to move into a new house. He has been packing boxes and doing heavy lifting and denies any chest pain, SOB, malaise, or other symptoms while exerting self.  ROS: See pertinent positives and negatives per HPI.  Patient Active Problem List   Diagnosis Date Noted  . Obstructive sleep apnea syndrome 10/25/2017  . Snoring 10/25/2017  . Acute recurrent pansinusitis 10/25/2017  . Reflex apnea 10/25/2017  . 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 Lipoma of anterior chest wall 03/11/2016  . Elevated hemoglobin ([place]) 10/25/2015  . Hypertriglyceridemia 10/25/2015  . History of esophageal stricture 10/07/2015  . IBS (irritable bowel syndrome) 10/07/2015  . Dyslipidemia 10/07/2015  . Insomnia 10/07/2015  . Radiculopathy 06/26/2013  . History of back surgery 06/21/2013  . HTN (hypertension) 06/21/2013  . GERD  (gastroesophageal reflux disease) 06/02/2008  . Perennial allergic rhinitis with seasonal variation 05/08/2008    Social History   Tobacco Use  . Smoking status: Never Smoker  . Smokeless tobacco: Former Systems developer    Types: Chew  . Tobacco comment: "quit chewing in the 1980's"  Substance Use Topics  . Alcohol use: Yes    Alcohol/week: 2.0 standard drinks    Types: 2 Shots of liquor per week    Current Outpatient Medications:  .  amitriptyline (ELAVIL) 50 MG tablet, TAKE 1 TABLET BY MOUTH EVERYDAY AT BEDTIME, Disp: 90 tablet, Rfl: 0 .  amLODipine (NORVASC) 5 MG tablet, TAKE 1 TABLET BY MOUTH EVERY DAY, Disp: 30 tablet, Rfl: 0 .  fluticasone (FLONASE) 50 MCG/ACT nasal spray, SPRAY 2 SPRAYS INTO EACH NOSTRIL EVERY DAY, Disp: 16 g, Rfl: 2 .  loratadine (CLARITIN) 10 MG tablet, Take 10 mg by mouth daily as needed. , Disp: , Rfl:  .  Omega-3 Fatty Acids (FISH OIL) 1000 MG CAPS, Take 1 capsule by mouth daily., Disp: , Rfl:  .  omeprazole (PRILOSEC) 20 MG capsule, Take 1 capsule (20 mg total) by mouth daily., Disp: 30 capsule, Rfl: 3 .  vitamin C (ASCORBIC ACID) 500 MG tablet, Take 500-1,000 mg by mouth daily., Disp: , Rfl:  .  albuterol (VENTOLIN HFA) 108 (90 Base) MCG/ACT inhaler, Inhale 2 puffs into the lungs every 6 (six) hours as needed for wheezing or shortness of breath., Disp: 18 g, Rfl: 1 .  azithromycin (ZITHROMAX) 250 MG tablet, Take two tablets on day 1, then one tablet daily x 4 days, Disp: 6 tablet, Rfl:  0 .  predniSONE (DELTASONE) 20 MG tablet, Take 2 tablets (40 mg total) by mouth daily., Disp: 10 tablet, Rfl: 0  Allergies  Allergen Reactions  . Morphine And Related Rash    Objective:   VITALS: Per patient if applicable, see vitals. GENERAL: Alert, appears well and in no acute distress. HEENT: Atraumatic, conjunctiva clear, no obvious abnormalities on inspection of external nose and ears. NECK: Normal movements of the head and neck. CARDIOPULMONARY: No increased WOB.  Speaking in clear sentences. I:E ratio WNL.  MS: Moves all visible extremities without noticeable abnormality. PSYCH: Pleasant and cooperative, well-groomed. Speech normal rate and rhythm. Affect is appropriate. Insight and judgement are appropriate. Attention is focused, linear, and appropriate.  NEURO: CN grossly intact. Oriented as arrived to appointment on time with no prompting. Moves both UE equally.  SKIN: No obvious lesions, wounds, erythema, or cyanosis noted on face or hands.  Assessment and Plan:   Jordell was seen today for cough.  Diagnoses and all orders for this visit:  Cough  Advice given about COVID-19 virus infection  Other orders -     azithromycin (ZITHROMAX) 250 MG tablet; Take two tablets on day 1, then one tablet daily x 4 days -     predniSONE (DELTASONE) 20 MG tablet; Take 2 tablets (40 mg total) by mouth daily. -     albuterol (VENTOLIN HFA) 108 (90 Base) MCG/ACT inhaler; Inhale 2 puffs into the lungs every 6 (six) hours as needed for wheezing or shortness of breath.   Patient has a respiratory illness without signs of acute distress or respiratory compromise at this time.   Given chronicity of illness, will begin oral antibiotic to cover for bacterial sinusitis and bronchitis, will treat with oral zpack, oral prednisone and albuterol prn. Strict ER precautions given. Discussed that if he develops any chest pain, SOB, malaise or other symptoms, he must immediately go to the ER.  We are going to send patient for drive-up testing. As a precaution, they have been advised to remain home until COVID-19 results and then possible further quarantine after that based on results and symptoms. Advised if they experience a "second sickening" or worsening symptoms as the illness progresses, they are to call the office for further instructions or seek emergent evaluation for any severe symptoms.   . Reviewed expectations re: course of current medical issues. . Discussed  self-management of symptoms. . Outlined signs and symptoms indicating need for more acute intervention. . Patient verbalized understanding and all questions were answered. Marland Kitchen Health Maintenance issues including appropriate healthy diet, exercise, and smoking avoidance were discussed with patient. . See orders for this visit as documented in the electronic medical record.  I discussed the assessment and treatment plan with the patient. The patient was provided an opportunity to ask questions and all were answered. The patient agreed with the plan and demonstrated an understanding of the instructions.   The patient was advised to call back or seek an in-person evaluation if the symptoms worsen or if the condition fails to improve as anticipated.   CMA or LPN served as scribe during this visit. History, Physical, and Plan performed by medical provider. The above documentation has been reviewed and is accurate and complete.   Inda Coke, [place] 02/13/2019

## 2019-02-14 ENCOUNTER — Ambulatory Visit: Payer: BC Managed Care – PPO | Attending: Internal Medicine

## 2019-02-14 ENCOUNTER — Telehealth: Payer: Self-pay | Admitting: Physician Assistant

## 2019-02-14 DIAGNOSIS — Z20828 Contact with and (suspected) exposure to other viral communicable diseases: Secondary | ICD-10-CM | POA: Diagnosis not present

## 2019-02-14 DIAGNOSIS — Z20822 Contact with and (suspected) exposure to covid-19: Secondary | ICD-10-CM

## 2019-02-14 NOTE — Telephone Encounter (Signed)
Please call patient.  After reviewing his case with Dr. Jerline Pain, specifically regarding the issues he is having with passing out, I recommend that he gets COVID-19 tested and then makes an appointment for next week so we can work this up.  Will likely need to do EKG and check labs. After this, we will likely need to order Echo and possibly even a Holter monitor.  Alternatively, we can send patient to cardiology if he would like this instead.  Please let provider know what patient decides.  Inda Coke PA-C

## 2019-02-14 NOTE — Telephone Encounter (Signed)
Spoke to pt told him I have a message from [place], she said after reviewing his case with Dr. Jerline Pain, specifically regarding the issues he is having with passing out, I recommend that he gets COVID-19 tested and then makes an appointment for next week so we can work this up. Pt verbalized understanding and said went for testing this morning. Also told him you will likely need to do EKG and check labs. After this, we will likely need to order Echo and possibly even a Holter monitor. Alternatively, we can send you to cardiology if you would like this instead. Pt verbalized understanding and said he will have work up in the office and go from there. Appt scheduled for 02/19/2019 at 9:20 AM with Samantha. Pt verbalized understanding. Samantha notified pt scheduled for here.

## 2019-02-15 LAB — NOVEL CORONAVIRUS, NAA: SARS-CoV-2, NAA: NOT DETECTED

## 2019-02-18 ENCOUNTER — Other Ambulatory Visit: Payer: Self-pay

## 2019-02-19 ENCOUNTER — Ambulatory Visit (INDEPENDENT_AMBULATORY_CARE_PROVIDER_SITE_OTHER): Payer: BC Managed Care – PPO | Admitting: Physician Assistant

## 2019-02-19 ENCOUNTER — Encounter: Payer: Self-pay | Admitting: Physician Assistant

## 2019-02-19 VITALS — BP 140/90 | HR 96 | Temp 98.1°F | Ht 72.0 in | Wt 257.0 lb

## 2019-02-19 DIAGNOSIS — R55 Syncope and collapse: Secondary | ICD-10-CM | POA: Diagnosis not present

## 2019-02-19 LAB — COMPREHENSIVE METABOLIC PANEL
ALT: 26 U/L (ref 0–53)
AST: 19 U/L (ref 0–37)
Albumin: 4.5 g/dL (ref 3.5–5.2)
Alkaline Phosphatase: 56 U/L (ref 39–117)
BUN: 13 mg/dL (ref 6–23)
CO2: 28 mEq/L (ref 19–32)
Calcium: 9.5 mg/dL (ref 8.4–10.5)
Chloride: 102 mEq/L (ref 96–112)
Creatinine, Ser: 1.17 mg/dL (ref 0.40–1.50)
GFR: 63.35 mL/min (ref >60.00)
Glucose, Bld: 108 mg/dL — ABNORMAL HIGH (ref 70–99)
Potassium: 4.3 mEq/L (ref 3.5–5.1)
Sodium: 138 mEq/L (ref 135–145)
Total Bilirubin: 0.6 mg/dL (ref 0.2–1.2)
Total Protein: 7.2 g/dL (ref 6.0–8.3)

## 2019-02-19 LAB — TSH: TSH: 1.68 u[IU]/mL (ref 0.35–4.50)

## 2019-02-19 LAB — CBC WITH DIFFERENTIAL/PLATELET
Basophils Absolute: 0 10*3/uL (ref 0.0–0.1)
Basophils Relative: 0.3 % (ref 0.0–3.0)
Eosinophils Absolute: 0.1 10*3/uL (ref 0.0–0.7)
Eosinophils Relative: 0.6 % (ref 0.0–5.0)
HCT: 49 % (ref 39.0–52.0)
Hemoglobin: 16.6 g/dL (ref 13.0–17.0)
Lymphocytes Relative: 19.8 % (ref 12.0–46.0)
Lymphs Abs: 2.3 10*3/uL (ref 0.7–4.0)
MCHC: 33.9 g/dL (ref 30.0–36.0)
MCV: 90.4 fl (ref 78.0–100.0)
Monocytes Absolute: 0.8 10*3/uL (ref 0.1–1.0)
Monocytes Relative: 6.4 % (ref 3.0–12.0)
Neutro Abs: 8.6 10*3/uL — ABNORMAL HIGH (ref 1.4–7.7)
Neutrophils Relative %: 72.9 % (ref 43.0–77.0)
Platelets: 341 10*3/uL (ref 150.0–400.0)
RBC: 5.41 Mil/uL (ref 4.22–5.81)
RDW: 13.3 % (ref 11.5–15.5)
WBC: 11.7 10*3/uL — ABNORMAL HIGH (ref 4.0–10.5)

## 2019-02-19 NOTE — Patient Instructions (Signed)
It was great to see you!  You will be contacted about your ultrasound of your heart and wearing the heart monitor.  Take care,  Inda Coke PA-C

## 2019-02-19 NOTE — Progress Notes (Signed)
Anthony Miller is a 64 y.o. male here for a new problem.  I acted as a Education administrator for [employer], PA-C Anselmo Pickler, LPN  History of Present Illness:   Chief Complaint  Patient presents with  . Loss of Consciousness    HPI   Syncopal episodes Patient reports that over the course of recent illness he has had at least 4 syncopal episodes, all occurring after significant "coughing fits." One was witnessed by his family, and said that he was coughing extensively at the dinner table and then slumped over for about 30 seconds and then became responsive again. Pt is feeling better today and has completed antibiotic and prednisone. He is no longer having the severe coughing spells and no more episodes of syncope.  Denies: CP, SOB, DOE, LE swelling  Past Medical History:  Diagnosis Date  . Arthritis    "lower back/pelvic region" (06/21/2013)  . GERD (gastroesophageal reflux disease)   . Hiatal hernia   . Hypertension   . IBS (irritable bowel syndrome)   . Pneumonia 1970's; ~ 2010  . Status post dilation of esophageal narrowing      Social History   Socioeconomic History  . Marital status: Married    Spouse name: Anthony Miller  . Number of children: 3  . Years of education: Not on file  . Highest education level: Not on file  Occupational History  . 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 Smoking status: Never Smoker  . Smokeless tobacco: Former Systems developer    Types: Chew  . Tobacco comment: "quit chewing in the 1980's"  Substance and Sexual Activity  . Alcohol use: Yes    Alcohol/week: 2.0 standard drinks    Types: 2 Shots of liquor per week  . Drug use: No  . Sexual activity: Yes  Other Topics Concern  . Not on file  Social History Narrative   Living and working in [place], two younger daughters still at home   Older son , from previous marriage , not much contact   Licensed Comptroller, EMS, Engineer, agricultural   Social Determinants of Health   Financial Resource Strain:   . Difficulty of Paying Living Expenses: Not on file  Food Insecurity:   . Worried About Charity fundraiser in the Last Year: Not on file  . Ran Out of Food in the Last Year: Not on file  Transportation Needs:   . Lack of Transportation (Medical): Not on file  . Lack of Transportation (Non-Medical): Not on file  Physical Activity:   . Days of Exercise per Week: Not on file  . Minutes of Exercise per Session: Not on file  Stress:   . Feeling of Stress : Not on file  Social Connections:   . Frequency of Communication with Friends and Family: Not on file  . Frequency of Social Gatherings with Friends and Family: Not on file  . Attends Religious Services: Not on file  . Active Member of Clubs or Organizations: Not on file  . Attends Archivist Meetings: Not on file  . Marital Status: Not on file  Intimate Partner Violence:   . Fear of Current or Ex-Partner: Not on file  . Emotionally Abused: Not on file  . Physically Abused: Not on file  . Sexually Abused: Not on file    Past Surgical History:  Procedure Laterality Date  . LAPAROSCOPIC CHOLECYSTECTOMY  ~ 1999  . LUMBAR LAMINECTOMY/DECOMPRESSION  MICRODISCECTOMY N/A 06/26/2013   Procedure: LUMBAR LAMINECTOMY/DECOMPRESSION MICRODISCECTOMY;  Surgeon: Sinclair Ship, MD;  Location: [facility];  Service: Orthopedics;  Laterality: N/A;  . TONSILLECTOMY  1975    Family History  Problem Relation Age of Onset  . Breast cancer Mother   . Pancreatic cancer Paternal Grandmother     Allergies  Allergen Reactions  . Morphine And Related Rash    Current Medications:   Current Outpatient Medications:  .  albuterol (VENTOLIN HFA) 108 (90 Base) MCG/ACT inhaler, Inhale 2 puffs into the lungs every 6 (six) hours as needed for wheezing or shortness of breath., Disp: 18 g, Rfl: 1 .  amitriptyline (ELAVIL) 50 MG tablet, TAKE 1 TABLET BY MOUTH EVERYDAY AT  BEDTIME, Disp: 90 tablet, Rfl: 0 .  amLODipine (NORVASC) 5 MG tablet, TAKE 1 TABLET BY MOUTH EVERY DAY, Disp: 30 tablet, Rfl: 0 .  fluticasone (FLONASE) 50 MCG/ACT nasal spray, SPRAY 2 SPRAYS INTO EACH NOSTRIL EVERY DAY, Disp: 16 g, Rfl: 2 .  Omega-3 Fatty Acids (FISH OIL) 1000 MG CAPS, Take 1 capsule by mouth daily., Disp: , Rfl:  .  omeprazole (PRILOSEC) 20 MG capsule, Take 1 capsule (20 mg total) by mouth daily., Disp: 30 capsule, Rfl: 3 .  vitamin C (ASCORBIC ACID) 500 MG tablet, Take 500-1,000 mg by mouth daily., Disp: , Rfl:    Review of Systems:   ROS Negative unless otherwise specified per HPI.  Vitals:   Vitals:   02/19/19 0929  BP: 140/90  Pulse: 96  Temp: 98.1 F (36.7 C)  TempSrc: Temporal  SpO2: 97%  Weight: 257 lb (116.6 kg)  Height: 6' (1.829 m)     Body mass index is 34.86 kg/m.  Physical Exam:   Physical Exam Vitals and nursing note reviewed.  Constitutional:      General: He is not in acute distress.    Appearance: He is well-developed. He is not ill-appearing or toxic-appearing.  Cardiovascular:     Rate and Rhythm: Normal rate and regular rhythm.     Pulses: Normal pulses.     Heart sounds: Normal heart sounds, S1 normal and S2 normal.     Comments: No LE edema Pulmonary:     Effort: Pulmonary effort is normal.     Breath sounds: Normal breath sounds.  Skin:    General: Skin is warm and dry.  Neurological:     Mental Status: He is alert.     GCS: GCS eye subscore is 4. GCS verbal subscore is 5. GCS motor subscore is 6.  Psychiatric:        Speech: Speech normal.        Behavior: Behavior normal. Behavior is cooperative.      Assessment and Plan:   [place] was seen today for loss of consciousness.  Diagnoses and all orders for this visit:  Syncope, unspecified syncope type -     EKG 12-Lead -     CBC with Differential/Platelet -     Comprehensive metabolic panel -     TSH -     ECHOCARDIOGRAM COMPLETE; Future -     HOLTER MONITOR -  72 HOUR; Future   New. EKG tracing is personally reviewed.  EKG notes sinus arrhythmia; no acute changes compared to prior EKG. No red flags on discussion. Will order outpatient Holter Monitor and 2-D Echo for further evaluation. Worsening precautions advised in the interim.  . Reviewed expectations re: course of current medical issues. . Discussed self-management of symptoms. Marland Kitchen  Outlined signs and symptoms indicating need for more acute intervention. . Patient verbalized understanding and all questions were answered. . See orders for this visit as documented in the electronic medical record. . Patient received an After-Visit Summary.  CMA or LPN served as scribe during this visit. History, Physical, and Plan performed by medical provider. The above documentation has been reviewed and is accurate and complete.  Inda Coke, PA-C

## 2019-02-20 ENCOUNTER — Telehealth: Payer: Self-pay | Admitting: Radiology

## 2019-02-20 NOTE — Telephone Encounter (Signed)
Enrolled patient for a 3 day Zio monitor to be mailed. Brief instructions were gone over with the patient and he knows to expect the monitor to arrive in 5-6 days.

## 2019-02-27 ENCOUNTER — Other Ambulatory Visit (HOSPITAL_COMMUNITY): Payer: BC Managed Care – PPO

## 2019-03-04 ENCOUNTER — Telehealth: Payer: Self-pay | Admitting: Physician Assistant

## 2019-03-04 DIAGNOSIS — Z20822 Contact with and (suspected) exposure to covid-19: Secondary | ICD-10-CM | POA: Diagnosis not present

## 2019-03-04 DIAGNOSIS — Z6833 Body mass index (BMI) 33.0-33.9, adult: Secondary | ICD-10-CM | POA: Diagnosis not present

## 2019-03-04 NOTE — Telephone Encounter (Signed)
Spoke to pt said he went for a rapid COVID test this morning it was Negative. He said Friday he started cough again loss sense of taste and smell. Denies fever or chills. Asked if he was okay to wait till his visit tomorrow with Aldona Bar to discuss about more medication. Pt said that is fine. Told him okay.

## 2019-03-04 NOTE — Telephone Encounter (Signed)
ERROR

## 2019-03-04 NOTE — Telephone Encounter (Signed)
Please call and inquire what patient is wanting.

## 2019-03-04 NOTE — Telephone Encounter (Signed)
Patient called in the morning stating that he was seen awhile ago for a cough/congestion and was prescribed medication for it, he wanted to know if he could get it refilled before his appointment tomorrow.

## 2019-03-04 NOTE — Telephone Encounter (Signed)
Please see message and advise 

## 2019-03-05 ENCOUNTER — Ambulatory Visit (INDEPENDENT_AMBULATORY_CARE_PROVIDER_SITE_OTHER): Payer: BC Managed Care – PPO | Admitting: Physician Assistant

## 2019-03-05 ENCOUNTER — Encounter: Payer: Self-pay | Admitting: Physician Assistant

## 2019-03-05 VITALS — Ht 72.0 in | Wt 257.0 lb

## 2019-03-05 DIAGNOSIS — R0981 Nasal congestion: Secondary | ICD-10-CM | POA: Diagnosis not present

## 2019-03-05 MED ORDER — DOXYCYCLINE HYCLATE 100 MG PO TABS: 100.0000 mg | ORAL_TABLET | Freq: Two times a day (BID) | ORAL | 0 refills | Status: DC

## 2019-03-05 MED ORDER — PREDNISONE 20 MG PO TABS: 20.0000 mg | ORAL_TABLET | Freq: Two times a day (BID) | ORAL | 0 refills | Status: AC

## 2019-03-05 MED ORDER — FLUTICASONE PROPIONATE 50 MCG/ACT NA SUSP: 2.0000 | Freq: Every day | NASAL | 2 refills | Status: DC

## 2019-03-05 NOTE — Progress Notes (Signed)
Virtual Visit via Video   I connected with Anthony Miller on 03/05/19 at 11:20 AM EST by a video enabled telemedicine application and verified that I am speaking with the correct person using two identifiers. Location patient: Home Location provider: [facility], Office Persons participating in the virtual visit: HAIK TJARKS, Inda Coke PA-C, Anselmo Pickler, LPN   I discussed the limitations of evaluation and management by telemedicine and the availability of in person appointments. The patient expressed understanding and agreed to proceed.  I acted as a Education administrator for [employer], PA-C [employer], LPN  Subjective:   HPI:   Cough Pt c/o cough and nasal congestion started again on Friday also lost sense of taste and smell. He went for rapid COVID testing yesterday and it was Negative. Coughing and expectorating yellow sputum. Denies fever, chills, chest pain, SOB, syncope. He was treated with Prednisone and z-pack at 12/30 visit. Taking an allergy pill q 4-6 hours, ran out of flonase.  ROS: See pertinent positives and negatives per HPI.  Patient Active Problem List   Diagnosis Date Noted  . Obstructive sleep apnea syndrome 10/25/2017  . Snoring 10/25/2017  . Acute recurrent pansinusitis 10/25/2017  . Reflex apnea 10/25/2017  . 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 Lipoma of anterior chest wall 03/11/2016  . Elevated hemoglobin ([place]) 10/25/2015  . Hypertriglyceridemia 10/25/2015  . History of esophageal stricture 10/07/2015  . IBS (irritable bowel syndrome) 10/07/2015  . Dyslipidemia 10/07/2015  . Insomnia 10/07/2015  . Radiculopathy 06/26/2013  . History of back surgery 06/21/2013  . HTN (hypertension) 06/21/2013  . GERD (gastroesophageal reflux disease) 06/02/2008  . Perennial allergic rhinitis with seasonal variation 05/08/2008    Social History   Tobacco Use  . Smoking status: Never Smoker  . Smokeless tobacco: Former Systems developer    Types: Chew  . Tobacco  comment: "quit chewing in the 1980's"  Substance Use Topics  . Alcohol use: Yes    Alcohol/week: 2.0 standard drinks    Types: 2 Shots of liquor per week    Current Outpatient Medications:  .  amitriptyline (ELAVIL) 50 MG tablet, TAKE 1 TABLET BY MOUTH EVERYDAY AT BEDTIME, Disp: 90 tablet, Rfl: 0 .  amLODipine (NORVASC) 5 MG tablet, TAKE 1 TABLET BY MOUTH EVERY DAY, Disp: 30 tablet, Rfl: 0 .  fluticasone (FLONASE) 50 MCG/ACT nasal spray, SPRAY 2 SPRAYS INTO EACH NOSTRIL EVERY DAY, Disp: 16 g, Rfl: 2 .  Omega-3 Fatty Acids (FISH OIL) 1000 MG CAPS, Take 1 capsule by mouth daily., Disp: , Rfl:  .  omeprazole (PRILOSEC) 20 MG capsule, Take 1 capsule (20 mg total) by mouth daily., Disp: 30 capsule, Rfl: 3 .  vitamin C (ASCORBIC ACID) 500 MG tablet, Take 500-1,000 mg by mouth daily., Disp: , Rfl:  .  albuterol (VENTOLIN HFA) 108 (90 Base) MCG/ACT inhaler, Inhale 2 puffs into the lungs every 6 (six) hours as needed for wheezing or shortness of breath. (Patient not taking: Reported on 03/05/2019), Disp: 18 g, Rfl: 1 .  doxycycline (VIBRA-TABS) 100 MG tablet, Take 1 tablet (100 mg total) by mouth 2 (two) times daily., Disp: 20 tablet, Rfl: 0 .  fluticasone (FLONASE) 50 MCG/ACT nasal spray, Place 2 sprays into both nostrils daily., Disp: 16 g, Rfl: 2 .  predniSONE (DELTASONE) 20 MG tablet, Take 1 tablet (20 mg total) by mouth 2 (two) times daily with a meal for 5 days. Take for 7 days, Disp: 10 tablet, Rfl: 0  Allergies  Allergen Reactions  .  Morphine And Related Rash    Objective:   VITALS: Per patient if applicable, see vitals. GENERAL: Alert, appears well and in no acute distress. HEENT: Atraumatic, conjunctiva clear, no obvious abnormalities on inspection of external nose and ears. NECK: Normal movements of the head and neck. CARDIOPULMONARY: No increased WOB. Speaking in clear sentences. I:E ratio WNL.  MS: Moves all visible extremities without noticeable abnormality. PSYCH: Pleasant and  cooperative, well-groomed. Speech normal rate and rhythm. Affect is appropriate. Insight and judgement are appropriate. Attention is focused, linear, and appropriate.  NEURO: CN grossly intact. Oriented as arrived to appointment on time with no prompting. Moves both UE equally.  SKIN: No obvious lesions, wounds, erythema, or cyanosis noted on face or hands.  Assessment and Plan:   Antoin was seen today for cough.  Diagnoses and all orders for this visit:  Sinus congestion COVID-19 rapid test negative.  Suspect viral sinusitis. Will trial oral prednisone. Safety rx of doxcycline provided if fails oral prednisone treatment or if symptoms change. Worsening precautions advised. Low threshold to repeat covid-19 test if taste/smell doesn't return in next few days with symptom improvement.  Other orders -     predniSONE (DELTASONE) 20 MG tablet; Take 1 tablet (20 mg total) by mouth 2 (two) times daily with a meal for 5 days. Take for 7 days -     doxycycline (VIBRA-TABS) 100 MG tablet; Take 1 tablet (100 mg total) by mouth 2 (two) times daily. -     fluticasone (FLONASE) 50 MCG/ACT nasal spray; Place 2 sprays into both nostrils daily.  . Reviewed expectations re: course of current medical issues. . Discussed self-management of symptoms. . Outlined signs and symptoms indicating need for more acute intervention. . Patient verbalized understanding and all questions were answered. Marland Kitchen Health Maintenance issues including appropriate healthy diet, exercise, and smoking avoidance were discussed with patient. . See orders for this visit as documented in the electronic medical record.  I discussed the assessment and treatment plan with the patient. The patient was provided an opportunity to ask questions and all were answered. The patient agreed with the plan and demonstrated an understanding of the instructions.   The patient was advised to call back or seek an in-person evaluation if the symptoms worsen  or if the condition fails to improve as anticipated.   CMA or LPN served as scribe during this visit. History, Physical, and Plan performed by medical provider. The above documentation has been reviewed and is accurate and complete.   [place], [place] 03/05/2019

## 2019-03-07 ENCOUNTER — Other Ambulatory Visit: Payer: Self-pay | Admitting: Physician Assistant

## 2019-03-08 ENCOUNTER — Encounter (HOSPITAL_COMMUNITY): Payer: Self-pay | Admitting: Radiology

## 2019-03-19 ENCOUNTER — Telehealth (HOSPITAL_COMMUNITY): Payer: Self-pay | Admitting: Radiology

## 2019-03-19 NOTE — Telephone Encounter (Signed)
Just an FYI. We have made several attempts to contact this patient including sending a letter to schedule or reschedule their echocardiogram. We will be removing the patient from the echo WQ.  1.21.21@1507  will mail letter evd  1.14.21 spoke with patient will cb next week to reschedule evd  1.13.21 No show -due to workload. Stated he might not make it due to workload. evd Thank you

## 2019-04-12 DIAGNOSIS — M67911 Unspecified disorder of synovium and tendon, right shoulder: Secondary | ICD-10-CM | POA: Diagnosis not present

## 2019-04-12 DIAGNOSIS — M4722 Other spondylosis with radiculopathy, cervical region: Secondary | ICD-10-CM | POA: Diagnosis not present

## 2019-05-10 DIAGNOSIS — M5412 Radiculopathy, cervical region: Secondary | ICD-10-CM | POA: Diagnosis not present

## 2019-05-10 DIAGNOSIS — M6281 Muscle weakness (generalized): Secondary | ICD-10-CM | POA: Diagnosis not present

## 2019-05-24 NOTE — Telephone Encounter (Signed)
Cancelled order.. Monitor marked as being lost

## 2019-06-04 ENCOUNTER — Encounter: Payer: Self-pay | Admitting: Physician Assistant

## 2019-06-04 ENCOUNTER — Telehealth (INDEPENDENT_AMBULATORY_CARE_PROVIDER_SITE_OTHER): Payer: BC Managed Care – PPO | Admitting: Physician Assistant

## 2019-06-04 VITALS — Ht 72.0 in | Wt 240.0 lb

## 2019-06-04 DIAGNOSIS — R0981 Nasal congestion: Secondary | ICD-10-CM

## 2019-06-04 MED ORDER — AZITHROMYCIN 250 MG PO TABS: ORAL_TABLET | ORAL | 0 refills | Status: DC

## 2019-06-04 MED ORDER — PREDNISONE 20 MG PO TABS: 40.0000 mg | ORAL_TABLET | Freq: Every day | ORAL | 0 refills | Status: DC

## 2019-06-04 MED ORDER — HYDROCOD POLST-CPM POLST ER 10-8 MG/5ML PO SUER: 5.0000 mL | Freq: Every evening | ORAL | 0 refills | Status: DC | PRN

## 2019-06-04 NOTE — Progress Notes (Signed)
Virtual Visit via Video   I connected with Anthony Miller on 06/04/19 at 11:30 AM EDT by a video enabled telemedicine application and verified that I am speaking with the correct person using two identifiers. Location patient: Home Location provider: [facility], Office Persons participating in the virtual visit: Anthony Miller, Anthony Coke PA-C, Anthony Pickler, LPN   I discussed the limitations of evaluation and management by telemedicine and the availability of in person appointments. The patient expressed understanding and agreed to proceed.  I acted as a Education administrator for [employer], PA-C [employer], LPN  Subjective:   HPI:   Sinus problem Pt c/o coughing and expectorating yellow sputum, sneezing, chest congestion. Denies fever, chest pain, unusual body aches, SOB or chills. History of recurrent pansinusitis. Denies COVID-19 exposures.  ROS: See pertinent positives and negatives per HPI.  Patient Active Problem List   Diagnosis Date Noted  . Obstructive sleep apnea syndrome 10/25/2017  . Snoring 10/25/2017  . Acute recurrent pansinusitis 10/25/2017  . Reflex apnea 10/25/2017  . 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 Lipoma of anterior chest wall 03/11/2016  . Elevated hemoglobin ([place]) 10/25/2015  . Hypertriglyceridemia 10/25/2015  . History of esophageal stricture 10/07/2015  . IBS (irritable bowel syndrome) 10/07/2015  . Dyslipidemia 10/07/2015  . Insomnia 10/07/2015  . Radiculopathy 06/26/2013  . History of back surgery 06/21/2013  . HTN (hypertension) 06/21/2013  . GERD (gastroesophageal reflux disease) 06/02/2008  . Perennial allergic rhinitis with seasonal variation 05/08/2008    Social History   Tobacco Use  . Smoking status: Never Smoker  . Smokeless tobacco: Former Systems developer    Types: Chew  . Tobacco comment: "quit chewing in the 1980's"  Substance Use Topics  . Alcohol use: Yes    Alcohol/week: 2.0 standard drinks    Types: 2 Shots of liquor per  week    Current Outpatient Medications:  .  albuterol (VENTOLIN HFA) 108 (90 Base) MCG/ACT inhaler, Inhale 2 puffs into the lungs every 6 (six) hours as needed for wheezing or shortness of breath., Disp: 18 g, Rfl: 1 .  amitriptyline (ELAVIL) 50 MG tablet, TAKE 1 TABLET BY MOUTH EVERYDAY AT BEDTIME, Disp: 90 tablet, Rfl: 0 .  amLODipine (NORVASC) 5 MG tablet, TAKE 1 TABLET BY MOUTH EVERY DAY, Disp: 30 tablet, Rfl: 2 .  fluticasone (FLONASE) 50 MCG/ACT nasal spray, Place 2 sprays into both nostrils daily., Disp: 16 g, Rfl: 2 .  omeprazole (PRILOSEC) 20 MG capsule, Take 1 capsule (20 mg total) by mouth daily., Disp: 30 capsule, Rfl: 3 .  vitamin C (ASCORBIC ACID) 500 MG tablet, Take 500-1,000 mg by mouth daily., Disp: , Rfl:  .  azithromycin (ZITHROMAX Z-PAK) 250 MG tablet, Take 2 tablets ( total of 500 mg) PO on day 1, then 1 tablet ( total of 250 mg) PO q24 x 4 days., Disp: 6 each, Rfl: 0 .  chlorpheniramine-HYDROcodone (TUSSIONEX PENNKINETIC ER) 10-8 MG/5ML SUER, Take 5 mLs by mouth at bedtime as needed for cough., Disp: 60 mL, Rfl: 0 .  predniSONE (DELTASONE) 20 MG tablet, Take 2 tablets (40 mg total) by mouth daily., Disp: 10 tablet, Rfl: 0  Allergies  Allergen Reactions  . Morphine And Related Rash    Objective:   VITALS: Per patient if applicable, see vitals. GENERAL: Alert, appears well and in no acute distress. HEENT: Atraumatic, conjunctiva clear, no obvious abnormalities on inspection of external nose and ears. NECK: Normal movements of the head and neck. CARDIOPULMONARY: No increased WOB. Speaking  in clear sentences. I:E ratio WNL.  MS: Moves all visible extremities without noticeable abnormality. PSYCH: Pleasant and cooperative, well-groomed. Speech normal rate and rhythm. Affect is appropriate. Insight and judgement are appropriate. Attention is focused, linear, and appropriate.  NEURO: CN grossly intact. Oriented as arrived to appointment on time with no prompting. Moves both  UE equally.  SKIN: No obvious lesions, wounds, erythema, or cyanosis noted on face or hands.  Assessment and Plan:   Anthony Miller was seen today for sinus problem.  Diagnoses and all orders for this visit:  Sinus congestion  Other orders -     azithromycin (ZITHROMAX Z-PAK) 250 MG tablet; Take 2 tablets ( total of 500 mg) PO on day 1, then 1 tablet ( total of 250 mg) PO q24 x 4 days. -     predniSONE (DELTASONE) 20 MG tablet; Take 2 tablets (40 mg total) by mouth daily. -     chlorpheniramine-HYDROcodone (TUSSIONEX PENNKINETIC ER) 10-8 MG/5ML SUER; Take 5 mLs by mouth at bedtime as needed for cough.   No red flags on discussion.  Patient is having a presentation of his typical sinusitis with possible bronchitis.  We will start oral azithromycin, oral prednisone and Tussionex as needed.  Recommend follow-up if symptoms do not improve as anticipated or further concerns.  . Reviewed expectations re: course of current medical issues. . Discussed self-management of symptoms. . Outlined signs and symptoms indicating need for more acute intervention. . Patient verbalized understanding and all questions were answered. Marland Kitchen Health Maintenance issues including appropriate healthy diet, exercise, and smoking avoidance were discussed with patient. . See orders for this visit as documented in the electronic medical record.  I discussed the assessment and treatment plan with the patient. The patient was provided an opportunity to ask questions and all were answered. The patient agreed with the plan and demonstrated an understanding of the instructions.   The patient was advised to call back or seek an in-person evaluation if the symptoms worsen or if the condition fails to improve as anticipated.   CMA or LPN served as scribe during this visit. History, Physical, and Plan performed by medical provider. The above documentation has been reviewed and is accurate and complete.  [place],  [place] 06/04/2019

## 2019-06-08 ENCOUNTER — Other Ambulatory Visit: Payer: Self-pay | Admitting: Physician Assistant

## 2019-07-02 ENCOUNTER — Other Ambulatory Visit: Payer: Self-pay

## 2019-07-02 ENCOUNTER — Telehealth (INDEPENDENT_AMBULATORY_CARE_PROVIDER_SITE_OTHER): Payer: BC Managed Care – PPO | Admitting: Physician Assistant

## 2019-07-02 ENCOUNTER — Encounter: Payer: Self-pay | Admitting: Physician Assistant

## 2019-07-02 DIAGNOSIS — J0141 Acute recurrent pansinusitis: Secondary | ICD-10-CM | POA: Diagnosis not present

## 2019-07-02 MED ORDER — PREDNISONE 20 MG PO TABS: 40.0000 mg | ORAL_TABLET | Freq: Every day | ORAL | 0 refills | Status: DC

## 2019-07-02 MED ORDER — HYDROCOD POLST-CPM POLST ER 10-8 MG/5ML PO SUER: 5.0000 mL | Freq: Every evening | ORAL | 0 refills | Status: DC | PRN

## 2019-07-02 MED ORDER — AZITHROMYCIN 250 MG PO TABS: ORAL_TABLET | ORAL | 0 refills | Status: DC

## 2019-07-02 NOTE — Progress Notes (Signed)
Virtual Visit via Video   I connected with Anthony Miller on 07/02/19 at  7:30 AM EDT by a video enabled telemedicine application and verified that I am speaking with the correct person using two identifiers. Location patient: Home Location provider: [facility], Office Persons participating in the virtual visit: RANIER VANDERWOUDE, Inda Coke PA-C, Anthony Pickler, LPN   I discussed the limitations of evaluation and management by telemedicine and the availability of in person appointments. The patient expressed understanding and agreed to proceed.  I acted as a Education administrator for [employer], [employer], LPN   Subjective:   HPI:   Cough Pt c/o coughing and expectorating green/yellow sputum, and  sneezing due to allergies. Started 2 weeks ago. Denies fever, chills, chest pain, SOB. Using Albuterol Inhaler as needed. Recently moved his daughter out of her sorority house and states that there "a lot of sickness" there.    ROS: See pertinent positives and negatives per HPI.  Patient Active Problem List   Diagnosis Date Noted  . Obstructive sleep apnea syndrome 10/25/2017  . Snoring 10/25/2017  . Acute recurrent pansinusitis 10/25/2017  . Reflex apnea 10/25/2017  . 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 Lipoma of anterior chest wall 03/11/2016  . Elevated hemoglobin ([place]) 10/25/2015  . Hypertriglyceridemia 10/25/2015  . History of esophageal stricture 10/07/2015  . IBS (irritable bowel syndrome) 10/07/2015  . Dyslipidemia 10/07/2015  . Insomnia 10/07/2015  . Radiculopathy 06/26/2013  . History of back surgery 06/21/2013  . HTN (hypertension) 06/21/2013  . GERD (gastroesophageal reflux disease) 06/02/2008  . Perennial allergic rhinitis with seasonal variation 05/08/2008    Social History   Tobacco Use  . Smoking status: Never Smoker  . Smokeless tobacco: Former Systems developer    Types: Chew  . Tobacco comment: "quit chewing in the 1980's"  Substance Use Topics  . Alcohol  use: Yes    Alcohol/week: 2.0 standard drinks    Types: 2 Shots of liquor per week    Current Outpatient Medications:  .  albuterol (VENTOLIN HFA) 108 (90 Base) MCG/ACT inhaler, Inhale 2 puffs into the lungs every 6 (six) hours as needed for wheezing or shortness of breath., Disp: 18 g, Rfl: 1 .  amitriptyline (ELAVIL) 50 MG tablet, TAKE 1 TABLET BY MOUTH EVERYDAY AT BEDTIME, Disp: 30 tablet, Rfl: 2 .  amLODipine (NORVASC) 5 MG tablet, TAKE 1 TABLET BY MOUTH EVERY DAY, Disp: 30 tablet, Rfl: 2 .  chlorpheniramine-HYDROcodone (TUSSIONEX PENNKINETIC ER) 10-8 MG/5ML SUER, Take 5 mLs by mouth at bedtime as needed for cough., Disp: 60 mL, Rfl: 0 .  omeprazole (PRILOSEC) 20 MG capsule, Take 1 capsule (20 mg total) by mouth daily., Disp: 30 capsule, Rfl: 3 .  azithromycin (ZITHROMAX) 250 MG tablet, Take two tabs on day 1, then one daily x 4 days, Disp: 6 tablet, Rfl: 0 .  fluticasone (FLONASE) 50 MCG/ACT nasal spray, Place 2 sprays into both nostrils daily. (Patient not taking: Reported on 07/02/2019), Disp: 16 g, Rfl: 2 .  predniSONE (DELTASONE) 20 MG tablet, Take 2 tablets (40 mg total) by mouth daily., Disp: 10 tablet, Rfl: 0  Allergies  Allergen Reactions  . Morphine And Related Rash    Objective:   VITALS: Per patient if applicable, see vitals. GENERAL: Alert, appears well and in no acute distress. HEENT: Atraumatic, conjunctiva clear, no obvious abnormalities on inspection of external nose and ears. NECK: Normal movements of the head and neck. CARDIOPULMONARY: No increased WOB. Speaking in clear sentences. I:E ratio  WNL.  MS: Moves all visible extremities without noticeable abnormality. PSYCH: Pleasant and cooperative, well-groomed. Speech normal rate and rhythm. Affect is appropriate. Insight and judgement are appropriate. Attention is focused, linear, and appropriate.  NEURO: CN grossly intact. Oriented as arrived to appointment on time with no prompting. Moves both UE equally.  SKIN: No  obvious lesions, wounds, erythema, or cyanosis noted on face or hands.  Assessment and Plan:   Lawarence was seen today for cough.  Diagnoses and all orders for this visit:  Acute recurrent pansinusitis  Other orders -     predniSONE (DELTASONE) 20 MG tablet; Take 2 tablets (40 mg total) by mouth daily. -     azithromycin (ZITHROMAX) 250 MG tablet; Take two tabs on day 1, then one daily x 4 days -     chlorpheniramine-HYDROcodone (TUSSIONEX PENNKINETIC ER) 10-8 MG/5ML SUER; Take 5 mLs by mouth at bedtime as needed for cough.   No red flags on exam.  Will initiate oral prednisone, azithromycin, and oral tussionex per orders. Discussed taking medications as prescribed. Reviewed return precautions including worsening fever, SOB, worsening cough or other concerns. Push fluids and rest. I recommend that patient follow-up if symptoms worsen or persist despite treatment x 7-10 days, sooner if needed.  . Reviewed expectations re: course of current medical issues. . Discussed self-management of symptoms. . Outlined signs and symptoms indicating need for more acute intervention. . Patient verbalized understanding and all questions were answered. Marland Kitchen Health Maintenance issues including appropriate healthy diet, exercise, and smoking avoidance were discussed with patient. . See orders for this visit as documented in the electronic medical record.  I discussed the assessment and treatment plan with the patient. The patient was provided an opportunity to ask questions and all were answered. The patient agreed with the plan and demonstrated an understanding of the instructions.   The patient was advised to call back or seek an in-person evaluation if the symptoms worsen or if the condition fails to improve as anticipated.   CMA or LPN served as scribe during this visit. History, Physical, and Plan performed by medical provider. The above documentation has been reviewed and is accurate and  complete.   [place], [place] 07/02/2019

## 2019-07-14 ENCOUNTER — Other Ambulatory Visit: Payer: Self-pay | Admitting: Physician Assistant

## 2019-07-17 ENCOUNTER — Telehealth: Payer: Self-pay | Admitting: Physician Assistant

## 2019-07-17 NOTE — Telephone Encounter (Signed)
Patient is calling in this morning and states that he has been seen within the last month for a productive cough/muscus. Says the medication helps but once he finishes it, it comes back and he would like to know if he could get an appointment in office to see Sam.

## 2019-07-17 NOTE — Telephone Encounter (Signed)
Patient states he will be away from his phone about 11, asked if he could be left a voicemail.

## 2019-07-17 NOTE — Telephone Encounter (Signed)
Spoke to pt told him per [facility] Unfortunately will need to be COVID tested prior to coming into office given ongoing issues.After testing, we can see him with proof of negative test. Alternatively, I can refer to pulmonary. Pt verbalized understanding and will go have COVID testing done tonight. Told him okay once you have result and proof I can put you on the schedule for Friday. Pt verbalized understanding.

## 2019-07-17 NOTE — Telephone Encounter (Signed)
Unfortunately will need to be COVID tested prior to coming into office given ongoing issues.  After testing, we can see him.  Alternatively, I can refer to pulmonary.

## 2019-07-17 NOTE — Telephone Encounter (Signed)
Please see message and advise 

## 2019-07-18 DIAGNOSIS — J302 Other seasonal allergic rhinitis: Secondary | ICD-10-CM | POA: Diagnosis not present

## 2019-07-18 DIAGNOSIS — Z20822 Contact with and (suspected) exposure to covid-19: Secondary | ICD-10-CM | POA: Diagnosis not present

## 2019-07-18 DIAGNOSIS — R05 Cough: Secondary | ICD-10-CM | POA: Diagnosis not present

## 2019-07-18 DIAGNOSIS — Z6831 Body mass index (BMI) 31.0-31.9, adult: Secondary | ICD-10-CM | POA: Diagnosis not present

## 2019-07-18 LAB — NOVEL CORONAVIRUS, NAA: SARS-CoV-2, NAA: NEGATIVE

## 2019-07-19 ENCOUNTER — Ambulatory Visit (INDEPENDENT_AMBULATORY_CARE_PROVIDER_SITE_OTHER): Payer: BC Managed Care – PPO | Admitting: Physician Assistant

## 2019-07-19 ENCOUNTER — Other Ambulatory Visit: Payer: Self-pay

## 2019-07-19 ENCOUNTER — Encounter: Payer: Self-pay | Admitting: Physician Assistant

## 2019-07-19 ENCOUNTER — Ambulatory Visit (INDEPENDENT_AMBULATORY_CARE_PROVIDER_SITE_OTHER): Payer: BC Managed Care – PPO

## 2019-07-19 VITALS — BP 140/90 | HR 83 | Temp 98.4°F | Ht 72.0 in | Wt 250.2 lb

## 2019-07-19 DIAGNOSIS — R05 Cough: Secondary | ICD-10-CM

## 2019-07-19 DIAGNOSIS — R059 Cough, unspecified: Secondary | ICD-10-CM

## 2019-07-19 DIAGNOSIS — J0141 Acute recurrent pansinusitis: Secondary | ICD-10-CM | POA: Diagnosis not present

## 2019-07-19 MED ORDER — METHYLPREDNISOLONE ACETATE 80 MG/ML IJ SUSP
80.0000 mg | Freq: Once | INTRAMUSCULAR | Status: AC
  Administered 2019-07-19: 80 mg via INTRAMUSCULAR

## 2019-07-19 MED ORDER — MONTELUKAST SODIUM 10 MG PO TABS
10.0000 mg | ORAL_TABLET | Freq: Every day | ORAL | 3 refills | Status: DC
Start: 2019-07-19 — End: 2019-12-06

## 2019-07-19 MED ORDER — ADVAIR HFA 115-21 MCG/ACT IN AERO: 2.0000 | INHALATION_SPRAY | Freq: Two times a day (BID) | RESPIRATORY_TRACT | 6 refills | Status: DC

## 2019-07-19 NOTE — Progress Notes (Signed)
Anthony Miller is a 64 y.o. male here for a follow up of a pre-existing problem.  I acted as a Education administrator for [employer], PA-C Anthony Pickler, LPN   History of Present Illness:   Chief Complaint  Patient presents with  . Sinus Problem  . Cough  . Nasal Congestion    HPI   Sinus issues Pt c/o ongoing sinus issues, cough and expectorating yellow/green sputum, nasal congestion with yellow/green. Continues to have ongoing coughing spells. Sinuses never feel truly open.  Deneis fever, chills, chest pain. Pt had Negative COVID test done 07/18/2019. Pt was treated two weeks ago with Prednisone and Zpack worked good but symptoms came back two days later.  He has been treated for 4 sinus infections in the past 6 months.      Past Medical History:  Diagnosis Date  . Arthritis    "lower back/pelvic region" (06/21/2013)  . GERD (gastroesophageal reflux disease)   . Hiatal hernia   . Hypertension   . IBS (irritable bowel syndrome)   . Pneumonia 1970's; ~ 2010  . Status post dilation of esophageal narrowing      Social History   Tobacco Use  . Smoking status: Never Smoker  . Smokeless tobacco: Former Systems developer    Types: Chew  . Tobacco comment: "quit chewing in the 1980's"  Substance Use Topics  . Alcohol use: Yes    Alcohol/week: 2.0 standard drinks    Types: 2 Shots of liquor per week  . Drug use: No    Past Surgical History:  Procedure Laterality Date  . LAPAROSCOPIC CHOLECYSTECTOMY  ~ 1999  . LUMBAR LAMINECTOMY/DECOMPRESSION MICRODISCECTOMY N/A 06/26/2013   Procedure: LUMBAR LAMINECTOMY/DECOMPRESSION MICRODISCECTOMY;  Surgeon: Anthony Ship, MD;  Location: [facility];  Service: Orthopedics;  Laterality: N/A;  . TONSILLECTOMY  1975    Family History  Problem Relation Age of Onset  . Breast cancer Mother   . Pancreatic cancer Paternal Grandmother     Allergies  Allergen Reactions  . Morphine And Related Rash    Current Medications:   Current Outpatient  Medications:  .  albuterol (VENTOLIN HFA) 108 (90 Base) MCG/ACT inhaler, Inhale 2 puffs into the lungs every 6 (six) hours as needed for wheezing or shortness of breath., Disp: 18 g, Rfl: 1 .  amitriptyline (ELAVIL) 50 MG tablet, TAKE 1 TABLET BY MOUTH EVERYDAY AT BEDTIME, Disp: 90 tablet, Rfl: 1 .  amLODipine (NORVASC) 5 MG tablet, TAKE 1 TABLET BY MOUTH EVERY DAY, Disp: 30 tablet, Rfl: 2 .  chlorpheniramine-HYDROcodone (TUSSIONEX PENNKINETIC ER) 10-8 MG/5ML SUER, Take 5 mLs by mouth at bedtime as needed for cough., Disp: 60 mL, Rfl: 0 .  fluticasone (FLONASE) 50 MCG/ACT nasal spray, Place 2 sprays into both nostrils daily., Disp: 16 g, Rfl: 2 .  omeprazole (PRILOSEC) 20 MG capsule, Take 1 capsule (20 mg total) by mouth daily., Disp: 30 capsule, Rfl: 3 .  fluticasone-salmeterol (ADVAIR HFA) 115-21 MCG/ACT inhaler, Inhale 2 puffs into the lungs 2 (two) times daily., Disp: 1 Inhaler, Rfl: 6 .  montelukast (SINGULAIR) 10 MG tablet, Take 1 tablet (10 mg total) by mouth at bedtime., Disp: 30 tablet, Rfl: 3   Review of Systems:   ROS  Negative unless otherwise specified per HPI.  Vitals:   Vitals:   07/19/19 0959  BP: 140/90  Pulse: 83  Temp: 98.4 F (36.9 C)  TempSrc: Temporal  SpO2: 96%  Weight: 250 lb 4 oz (113.5 kg)  Height: 6' (1.829 m)  Body mass index is 33.94 kg/m.  Physical Exam:   Physical Exam Vitals and nursing note reviewed.  Constitutional:      General: He is not in acute distress.    Appearance: He is well-developed. He is not ill-appearing or toxic-appearing.  HENT:     Nose: Mucosal edema and congestion present.     Right Sinus: Maxillary sinus tenderness present.     Left Sinus: Maxillary sinus tenderness present.  Cardiovascular:     Rate and Rhythm: Normal rate and regular rhythm.     Pulses: Normal pulses.     Heart sounds: Normal heart sounds, S1 normal and S2 normal.     Comments: No LE edema Pulmonary:     Effort: Pulmonary effort is normal.      Breath sounds: Transmitted upper airway sounds present. Rhonchi present.  Skin:    General: Skin is warm and dry.  Neurological:     Mental Status: He is alert.     GCS: GCS eye subscore is 4. GCS verbal subscore is 5. GCS motor subscore is 6.  Psychiatric:        Speech: Speech normal.        Behavior: Behavior normal. Behavior is cooperative.     Assessment and Plan:   Anthony Miller was seen today for sinus problem, cough and nasal congestion.  Diagnoses and all orders for this visit:  Cough; Recurrent pansinusitis Xray pending -- awaiting results. Discussed pulm vs ENT referral. We will send to ENT for further evaluation with low threshold to send to pulmonary if needed. He is also due for a sleep study and he agrees to get this scheduled. Will start daily advair and singulair. Continue antihistamines. Steroid injection provided today. -     DG Chest 2 View; Future -     Ambulatory referral to ENT  Other orders -     montelukast (SINGULAIR) 10 MG tablet; Take 1 tablet (10 mg total) by mouth at bedtime. -     fluticasone-salmeterol (ADVAIR HFA) 115-21 MCG/ACT inhaler; Inhale 2 puffs into the lungs 2 (two) times daily. -     Cancel: Ambulatory referral to Sleep Studies   . Reviewed expectations re: course of current medical issues. . Discussed self-management of symptoms. . Outlined signs and symptoms indicating need for more acute intervention. . Patient verbalized understanding and all questions were answered. . See orders for this visit as documented in the electronic medical record. . Patient received an After-Visit Summary.  CMA or LPN served as scribe during this visit. History, Physical, and Plan performed by medical provider. The above documentation has been reviewed and is accurate and complete.  Anthony Coke, PA-C

## 2019-07-19 NOTE — Patient Instructions (Addendum)
It was great to see you!  Referral to ENT -- you will be contacted about this. Let me know how this goes and what they recommend. Next step would be pulmonary if they think that is necessary.  [phone] -- [facility] -- you have already seen Dr. Brett Fairy for this -- please call and see if they can schedule your test.  Start Advair twice a day and singulair daily.  Consider switching to Xyzal if no significant difference with your claritin/allegra combo.  Take care,  Inda Coke PA-C

## 2019-09-27 DIAGNOSIS — M5412 Radiculopathy, cervical region: Secondary | ICD-10-CM | POA: Diagnosis not present

## 2019-10-02 DIAGNOSIS — M542 Cervicalgia: Secondary | ICD-10-CM | POA: Diagnosis not present

## 2019-10-11 DIAGNOSIS — M5412 Radiculopathy, cervical region: Secondary | ICD-10-CM | POA: Diagnosis not present

## 2019-11-06 ENCOUNTER — Other Ambulatory Visit: Payer: Self-pay | Admitting: Physician Assistant

## 2019-12-06 ENCOUNTER — Other Ambulatory Visit: Payer: Self-pay | Admitting: Physician Assistant

## 2020-02-06 ENCOUNTER — Other Ambulatory Visit: Payer: Self-pay | Admitting: Physician Assistant

## 2020-03-12 ENCOUNTER — Other Ambulatory Visit: Payer: Self-pay | Admitting: Physician Assistant

## 2020-05-22 ENCOUNTER — Other Ambulatory Visit: Payer: Self-pay | Admitting: Physician Assistant

## 2020-06-25 ENCOUNTER — Other Ambulatory Visit: Payer: Self-pay | Admitting: Physician Assistant

## 2020-06-25 NOTE — Telephone Encounter (Signed)
Left message to return call to our office at their convenience. To schedule appointment for refills

## 2020-07-06 DIAGNOSIS — B269 Mumps without complication: Secondary | ICD-10-CM | POA: Diagnosis not present

## 2020-07-10 DIAGNOSIS — L814 Other melanin hyperpigmentation: Secondary | ICD-10-CM | POA: Diagnosis not present

## 2020-07-10 DIAGNOSIS — D225 Melanocytic nevi of trunk: Secondary | ICD-10-CM | POA: Diagnosis not present

## 2020-07-10 DIAGNOSIS — L538 Other specified erythematous conditions: Secondary | ICD-10-CM | POA: Diagnosis not present

## 2020-07-10 DIAGNOSIS — L821 Other seborrheic keratosis: Secondary | ICD-10-CM | POA: Diagnosis not present

## 2020-07-10 DIAGNOSIS — L919 Hypertrophic disorder of the skin, unspecified: Secondary | ICD-10-CM | POA: Diagnosis not present

## 2020-07-29 ENCOUNTER — Other Ambulatory Visit: Payer: Self-pay | Admitting: Family Medicine

## 2020-08-11 ENCOUNTER — Telehealth: Payer: Self-pay | Admitting: Family Medicine

## 2020-08-12 ENCOUNTER — Other Ambulatory Visit: Payer: Self-pay | Admitting: Family Medicine

## 2020-08-12 MED ORDER — AMLODIPINE BESYLATE 5 MG PO TABS
5.0000 mg | ORAL_TABLET | Freq: Every day | ORAL | 0 refills | Status: DC
Start: 2020-08-12 — End: 2020-08-20

## 2020-08-12 NOTE — Addendum Note (Signed)
Addended by: Marian Sorrow on: 08/12/2020 03:51 PM   Modules accepted: Orders

## 2020-08-12 NOTE — Telephone Encounter (Signed)
Rx sent to pharmacy   

## 2020-08-12 NOTE — Telephone Encounter (Signed)
Patient is schedule for appt on 08/20/20 and would like a least a week in to make it to his appt.

## 2020-08-13 DIAGNOSIS — U071 COVID-19: Secondary | ICD-10-CM | POA: Diagnosis not present

## 2020-08-20 ENCOUNTER — Telehealth (INDEPENDENT_AMBULATORY_CARE_PROVIDER_SITE_OTHER): Payer: BC Managed Care – PPO | Admitting: Family Medicine

## 2020-08-20 DIAGNOSIS — I10 Essential (primary) hypertension: Secondary | ICD-10-CM

## 2020-08-20 DIAGNOSIS — J3089 Other allergic rhinitis: Secondary | ICD-10-CM | POA: Diagnosis not present

## 2020-08-20 DIAGNOSIS — G4700 Insomnia, unspecified: Secondary | ICD-10-CM

## 2020-08-20 DIAGNOSIS — J302 Other seasonal allergic rhinitis: Secondary | ICD-10-CM

## 2020-08-20 MED ORDER — PREDNISONE 50 MG PO TABS: ORAL_TABLET | ORAL | 0 refills | Status: DC

## 2020-08-20 MED ORDER — AZELASTINE HCL 0.1 % NA SOLN: 2.0000 | Freq: Two times a day (BID) | NASAL | 12 refills | Status: DC

## 2020-08-20 MED ORDER — AMLODIPINE BESYLATE 5 MG PO TABS: 5.0000 mg | ORAL_TABLET | Freq: Every day | ORAL | 1 refills | Status: DC

## 2020-08-20 MED ORDER — AMITRIPTYLINE HCL 50 MG PO TABS: 50.0000 mg | ORAL_TABLET | Freq: Every day | ORAL | 1 refills | Status: DC

## 2020-08-20 NOTE — Assessment & Plan Note (Signed)
Stable.  Amitriptyline 50 mg nightly refilled.

## 2020-08-20 NOTE — Assessment & Plan Note (Signed)
Worsened due to recent COVID infection.  We will add on Astelin nasal spray.  Also send in pocket prescription of prednisone with instruction not start unless symptoms do not improve with Astelin.  He will continue his Flonase and Singulair.

## 2020-08-20 NOTE — Progress Notes (Signed)
   Anthony Miller is a 65 y.o. male who presents today for a virtual office visit.  Assessment/Plan:  Chronic Problems Addressed Today: Insomnia Stable.  Amitriptyline 50 mg nightly refilled.  HTN (hypertension) Stable on amlodipine 5 mg daily.  Will refill today.  Perennial allergic rhinitis with seasonal variation Worsened due to recent COVID infection.  We will add on Astelin nasal spray.  Also send in pocket prescription of prednisone with instruction not start unless symptoms do not improve with Astelin.  He will continue his Flonase and Singulair.     Subjective:  HPI:  See A/P for status of chronic conditions.  Had COVID last week.  Still having some lingering nasal congestion.  Has a history of allergic rhinitis which is also flared up.  Otherwise no symptoms.  Breathing normally.  No chest pain or shortness of breath.         Objective/Observations  Physical Exam: Gen: NAD, resting comfortably Pulm: Normal work of breathing Neuro: Grossly normal, moves all extremities Psych: Normal affect and thought content  Virtual Visit via Video   I connected with Frederik Schmidt on 08/20/20 at  4:00 PM EDT by a video enabled telemedicine application and verified that I am speaking with the correct person using two identifiers. The limitations of evaluation and management by telemedicine and the availability of in person appointments were discussed. The patient expressed understanding and agreed to proceed.   Patient location: Home Provider location: [place] participating in the virtual visit: Myself and Patient     Algis Greenhouse. Jerline Pain, MD 08/20/2020 4:12 PM

## 2020-08-20 NOTE — Assessment & Plan Note (Signed)
Stable on amlodipine 5 mg daily.  Will refill today.

## 2020-09-03 ENCOUNTER — Other Ambulatory Visit: Payer: Self-pay | Admitting: Family Medicine

## 2020-11-23 ENCOUNTER — Other Ambulatory Visit: Payer: Self-pay | Admitting: Physician Assistant

## 2020-12-17 ENCOUNTER — Other Ambulatory Visit: Payer: Self-pay | Admitting: Physician Assistant

## 2021-01-11 ENCOUNTER — Other Ambulatory Visit: Payer: Self-pay

## 2021-01-11 ENCOUNTER — Ambulatory Visit (INDEPENDENT_AMBULATORY_CARE_PROVIDER_SITE_OTHER): Payer: BC Managed Care – PPO | Admitting: Physician Assistant

## 2021-01-11 ENCOUNTER — Encounter: Payer: Self-pay | Admitting: Physician Assistant

## 2021-01-11 VITALS — BP 142/90 | HR 78 | Temp 98.3°F | Ht 72.0 in | Wt 263.0 lb

## 2021-01-11 DIAGNOSIS — R051 Acute cough: Secondary | ICD-10-CM | POA: Diagnosis not present

## 2021-01-11 DIAGNOSIS — Z23 Encounter for immunization: Secondary | ICD-10-CM

## 2021-01-11 MED ORDER — METHYLPREDNISOLONE ACETATE 80 MG/ML IJ SUSP
80.0000 mg | Freq: Once | INTRAMUSCULAR | Status: AC
  Administered 2021-01-11: 13:00:00 80 mg via INTRAMUSCULAR

## 2021-01-11 NOTE — Addendum Note (Signed)
Addended by: Marian Sorrow on: 01/11/2021 01:46 PM   Modules accepted: Orders

## 2021-01-11 NOTE — Patient Instructions (Signed)
It was great to see you!  You have a viral upper respiratory infection. Antibiotics are not needed for this.  Viral infections usually take 7-10 days to resolve.  The cough can last a few weeks to go away.  Depo-medrol injection given today  Push fluids and get plenty of rest. Please return if you are not improving as expected, or if you have high fevers (>101.5) or difficulty swallowing or worsening productive cough.  Call clinic with questions.  I hope you start feeling better soon!

## 2021-01-11 NOTE — Progress Notes (Signed)
Anthony Miller is a 65 y.o. male here for nasal congestion.  History of Present Illness:   Chief Complaint  Patient presents with   Nasal Congestion    Pt said it started 2 weeks ago, post nasal drip, cough. He is having clear nasal drainage.    HPI  Nasal Congestion Richardson Landry presents with c/o nasal congestion that has been onset for two weeks. He also reports having post nasal drip, clear nasal drainage, and a cough for the same amount of time. Tashan has been seen about this issue in the past before on multiple occassions, all believed to be linked to the change in the weather. Currently he believes current sx are due to change in weather and that his wife was sick with a common cold shortly before his sx began. Admits he is not as hydrated as he should be but he is working on increasing water intake. Denies fever or chills, SOB, chest pain.   Past Medical History:  Diagnosis Date   Arthritis    "lower back/pelvic region" (06/21/2013)   GERD (gastroesophageal reflux disease)    Hiatal hernia    Hypertension    IBS (irritable bowel syndrome)    Pneumonia 1970's; ~ 2010   Status post dilation of esophageal narrowing      Social History   Tobacco Use   Smoking status: Never   Smokeless tobacco: Former    Types: Chew    Quit date: 10/20/1983   Tobacco comments:    "quit chewing in the 1980's"  Substance Use Topics   Alcohol use: Yes    Alcohol/week: 2.0 standard drinks    Types: 2 Shots of liquor per week   Drug use: No    Past Surgical History:  Procedure Laterality Date   LAPAROSCOPIC CHOLECYSTECTOMY  ~ 1999   LUMBAR LAMINECTOMY/DECOMPRESSION MICRODISCECTOMY N/A 06/26/2013   Procedure: LUMBAR LAMINECTOMY/DECOMPRESSION MICRODISCECTOMY;  Surgeon: Sinclair Ship, MD;  Location: [facility];  Service: Orthopedics;  Laterality: N/A;   TONSILLECTOMY  1975    Family History  Problem Relation Age of Onset   Breast cancer Mother    Pancreatic cancer Paternal Grandmother      Allergies  Allergen Reactions   Morphine And Related Rash    Current Medications:   Current Outpatient Medications:    amitriptyline (ELAVIL) 50 MG tablet, TAKE 1 TABLET BY MOUTH EVERYDAY AT BEDTIME, Disp: 90 tablet, Rfl: 1   amLODipine (NORVASC) 5 MG tablet, Take 1 tablet (5 mg total) by mouth daily., Disp: 90 tablet, Rfl: 1   azelastine (ASTELIN) 0.1 % nasal spray, Place 2 sprays into both nostrils 2 (two) times daily., Disp: 30 mL, Rfl: 12   omeprazole (PRILOSEC) 20 MG capsule, Take 1 capsule (20 mg total) by mouth daily., Disp: 30 capsule, Rfl: 3   albuterol (VENTOLIN HFA) 108 (90 Base) MCG/ACT inhaler, Inhale 2 puffs into the lungs every 6 (six) hours as needed for wheezing or shortness of breath. (Patient not taking: Reported on 01/11/2021), Disp: 18 g, Rfl: 1   fluticasone-salmeterol (ADVAIR HFA) 115-21 MCG/ACT inhaler, Inhale 2 puffs into the lungs 2 (two) times daily. (Patient not taking: Reported on 01/11/2021), Disp: 1 Inhaler, Rfl: 6   montelukast (SINGULAIR) 10 MG tablet, TAKE 1 TABLET BY MOUTH EVERYDAY AT BEDTIME (Patient not taking: Reported on 01/11/2021), Disp: 30 tablet, Rfl: 3   Review of Systems:   ROS Negative unless otherwise specified per HPI. Vitals:   Vitals:   01/11/21 1259  BP: (!) 142/90  Pulse: 78  Temp: 98.3 F (36.8 C)  TempSrc: Temporal  SpO2: 97%  Weight: 263 lb (119.3 kg)  Height: 6' (1.829 m)     Body mass index is 35.67 kg/m.  Physical Exam:   Physical Exam Vitals and nursing note reviewed.  Constitutional:      General: He is not in acute distress.    Appearance: He is well-developed. He is not ill-appearing or toxic-appearing.  Cardiovascular:     Rate and Rhythm: Normal rate and regular rhythm.     Pulses: Normal pulses.     Heart sounds: Normal heart sounds, S1 normal and S2 normal.  Pulmonary:     Effort: Pulmonary effort is normal.     Breath sounds: Normal breath sounds.  Skin:    General: Skin is warm and dry.   Neurological:     Mental Status: He is alert.     GCS: GCS eye subscore is 4. GCS verbal subscore is 5. GCS motor subscore is 6.  Psychiatric:        Speech: Speech normal.        Behavior: Behavior normal. Behavior is cooperative.    Assessment and Plan:   Acute cough No red flags on exam.  Suspect bronchitis. Provided depo-medrol injection per orders. No indication for abx at this time. Discussed taking medications as prescribed. Reviewed return precautions including worsening fever, SOB, worsening cough or other concerns. Push fluids and rest. I recommend that patient follow-up if symptoms worsen or persist despite treatment x 7-10 days, sooner if needed.  I,Havlyn C Ratchford,acting as a Education administrator for [employer], PA.,have documented all relevant documentation on the behalf of Inda Coke, PA,as directed by  Inda Coke, PA while in the presence of Inda Coke, [place].  I, Inda Coke, [place], have reviewed all documentation for this visit. The documentation on 01/11/21 for the exam, diagnosis, procedures, and orders are all accurate and complete. Inda Coke, PA-C

## 2021-03-12 ENCOUNTER — Other Ambulatory Visit: Payer: Self-pay | Admitting: Family Medicine

## 2021-03-15 ENCOUNTER — Encounter: Payer: Self-pay | Admitting: Physician Assistant

## 2021-06-08 ENCOUNTER — Encounter: Payer: Self-pay | Admitting: Physician Assistant

## 2021-06-08 ENCOUNTER — Ambulatory Visit (INDEPENDENT_AMBULATORY_CARE_PROVIDER_SITE_OTHER): Payer: BC Managed Care – PPO | Admitting: Physician Assistant

## 2021-06-08 VITALS — BP 124/100 | HR 74 | Temp 98.1°F | Ht 72.0 in | Wt 248.5 lb

## 2021-06-08 DIAGNOSIS — I10 Essential (primary) hypertension: Secondary | ICD-10-CM

## 2021-06-08 DIAGNOSIS — R059 Cough, unspecified: Secondary | ICD-10-CM | POA: Diagnosis not present

## 2021-06-08 MED ORDER — ALBUTEROL SULFATE HFA 108 (90 BASE) MCG/ACT IN AERS: 2.0000 | INHALATION_SPRAY | Freq: Four times a day (QID) | RESPIRATORY_TRACT | 1 refills | Status: DC | PRN

## 2021-06-08 MED ORDER — MONTELUKAST SODIUM 10 MG PO TABS: 10.0000 mg | ORAL_TABLET | Freq: Every day | ORAL | 3 refills | Status: DC

## 2021-06-08 MED ORDER — METHYLPREDNISOLONE ACETATE 80 MG/ML IJ SUSP
80.0000 mg | Freq: Once | INTRAMUSCULAR | Status: AC
  Administered 2021-06-08: 80 mg via INTRAMUSCULAR

## 2021-06-08 MED ORDER — ADVAIR HFA 115-21 MCG/ACT IN AERO: 2.0000 | INHALATION_SPRAY | Freq: Two times a day (BID) | RESPIRATORY_TRACT | 6 refills | Status: DC

## 2021-06-08 NOTE — Progress Notes (Signed)
Anthony Miller is a 66 y.o. male here for a new problem. ? ?History of Present Illness:  ? ?Chief Complaint  ?Patient presents with  ? Cough  ?  Pt c/o chronic cough, expectorating white creamy sputum, nasal congestion clear drainage, nosebleeds 2-3 times past week. Denies fever or chills.  ? ? ?HPI ? ?Cough ?Has been exposed to multiple illnesses since the beginning of the year. 3 weeks ago, had someone in his office had dx sinus infection. He developed sx 2-3 days later. Allergies are causing increased production of sputum. Has significant PND. Denies chest pain, SOB, n/v. ? ?Currently using Allegra and Mucinex. He has run out of his medications -- singulair and his Advair and Albuterol inhaler. He uses these regularly. ? ?HTN ?Currently taking amlodopine 5 mg. At home blood pressure readings are: normal. Patient denies chest pain, SOB, blurred vision, dizziness, unusual headaches, lower leg swelling. Patient is compliant with medication. Denies excessive caffeine intake, stimulant usage, excessive alcohol intake, or increase in salt consumption. ? ?BP Readings from Last 3 Encounters:  ?06/08/21 (!) 124/100  ?01/11/21 (!) 142/90  ?07/19/19 140/90  ? ? ? ? ?Past Medical History:  ?Diagnosis Date  ? Arthritis   ? "lower back/pelvic region" (06/21/2013)  ? GERD (gastroesophageal reflux disease)   ? Hiatal hernia   ? Hypertension   ? IBS (irritable bowel syndrome)   ? Pneumonia 1970's; ~ 2010  ? Status post dilation of esophageal narrowing   ? ?  ?Social History  ? ?Tobacco Use  ? Smoking status: Never  ? Smokeless tobacco: Former  ?  Types: Chew  ?  Quit date: 10/20/1983  ? Tobacco comments:  ?  "quit chewing in the 1980's"  ?Substance Use Topics  ? Alcohol use: Yes  ?  Alcohol/week: 2.0 standard drinks  ?  Types: 2 Shots of liquor per week  ? Drug use: No  ? ? ?Past Surgical History:  ?Procedure Laterality Date  ? LAPAROSCOPIC CHOLECYSTECTOMY  ~ 1999  ? LUMBAR LAMINECTOMY/DECOMPRESSION MICRODISCECTOMY N/A 06/26/2013   ? Procedure: LUMBAR LAMINECTOMY/DECOMPRESSION MICRODISCECTOMY;  Surgeon: Sinclair Ship, MD;  Location: [facility];  Service: Orthopedics;  Laterality: N/A;  ? TONSILLECTOMY  1975  ? ? ?Family History  ?Problem Relation Age of Onset  ? Breast cancer Mother   ? Pancreatic cancer Paternal Grandmother   ? ? ?Allergies  ?Allergen Reactions  ? Morphine And Related Rash  ? ? ?Current Medications:  ? ?Current Outpatient Medications:  ?  amitriptyline (ELAVIL) 50 MG tablet, TAKE 1 TABLET BY MOUTH EVERYDAY AT BEDTIME, Disp: 90 tablet, Rfl: 1 ?  amLODipine (NORVASC) 5 MG tablet, TAKE 1 TABLET (5 MG TOTAL) BY MOUTH DAILY., Disp: 90 tablet, Rfl: 1 ?  azelastine (ASTELIN) 0.1 % nasal spray, Place 2 sprays into both nostrils 2 (two) times daily., Disp: 30 mL, Rfl: 12 ?  omeprazole (PRILOSEC) 20 MG capsule, Take 1 capsule (20 mg total) by mouth daily., Disp: 30 capsule, Rfl: 3 ?  albuterol (VENTOLIN HFA) 108 (90 Base) MCG/ACT inhaler, Inhale 2 puffs into the lungs every 6 (six) hours as needed for wheezing or shortness of breath. (Patient not taking: Reported on 01/11/2021), Disp: 18 g, Rfl: 1 ?  fluticasone-salmeterol (ADVAIR HFA) 115-21 MCG/ACT inhaler, Inhale 2 puffs into the lungs 2 (two) times daily. (Patient not taking: Reported on 01/11/2021), Disp: 1 Inhaler, Rfl: 6 ?  montelukast (SINGULAIR) 10 MG tablet, TAKE 1 TABLET BY MOUTH EVERYDAY AT BEDTIME (Patient not taking: Reported on 01/11/2021),  Disp: 30 tablet, Rfl: 3  ? ?Review of Systems:  ? ?ROS ?Negative unless otherwise specified per HPI. ? ?Vitals:  ? ?Vitals:  ? 06/08/21 0913  ?BP: (!) 124/100  ?Pulse: 74  ?Temp: 98.1 ?F (36.7 ?C)  ?TempSrc: Temporal  ?SpO2: 97%  ?Weight: 248 lb 8 oz (112.7 kg)  ?Height: 6' (1.829 m)  ?   ?Body mass index is 33.7 kg/m?. ? ?Physical Exam:  ? ?Physical Exam ?Vitals and nursing note reviewed.  ?Constitutional:   ?   General: He is not in acute distress. ?   Appearance: He is well-developed. He is not ill-appearing or toxic-appearing.   ?HENT:  ?   Head: Normocephalic and atraumatic.  ?   Right Ear: Tympanic membrane, ear canal and external ear normal. Tympanic membrane is not erythematous, retracted or bulging.  ?   Left Ear: Tympanic membrane, ear canal and external ear normal. Tympanic membrane is not erythematous, retracted or bulging.  ?   Nose: Nose normal.  ?   Right Sinus: No maxillary sinus tenderness or frontal sinus tenderness.  ?   Left Sinus: No maxillary sinus tenderness or frontal sinus tenderness.  ?   Mouth/Throat:  ?   Pharynx: Uvula midline. No posterior oropharyngeal erythema.  ?Eyes:  ?   General: Lids are normal.  ?   Conjunctiva/sclera: Conjunctivae normal.  ?Neck:  ?   Trachea: Trachea normal.  ?Cardiovascular:  ?   Rate and Rhythm: Normal rate and regular rhythm.  ?   Heart sounds: Normal heart sounds, S1 normal and S2 normal.  ?Pulmonary:  ?   Effort: Pulmonary effort is normal.  ?   Breath sounds: Normal breath sounds. No decreased breath sounds, wheezing, rhonchi or rales.  ?Lymphadenopathy:  ?   Cervical: No cervical adenopathy.  ?Skin: ?   General: Skin is warm and dry.  ?Neurological:  ?   Mental Status: He is alert.  ?Psychiatric:     ?   Speech: Speech normal.     ?   Behavior: Behavior normal. Behavior is cooperative.  ? ? ?Assessment and Plan:  ? ?Cough, unspecified type ?No red flags ?Suspect viral [place] with flare of his reactive airway disease ?Restart singulair, Advair and Albuterol ?80 mg depomedrol given today ?Follow-up if new/worsening symptoms ? ?Primary hypertension ?Above goal but reports normotensive at home ?Continue norvasc 5 mg daily ?Continue to monitor at home ?Follow-up if numbers consistently > 150/90 ? ?Inda Coke, PA-C ?

## 2021-06-08 NOTE — Patient Instructions (Signed)
It was great to see you! ? ?You have a viral upper respiratory infection as well as a flare of your reactive airway disease. ?Antibiotics are not needed for this.  Viral infections usually take 7-10 days to resolve.  The cough can last a few weeks to go away. ? ?Use medication as prescribed: singulair, inhalers ? ?Push fluids and get plenty of rest. ?Please return if you are not improving as expected, or if you have high fevers (>101.5) or difficulty swallowing or worsening productive cough. ? ?Call clinic with questions. ? ?I hope you start feeling better soon!  ?

## 2021-06-30 ENCOUNTER — Other Ambulatory Visit: Payer: Self-pay | Admitting: Physician Assistant

## 2021-09-17 ENCOUNTER — Telehealth: Payer: Self-pay | Admitting: Physician Assistant

## 2021-09-17 NOTE — Telephone Encounter (Signed)
   LAST APPOINTMENT DATE:   06/08/21 OV with PCP   NEXT APPOINTMENT DATE: N/A   MEDICATION: amLODipine (NORVASC) 5 MG tablet [[id number]]    Is the patient out of medication?  Yes, first missed dose 08/04   PHARMACY:  [id number]- [place], [place] - [address] [place] [address] Phone:  [phone] Fax:  [phone] DEA #:  [id number]

## 2021-09-20 NOTE — Telephone Encounter (Signed)
Spoke to pt told him Rx for Amlodipine was sent to pharmacy for 90 day supply but you need to schedule a physical before medication runs out. Pt verbalized understanding.

## 2021-11-08 ENCOUNTER — Encounter: Payer: Self-pay | Admitting: *Deleted

## 2021-12-08 ENCOUNTER — Other Ambulatory Visit: Payer: Self-pay | Admitting: Family Medicine

## 2021-12-08 ENCOUNTER — Other Ambulatory Visit: Payer: Self-pay | Admitting: Physician Assistant

## 2021-12-30 ENCOUNTER — Other Ambulatory Visit: Payer: Self-pay | Admitting: Physician Assistant

## 2022-01-27 ENCOUNTER — Encounter: Payer: Self-pay | Admitting: *Deleted

## 2022-03-17 ENCOUNTER — Other Ambulatory Visit: Payer: Self-pay | Admitting: Physician Assistant

## 2022-05-03 ENCOUNTER — Other Ambulatory Visit: Payer: Self-pay | Admitting: Physician Assistant

## 2022-05-17 ENCOUNTER — Other Ambulatory Visit: Payer: Self-pay | Admitting: Physician Assistant

## 2022-07-15 ENCOUNTER — Other Ambulatory Visit: Payer: Self-pay | Admitting: Physician Assistant

## 2022-08-04 ENCOUNTER — Ambulatory Visit: Payer: BC Managed Care – PPO | Admitting: Physician Assistant

## 2022-08-05 ENCOUNTER — Encounter: Payer: Self-pay | Admitting: Physician Assistant

## 2022-08-05 ENCOUNTER — Ambulatory Visit (INDEPENDENT_AMBULATORY_CARE_PROVIDER_SITE_OTHER): Payer: BC Managed Care – PPO | Admitting: Physician Assistant

## 2022-08-05 VITALS — BP 150/104 | HR 86 | Temp 98.2°F | Ht 72.0 in | Wt 251.2 lb

## 2022-08-05 DIAGNOSIS — Z77098 Contact with and (suspected) exposure to other hazardous, chiefly nonmedicinal, chemicals: Secondary | ICD-10-CM

## 2022-08-05 DIAGNOSIS — R059 Cough, unspecified: Secondary | ICD-10-CM | POA: Diagnosis not present

## 2022-08-05 MED ORDER — ALBUTEROL SULFATE HFA 108 (90 BASE) MCG/ACT IN AERS: 2.0000 | INHALATION_SPRAY | Freq: Four times a day (QID) | RESPIRATORY_TRACT | 1 refills | Status: AC | PRN

## 2022-08-05 MED ORDER — AZITHROMYCIN 250 MG PO TABS: ORAL_TABLET | ORAL | 0 refills | Status: AC

## 2022-08-05 MED ORDER — FLUTICASONE-SALMETEROL 115-21 MCG/ACT IN AERO: 2.0000 | INHALATION_SPRAY | Freq: Two times a day (BID) | RESPIRATORY_TRACT | 6 refills | Status: DC

## 2022-08-05 MED ORDER — METHYLPREDNISOLONE ACETATE 80 MG/ML IJ SUSP
80.0000 mg | Freq: Once | INTRAMUSCULAR | Status: AC
Start: 2022-08-05 — End: 2022-08-05
  Administered 2022-08-05: 80 mg via INTRAMUSCULAR

## 2022-08-05 NOTE — Progress Notes (Signed)
Anthony Miller is a 67 y.o. male here for a new problem.  History of Present Illness:   Chief Complaint  Patient presents with   Cough    Covid in February, lingering cough since then. No fever.    HPI  Cough Patient is complaining of cough, rhinorrhea, and congestion. He states that him and his family contracted Covid in February with mild sxs and feels that it never totally cleared up. His daughter has become sick a couple of times since then and sometimes he might caught something from her. Patient complains that he coughs mucus up in the morning and experiences sneeze attacks. He manages sxs with claritin, zyrtec, nasal sprays, and daily advair inhaler.   Patient is requesting a refill on 108 mcg/act albuterol inhaler and 115-21 mcg/act advair inhaler.  Formaldehyde Exposure Patient expresses concern with lung cancer because of his formaldehyde exposure. He is requesting an exam for this reason.  Denies unintentional weight loss, bloody cough, chest pain, shortness of breath   Past Medical History:  Diagnosis Date   Arthritis    "lower back/pelvic region" (06/21/2013)   GERD (gastroesophageal reflux disease)    Hiatal hernia    Hypertension    IBS (irritable bowel syndrome)    Pneumonia 1970's; ~ 2010   Status post dilation of esophageal narrowing      Social History   Tobacco Use   Smoking status: Never   Smokeless tobacco: Former    Types: Chew    Quit date: 10/20/1983   Tobacco comments:    "quit chewing in the 1980's"  Substance Use Topics   Alcohol use: Yes    Alcohol/week: 2.0 standard drinks of alcohol    Types: 2 Shots of liquor per week   Drug use: No    Past Surgical History:  Procedure Laterality Date   LAPAROSCOPIC CHOLECYSTECTOMY  ~ 1999   LUMBAR LAMINECTOMY/DECOMPRESSION MICRODISCECTOMY N/A 06/26/2013   Procedure: LUMBAR LAMINECTOMY/DECOMPRESSION MICRODISCECTOMY;  Surgeon: Emilee Hero, MD;  Location: [facility];  Service: Orthopedics;   Laterality: N/A;   TONSILLECTOMY  1975    Family History  Problem Relation Age of Onset   Breast cancer Mother    Pancreatic cancer Paternal Grandmother     Allergies  Allergen Reactions   Morphine And Codeine Rash    Current Medications:   Current Outpatient Medications:    albuterol (VENTOLIN HFA) 108 (90 Base) MCG/ACT inhaler, Inhale 2 puffs into the lungs every 6 (six) hours as needed for wheezing or shortness of breath., Disp: 18 g, Rfl: 1   amitriptyline (ELAVIL) 50 MG tablet, TAKE 1 TABLET BY MOUTH EVERYDAY AT BEDTIME, Disp: 90 tablet, Rfl: 0   amLODipine (NORVASC) 5 MG tablet, TAKE 1 TABLET (5 MG TOTAL) BY MOUTH DAILY., Disp: 90 tablet, Rfl: 0   Azelastine HCl 137 MCG/SPRAY SOLN, PLACE 2 SPRAYS INTO BOTH NOSTRILS 2 (TWO) TIMES DAILY, Disp: 90 mL, Rfl: 1   fluticasone-salmeterol (ADVAIR HFA) 115-21 MCG/ACT inhaler, Inhale 2 puffs into the lungs 2 (two) times daily., Disp: 1 each, Rfl: 6   montelukast (SINGULAIR) 10 MG tablet, Take 1 tablet (10 mg total) by mouth at bedtime., Disp: 90 tablet, Rfl: 3   omeprazole (PRILOSEC) 20 MG capsule, Take 1 capsule (20 mg total) by mouth daily., Disp: 30 capsule, Rfl: 3   Review of Systems:   Review of Systems  Respiratory:  Positive for cough.     Vitals:   Vitals:   08/05/22 1129  BP: (!) 141/98  Pulse: 86  Temp: 98.2 F (36.8 C)  SpO2: 99%  Weight: 251 lb 3.2 oz (113.9 kg)  Height: 6' (1.829 m)     Body mass index is 34.07 kg/m.  Physical Exam:   Physical Exam Vitals and nursing note reviewed.  Constitutional:      General: He is not in acute distress.    Appearance: Normal appearance. He is well-developed. He is not ill-appearing or toxic-appearing.  HENT:     Head: Normocephalic and atraumatic.     Right Ear: Tympanic membrane, ear canal and external ear normal. Tympanic membrane is not erythematous, retracted or bulging.     Left Ear: Tympanic membrane, ear canal and external ear normal. Tympanic membrane is  not erythematous, retracted or bulging.     Nose: Nose normal.     Right Sinus: No maxillary sinus tenderness or frontal sinus tenderness.     Left Sinus: No maxillary sinus tenderness or frontal sinus tenderness.     Mouth/Throat:     Pharynx: Uvula midline. No posterior oropharyngeal erythema.  Eyes:     General: Lids are normal.     Extraocular Movements: Extraocular movements intact.     Conjunctiva/sclera: Conjunctivae normal.     Pupils: Pupils are equal, round, and reactive to light.  Neck:     Trachea: Trachea normal.  Cardiovascular:     Rate and Rhythm: Normal rate and regular rhythm.     Heart sounds: Normal heart sounds, S1 normal and S2 normal. No murmur heard.    No gallop.  Pulmonary:     Effort: Pulmonary effort is normal. No respiratory distress.     Breath sounds: Normal breath sounds. No decreased breath sounds, wheezing, rhonchi or rales.  Lymphadenopathy:     Cervical: No cervical adenopathy.  Skin:    General: Skin is warm and dry.  Neurological:     Mental Status: He is alert and oriented to person, place, and time.  Psychiatric:        Speech: Speech normal.        Behavior: Behavior normal. Behavior is cooperative.        Judgment: Judgment normal.     Assessment and Plan:   Cough, unspecified type No red flags on exam.  Will initiate azithromcyin and 80 mg depomedrol injection per orders. Discussed taking medications as prescribed. Reviewed return precautions including worsening fever, SOB, worsening cough or other concerns. Push fluids and rest. I recommend that patient follow-up if symptoms worsen or persist despite treatment x 7-10 days, sooner if needed.   Exposure to formaldehyde Will obtain chest xray; declined CT scan due to cost Consider pulmonary referral    I,Verona Buck,acting as a scribe for [employer], PA.,have documented all relevant documentation on the behalf of Jarold Motto, PA,as directed by  Jarold Motto, PA while in  the presence of Jarold Motto, [place].  I, Jarold Motto, [place], have reviewed all documentation for this visit. The documentation on 08/05/22 for the exam, diagnosis, procedures, and orders are all accurate and complete.   Jarold Motto, PA-C

## 2022-08-05 NOTE — Patient Instructions (Signed)
It was great to see you!  An order for an xray has been put in for you. You can go anytime within the next few weeks to have this done. To have this done, you can walk in at the [facility] location without a scheduled appointment.  The address is [address] It is across the street from [facility]. Lab and x-ray are located in the basement.   Hours of operation are M-F 8:30am to 5:00pm.  Please note that they are closed for lunch between 12:30 and 1:00pm.  Start azithromycin Steroid shot given today  Take care,  Jarold Motto PA-C

## 2022-08-24 ENCOUNTER — Other Ambulatory Visit: Payer: Self-pay | Admitting: Physician Assistant

## 2022-08-26 ENCOUNTER — Encounter: Payer: Self-pay | Admitting: Physician Assistant

## 2022-08-31 ENCOUNTER — Encounter: Payer: Self-pay | Admitting: Physician Assistant

## 2022-10-14 ENCOUNTER — Other Ambulatory Visit: Payer: Self-pay | Admitting: Physician Assistant

## 2022-10-28 ENCOUNTER — Telehealth: Payer: Self-pay | Admitting: Physician Assistant

## 2022-10-28 ENCOUNTER — Encounter: Payer: Self-pay | Admitting: Physician Assistant

## 2022-10-28 ENCOUNTER — Ambulatory Visit (INDEPENDENT_AMBULATORY_CARE_PROVIDER_SITE_OTHER): Payer: BC Managed Care – PPO | Admitting: Physician Assistant

## 2022-10-28 VITALS — BP 130/88 | HR 74 | Temp 97.3°F | Ht 72.0 in | Wt 247.5 lb

## 2022-10-28 DIAGNOSIS — R059 Cough, unspecified: Secondary | ICD-10-CM | POA: Diagnosis not present

## 2022-10-28 MED ORDER — METHYLPREDNISOLONE ACETATE 80 MG/ML IJ SUSP
80.0000 mg | Freq: Once | INTRAMUSCULAR | Status: AC
Start: 2022-10-28 — End: 2022-10-28
  Administered 2022-10-28: 80 mg via INTRAMUSCULAR

## 2022-10-28 MED ORDER — AZITHROMYCIN 250 MG PO TABS: ORAL_TABLET | ORAL | 0 refills | Status: DC

## 2022-10-28 NOTE — Telephone Encounter (Signed)
Patient is calling stating he was meant to start an antibiotic following OV today. States pharmacy doesn't have the prescription order. Can this be sent in please? He was meant to start azithromycin.

## 2022-10-28 NOTE — Progress Notes (Signed)
Anthony Miller is a 67 y.o. male here for a new problem.  History of Present Illness:   Chief Complaint  Patient presents with   Sinus Problem    Pt c/o sinus congestion, sneezing, coughing and expectorating yellow/brown sputum. Started two ago. Has been using inhalers. COVID test Negative.    HPI  Sinus problem Patient is complaining of sinus congestion, sneezing, cough with yellow/brown sputum starting 2 months ago. He states that he was snorkeling 2 months ago when water got into the snorkel which he believes aggravated his sinuses. Patient took an at home COVID test which was negative. Managing sxs with albuterol inhaler intermittently and advair hfa inhaler. Confirms ear pain L>R. Denies fever and SOB.   Past Medical History:  Diagnosis Date   Arthritis    "lower back/pelvic region" (06/21/2013)   GERD (gastroesophageal reflux disease)    Hiatal hernia    Hypertension    IBS (irritable bowel syndrome)    Pneumonia 1970's; ~ 2010   Status post dilation of esophageal narrowing      Social History   Tobacco Use   Smoking status: Never   Smokeless tobacco: Former    Types: Chew    Quit date: 10/20/1983   Tobacco comments:    "quit chewing in the 1980's"  Substance Use Topics   Alcohol use: Yes    Alcohol/week: 2.0 standard drinks of alcohol    Types: 2 Shots of liquor per week   Drug use: No    Past Surgical History:  Procedure Laterality Date   LAPAROSCOPIC CHOLECYSTECTOMY  ~ 1999   LUMBAR LAMINECTOMY/DECOMPRESSION MICRODISCECTOMY N/A 06/26/2013   Procedure: LUMBAR LAMINECTOMY/DECOMPRESSION MICRODISCECTOMY;  Surgeon: Emilee Hero, MD;  Location: [facility];  Service: Orthopedics;  Laterality: N/A;   TONSILLECTOMY  1975    Family History  Problem Relation Age of Onset   Breast cancer Mother    Pancreatic cancer Paternal Grandmother     Allergies  Allergen Reactions   Morphine And Codeine Rash    Current Medications:   Current Outpatient  Medications:    albuterol (VENTOLIN HFA) 108 (90 Base) MCG/ACT inhaler, Inhale 2 puffs into the lungs every 6 (six) hours as needed for wheezing or shortness of breath., Disp: 18 g, Rfl: 1   amitriptyline (ELAVIL) 50 MG tablet, TAKE 1 TABLET BY MOUTH EVERYDAY AT BEDTIME, Disp: 90 tablet, Rfl: 0   amLODipine (NORVASC) 5 MG tablet, TAKE 1 TABLET (5 MG TOTAL) BY MOUTH DAILY., Disp: 90 tablet, Rfl: 0   Azelastine HCl 137 MCG/SPRAY SOLN, PLACE 2 SPRAYS INTO BOTH NOSTRILS 2 (TWO) TIMES DAILY, Disp: 90 mL, Rfl: 1   fluticasone-salmeterol (ADVAIR HFA) 115-21 MCG/ACT inhaler, Inhale 2 puffs into the lungs 2 (two) times daily., Disp: 1 each, Rfl: 6   omeprazole (PRILOSEC) 20 MG capsule, Take 1 capsule (20 mg total) by mouth daily., Disp: 30 capsule, Rfl: 3   Review of Systems:   ROS Negative unless otherwise specified per HPI.  Vitals:   Vitals:   10/28/22 1009 10/28/22 1024  BP: (!) 130/90 130/88  Pulse: 74   Temp: (!) 97.3 F (36.3 C)   TempSrc: Temporal   SpO2: 96%   Weight: 247 lb 8 oz (112.3 kg)   Height: 6' (1.829 m)      Body mass index is 33.57 kg/m.  Physical Exam:   Physical Exam Vitals and nursing note reviewed.  Constitutional:      General: He is not in acute distress.  Appearance: Normal appearance. He is well-developed. He is not ill-appearing or toxic-appearing.  HENT:     Head: Normocephalic and atraumatic.     Right Ear: External ear normal.     Left Ear: External ear normal.  Eyes:     Extraocular Movements: Extraocular movements intact.     Pupils: Pupils are equal, round, and reactive to light.  Cardiovascular:     Rate and Rhythm: Normal rate and regular rhythm.     Pulses: Normal pulses.     Heart sounds: Normal heart sounds, S1 normal and S2 normal. No murmur heard.    No gallop.  Pulmonary:     Effort: Pulmonary effort is normal. No respiratory distress.     Breath sounds: Normal breath sounds. No wheezing or rales.  Skin:    General: Skin is warm  and dry.  Neurological:     Mental Status: He is alert and oriented to person, place, and time.     GCS: GCS eye subscore is 4. GCS verbal subscore is 5. GCS motor subscore is 6.  Psychiatric:        Speech: Speech normal.        Behavior: Behavior normal. Behavior is cooperative.        Judgment: Judgment normal.     Assessment and Plan:   Cough, unspecified type No red flags on exam.   Depomedrol injection received -- 80 mg today Start azithromycin Continue inhalers as prescribed Discussed taking medications as prescribed. Reviewed return precautions including worsening fever, SOB, worsening cough or other concerns. Push fluids and rest. I recommend that patient follow-up if symptoms worsen or persist despite treatment x 7-10 days, sooner if needed.  I,Verona Buck,acting as a Neurosurgeon for [employer], PA.,have documented all relevant documentation on the behalf of Jarold Motto, PA,as directed by  Jarold Motto, PA while in the presence of Jarold Motto, [place].  I, Jarold Motto, [place], have reviewed all documentation for this visit. The documentation on 10/28/22 for the exam, diagnosis, procedures, and orders are all accurate and complete.  Jarold Motto, PA-C

## 2022-10-28 NOTE — Patient Instructions (Signed)
It was great to see you!  Start azithromycin antibiotic  Please follow-up when feeling better for your annual Comprehensive Physical Exam (CPE) preventive care annual visit  If any worsening, reach out  Take care,  Jarold Motto PA-C

## 2022-10-28 NOTE — Telephone Encounter (Signed)
Spoke to pt told him Rx for Z-PAK was just sent over to the pharmacy for you. Pt verbalized understanding.

## 2022-12-09 ENCOUNTER — Other Ambulatory Visit: Payer: Self-pay | Admitting: *Deleted

## 2022-12-09 NOTE — Telephone Encounter (Signed)
Error

## 2022-12-27 ENCOUNTER — Encounter: Payer: BC Managed Care – PPO | Admitting: Physician Assistant

## 2022-12-29 ENCOUNTER — Ambulatory Visit (INDEPENDENT_AMBULATORY_CARE_PROVIDER_SITE_OTHER): Payer: BC Managed Care – PPO | Admitting: Physician Assistant

## 2022-12-29 ENCOUNTER — Encounter: Payer: Self-pay | Admitting: Physician Assistant

## 2022-12-29 VITALS — BP 140/90 | HR 87 | Temp 97.3°F | Ht 73.0 in | Wt 249.0 lb

## 2022-12-29 DIAGNOSIS — I10 Essential (primary) hypertension: Secondary | ICD-10-CM | POA: Diagnosis not present

## 2022-12-29 DIAGNOSIS — E785 Hyperlipidemia, unspecified: Secondary | ICD-10-CM

## 2022-12-29 DIAGNOSIS — R7309 Other abnormal glucose: Secondary | ICD-10-CM | POA: Diagnosis not present

## 2022-12-29 DIAGNOSIS — E669 Obesity, unspecified: Secondary | ICD-10-CM | POA: Diagnosis not present

## 2022-12-29 DIAGNOSIS — Z23 Encounter for immunization: Secondary | ICD-10-CM | POA: Diagnosis not present

## 2022-12-29 DIAGNOSIS — Z125 Encounter for screening for malignant neoplasm of prostate: Secondary | ICD-10-CM

## 2022-12-29 DIAGNOSIS — Z0000 Encounter for general adult medical examination without abnormal findings: Secondary | ICD-10-CM

## 2022-12-29 DIAGNOSIS — R29818 Other symptoms and signs involving the nervous system: Secondary | ICD-10-CM

## 2022-12-29 LAB — CBC WITH DIFFERENTIAL/PLATELET
Basophils Absolute: 0.1 K/uL (ref 0.0–0.1)
Basophils Relative: 1 % (ref 0.0–3.0)
Eosinophils Absolute: 0.5 K/uL (ref 0.0–0.7)
Eosinophils Relative: 7.3 % — ABNORMAL HIGH (ref 0.0–5.0)
HCT: 45.8 % (ref 39.0–52.0)
Hemoglobin: 15.6 g/dL (ref 13.0–17.0)
Lymphocytes Relative: 31.3 % (ref 12.0–46.0)
Lymphs Abs: 1.9 K/uL (ref 0.7–4.0)
MCHC: 34 g/dL (ref 30.0–36.0)
MCV: 93.2 fl (ref 78.0–100.0)
Monocytes Absolute: 0.5 K/uL (ref 0.1–1.0)
Monocytes Relative: 8.8 % (ref 3.0–12.0)
Neutro Abs: 3.2 K/uL (ref 1.4–7.7)
Neutrophils Relative %: 51.6 % (ref 43.0–77.0)
Platelets: 299 K/uL (ref 150.0–400.0)
RBC: 4.92 Mil/uL (ref 4.22–5.81)
RDW: 13.2 % (ref 11.5–15.5)
WBC: 6.2 K/uL (ref 4.0–10.5)

## 2022-12-29 LAB — COMPREHENSIVE METABOLIC PANEL WITH GFR
ALT: 22 U/L (ref 0–53)
AST: 23 U/L (ref 0–37)
Albumin: 4.6 g/dL (ref 3.5–5.2)
Alkaline Phosphatase: 48 U/L (ref 39–117)
BUN: 13 mg/dL (ref 6–23)
CO2: 27 meq/L (ref 19–32)
Calcium: 9.3 mg/dL (ref 8.4–10.5)
Chloride: 103 meq/L (ref 96–112)
Creatinine, Ser: 1.19 mg/dL (ref 0.40–1.50)
GFR: 64.34 mL/min
Glucose, Bld: 102 mg/dL — ABNORMAL HIGH (ref 70–99)
Potassium: 4.1 meq/L (ref 3.5–5.1)
Sodium: 137 meq/L (ref 135–145)
Total Bilirubin: 0.6 mg/dL (ref 0.2–1.2)
Total Protein: 7.3 g/dL (ref 6.0–8.3)

## 2022-12-29 LAB — LIPID PANEL
Cholesterol: 142 mg/dL (ref 0–200)
HDL: 34.2 mg/dL — ABNORMAL LOW
LDL Cholesterol: 90 mg/dL (ref 0–99)
NonHDL: 107.94
Total CHOL/HDL Ratio: 4
Triglycerides: 90 mg/dL (ref 0.0–149.0)
VLDL: 18 mg/dL (ref 0.0–40.0)

## 2022-12-29 LAB — HEMOGLOBIN A1C: Hgb A1c MFr Bld: 5.6 % (ref 4.6–6.5)

## 2022-12-29 LAB — PSA, MEDICARE: PSA: 1.49 ng/mL (ref 0.10–4.00)

## 2022-12-29 MED ORDER — AMLODIPINE BESYLATE 5 MG PO TABS: 5.0000 mg | ORAL_TABLET | Freq: Every day | ORAL | 1 refills | Status: DC

## 2022-12-29 MED ORDER — AMITRIPTYLINE HCL 50 MG PO TABS: 50.0000 mg | ORAL_TABLET | Freq: Every day | ORAL | 1 refills | Status: DC

## 2022-12-29 NOTE — Progress Notes (Signed)
Anthony Miller is a 67 y.o. male and is here for a comprehensive physical exam.  HPI  Health Maintenance Due  Topic Date Due   Medicare Annual Wellness (AWV)  Never done   COVID-19 Vaccine  Never done   Acute Concerns: None.  Chronic Issues: Allergies: Managed with Advair  States he mainly uses it when symptoms flare. Has not required albuterol as needed.  OSA Reports he never did sleep study.  Discussed referral for home sleep study  Insomnia Insomnia managed with 50 mg Amitriptyline nightly.   HTN Currently taking norvasc 5 mg. At home blood pressure readings are: not checked. Patient denies chest pain, SOB, blurred vision, dizziness, unusual headaches, lower leg swelling. Patient is compliant with medication. Denies excessive caffeine intake, stimulant usage, excessive alcohol intake, or increase in salt consumption.  BP Readings from Last 3 Encounters:  12/29/22 (!) 140/90  10/28/22 130/88  08/05/22 (!) 150/104     Health Maintenance: Immunizations -- flu shot provided today Endoscopy -- Last done 11/17/15. Benign-appearing esophageal stenosis - dilated; LA Grade A reflux esophagitis; Mucosal nodule found in the esophagus - biopsied; Gastritis - biopsied; Small hiatal hernia; Normal duodenal bulb and second portion of the duodenum. Repeat 2027. PSA --  Lab Results  Component Value Date   PSA 0.9 10/22/2015   Diet -- Healthy overall Sleep habits -- OSA, otherwise good sleep quality.  Exercise -- Regular exercise: mows his yard and 2-3x/week other exercise  Weight -- Recent weight history Wt Readings from Last 10 Encounters:  12/29/22 249 lb (112.9 kg)  10/28/22 247 lb 8 oz (112.3 kg)  08/05/22 251 lb 3.2 oz (113.9 kg)  06/08/21 248 lb 8 oz (112.7 kg)  01/11/21 263 lb (119.3 kg)  07/19/19 250 lb 4 oz (113.5 kg)  06/04/19 240 lb (108.9 kg)  03/05/19 257 lb (116.6 kg)  02/19/19 257 lb (116.6 kg)  04/26/18 259 lb 3.2 oz (117.6 kg)   Body mass index is  32.85 kg/m.  Mood -- Stable Alcohol use --  reports current alcohol use of about 2.0 standard drinks of alcohol per week.  Tobacco use --  Tobacco Use: Medium Risk (12/29/2022)   Patient History    Smoking Tobacco Use: Never    Smokeless Tobacco Use: Former    Passive Exposure: Not on file    Eligible for Low Dose CT?  UTD with eye doctor? No, wears reading glasses but otherwise has not needed to. UTD with dentist? Yes Established/UTD with dermatology     12/29/2022    8:34 AM  Depression screen PHQ 2/9  Decreased Interest 0  Down, Depressed, Hopeless 0  PHQ - 2 Score 0    Other providers/specialists: Patient Care Team: Jarold Motto, [place] as PCP - General (Physician Assistant)   PMHx, SurgHx, SocialHx, Medications, and Allergies were reviewed in the Visit Navigator and updated as appropriate.   Past Medical History:  Diagnosis Date   Arthritis    "lower back/pelvic region" (06/21/2013)   GERD (gastroesophageal reflux disease)    Hiatal hernia    Hypertension    IBS (irritable bowel syndrome)    Pneumonia 1970's; ~ 2010   Status post dilation of esophageal narrowing     Past Surgical History:  Procedure Laterality Date   LAPAROSCOPIC CHOLECYSTECTOMY  ~ 1999   LUMBAR LAMINECTOMY/DECOMPRESSION MICRODISCECTOMY N/A 06/26/2013   Procedure: LUMBAR LAMINECTOMY/DECOMPRESSION MICRODISCECTOMY;  Surgeon: Emilee Hero, MD;  Location: [facility] OR;  Service: Orthopedics;  Laterality: N/A;   TONSILLECTOMY  1975   Family History  Problem Relation Age of Onset   Breast cancer Mother    Parkinson's disease Mother    Alzheimer's disease Father    Subarachnoid hemorrhage Father    Pancreatic cancer Paternal Grandmother    Social History   Tobacco Use   Smoking status: Never   Smokeless tobacco: Former    Types: Chew    Quit date: 10/20/1983   Tobacco comments:    "quit chewing in the 1980's"  Substance Use Topics   Alcohol use: Yes    Alcohol/week: 2.0 standard  drinks of alcohol    Types: 2 Shots of liquor per week   Drug use: No   Review of Systems:   Review of Systems  Constitutional:  Negative for chills, fever, malaise/fatigue and weight loss.  HENT:  Negative for hearing loss, sinus pain and sore throat.   Respiratory:  Negative for cough and hemoptysis.   Cardiovascular:  Negative for chest pain, palpitations, leg swelling and PND.  Gastrointestinal:  Negative for abdominal pain, constipation, diarrhea, heartburn, nausea and vomiting.  Genitourinary:  Negative for dysuria, frequency and urgency.  Musculoskeletal:  Negative for back pain, myalgias and neck pain.  Skin:  Negative for itching and rash.  Neurological:  Negative for dizziness, tingling, seizures and headaches.  Endo/Heme/Allergies:  Negative for polydipsia.  Psychiatric/Behavioral:  Negative for depression. The patient is not nervous/anxious.      Objective:    Vitals:   12/29/22 0829 12/29/22 0850  BP: (!) 130/90 (!) 140/90  Pulse: 87   Temp: (!) 97.3 F (36.3 C)   SpO2: 96%     Body mass index is 32.85 kg/m.  General  Alert, cooperative, no distress, appears stated age  Head:  Normocephalic, without obvious abnormality, atraumatic  Eyes:  PERRL, conjunctiva/corneas clear, EOM's intact, fundi benign, both eyes       Ears:  Normal TM's and external ear canals, both ears  Nose: Nares normal, septum midline, mucosa normal, no drainage or sinus tenderness  Throat: Lips, mucosa, and tongue normal; teeth and gums normal  Neck: Supple, symmetrical, trachea midline, no adenopathy;     thyroid:  No enlargement/tenderness/nodules; no carotid bruit or JVD  Back:   Symmetric, no curvature, ROM normal, no CVA tenderness  Lungs:   Clear to auscultation bilaterally, respirations unlabored  Chest wall:  No tenderness or deformity  Heart:  Regular rate and rhythm, S1 and S2 normal, no murmur, rub or gallop  Abdomen:   Soft, non-tender, bowel sounds active all four  quadrants, no masses, no organomegaly  Extremities: Extremities normal, atraumatic, no cyanosis or edema  Prostate : UpToDate    Skin: Skin color, texture, turgor normal, no rashes or lesions  Lymph nodes: Cervical, supraclavicular, and axillary nodes normal  Neurologic: CNII-XII grossly intact. Normal strength, sensation and reflexes throughout   AssessmentPlan:   Routine physical examination Today patient counseled on age appropriate routine health concerns for screening and prevention, each reviewed and up to date or declined. Immunizations reviewed and up to date or declined. Labs ordered and reviewed. Risk factors for depression reviewed and negative. Hearing function and visual acuity are intact. ADLs screened and addressed as needed. Functional ability and level of safety reviewed and appropriate. Education, counseling and referrals performed based on assessed risks today. Patient provided with a copy of personalized plan for preventive services.  Primary hypertension Above goal today No evidence of end-organ damage on my exam Recommend patient monitor home blood pressure at least  a few times weekly Continue amlodipine 5 mg daily If home monitoring shows consistent elevation, or any symptom(s) develop, recommend reach out to [place] for further advice on next steps  Dyslipidemia Update lipid panel and make recommendations  Prostate cancer screening Update PSA Denies symptom(s)   Obesity, unspecified class, unspecified obesity type, unspecified whether serious comorbidity present Continue efforts at healthy lifestyle  Elevated glucose Update blood work and provide recommendations  Suspected sleep apnea Will place referral for obstructive sleep apnea evaluation  Need for immunization against influenza Update immunization    I,Emily Lagle,acting as a scribe for [employer], PA.,have documented all relevant documentation on the behalf of Jarold Motto, PA,as directed by   Jarold Motto, PA while in the presence of Jarold Motto, [place].  I, Jarold Motto, [place], have reviewed all documentation for this visit. The documentation on 12/29/22 for the exam, diagnosis, procedures, and orders are all accurate and complete.  Jarold Motto, PA-C [facility]

## 2022-12-29 NOTE — Patient Instructions (Addendum)
It was great to see you! ? ?Please go to the lab for blood work.  ? ?Our office will call you with your results unless you have chosen to receive results via MyChart. ? ?If your blood work is normal we will follow-up each year for physicals and as scheduled for chronic medical problems. ? ?If anything is abnormal we will treat accordingly and get you in for a follow-up. ? ?Take care, ? ?Jerrin Recore ?  ? ? ?

## 2023-04-05 ENCOUNTER — Other Ambulatory Visit: Payer: Self-pay | Admitting: Physician Assistant

## 2023-04-18 DIAGNOSIS — M1711 Unilateral primary osteoarthritis, right knee: Secondary | ICD-10-CM | POA: Diagnosis not present

## 2023-04-18 DIAGNOSIS — M25561 Pain in right knee: Secondary | ICD-10-CM | POA: Diagnosis not present

## 2023-04-19 DIAGNOSIS — M25561 Pain in right knee: Secondary | ICD-10-CM | POA: Diagnosis not present

## 2023-04-24 DIAGNOSIS — M25561 Pain in right knee: Secondary | ICD-10-CM | POA: Diagnosis not present

## 2023-05-04 DIAGNOSIS — S83241A Other tear of medial meniscus, current injury, right knee, initial encounter: Secondary | ICD-10-CM | POA: Diagnosis not present

## 2023-08-06 ENCOUNTER — Other Ambulatory Visit: Payer: Self-pay | Admitting: Physician Assistant

## 2023-10-20 ENCOUNTER — Encounter: Admitting: Physician Assistant

## 2023-12-20 ENCOUNTER — Ambulatory Visit: Admitting: Physician Assistant

## 2023-12-20 VITALS — BP 140/90 | HR 71 | Temp 98.0°F | Ht 73.0 in | Wt 252.5 lb

## 2023-12-20 DIAGNOSIS — Z23 Encounter for immunization: Secondary | ICD-10-CM | POA: Diagnosis not present

## 2023-12-20 DIAGNOSIS — Z125 Encounter for screening for malignant neoplasm of prostate: Secondary | ICD-10-CM | POA: Diagnosis not present

## 2023-12-20 DIAGNOSIS — E785 Hyperlipidemia, unspecified: Secondary | ICD-10-CM | POA: Diagnosis not present

## 2023-12-20 DIAGNOSIS — I10 Essential (primary) hypertension: Secondary | ICD-10-CM | POA: Diagnosis not present

## 2023-12-20 DIAGNOSIS — R7309 Other abnormal glucose: Secondary | ICD-10-CM

## 2023-12-20 LAB — COMPREHENSIVE METABOLIC PANEL WITH GFR
ALT: 24 U/L (ref 0–53)
AST: 25 U/L (ref 0–37)
Albumin: 5 g/dL (ref 3.5–5.2)
Alkaline Phosphatase: 49 U/L (ref 39–117)
BUN: 11 mg/dL (ref 6–23)
CO2: 28 meq/L (ref 19–32)
Calcium: 9.7 mg/dL (ref 8.4–10.5)
Chloride: 102 meq/L (ref 96–112)
Creatinine, Ser: 1.19 mg/dL (ref 0.40–1.50)
GFR: 63.9 mL/min (ref >60.00)
Glucose, Bld: 87 mg/dL (ref 70–99)
Potassium: 4.8 meq/L (ref 3.5–5.1)
Sodium: 138 meq/L (ref 135–145)
Total Bilirubin: 0.9 mg/dL (ref 0.2–1.2)
Total Protein: 8 g/dL (ref 6.0–8.3)

## 2023-12-20 LAB — CBC WITH DIFFERENTIAL/PLATELET
Basophils Absolute: 0 K/uL (ref 0.0–0.1)
Basophils Relative: 0.7 % (ref 0.0–3.0)
Eosinophils Absolute: 0.2 K/uL (ref 0.0–0.7)
Eosinophils Relative: 3.3 % (ref 0.0–5.0)
HCT: 49.3 % (ref 39.0–52.0)
Hemoglobin: 16.8 g/dL (ref 13.0–17.0)
Lymphocytes Relative: 30.3 % (ref 12.0–46.0)
Lymphs Abs: 2 K/uL (ref 0.7–4.0)
MCHC: 34 g/dL (ref 30.0–36.0)
MCV: 90 fl (ref 78.0–100.0)
Monocytes Absolute: 0.5 K/uL (ref 0.1–1.0)
Monocytes Relative: 8.4 % (ref 3.0–12.0)
Neutro Abs: 3.7 K/uL (ref 1.4–7.7)
Neutrophils Relative %: 57.3 % (ref 43.0–77.0)
Platelets: 314 K/uL (ref 150.0–400.0)
RBC: 5.47 Mil/uL (ref 4.22–5.81)
RDW: 13.5 % (ref 11.5–15.5)
WBC: 6.5 K/uL (ref 4.0–10.5)

## 2023-12-20 LAB — LIPID PANEL
Cholesterol: 158 mg/dL (ref 0–200)
HDL: 37.3 mg/dL — ABNORMAL LOW (ref >39.00)
LDL Cholesterol: 98 mg/dL (ref 0–99)
NonHDL: 120.25
Total CHOL/HDL Ratio: 4
Triglycerides: 113 mg/dL (ref 0.0–149.0)
VLDL: 22.6 mg/dL (ref 0.0–40.0)

## 2023-12-20 LAB — HEMOGLOBIN A1C: Hgb A1c MFr Bld: 5.6 % (ref 4.6–6.5)

## 2023-12-20 NOTE — Patient Instructions (Signed)
 EKG is overall stable compared to prior exam. I will review it with one of the physicians and let you know if any concerns.  We will go ahead and get blood work today for your Comprehensive Physical Exam (CPE) preventive care annual visit -- please tell front desk to schedule you and override my session limits for this.  Increase amlodipine  to 10 mg daily  Your blood pressure is elevated in our office today.  I recommend that you monitor this at home.  Your goal blood pressure should be around < 130/80, unless you are over 64 years old, your goal may be closer to 140-150/90. Please note if you have been given other goals from a cardiologist or other healthcare provider, please defer to their recommendations.  When preparing to take your blood pressure: Plan ahead. Don't smoke, drink caffeine or exercise within 30 minutes before taking your blood pressure. Empty your bladder. Don't take the measurement over clothes. Remove the clothing over the arm that will be used to measure blood pressure. You can use either arm unless otherwise told by a healthcare provider. Usually there is not a big difference between readings on them. Be still. Allow at least five minutes of quiet rest before measurements. Don't talk or use the phone. Sit correctly. Sit with your back straight and supported (on a dining chair, rather than a sofa). Your feet should be flat on the floor. Do not cross your legs. Support your arm on a flat surface. The middle of the cuff should be placed on the upper arm at heart level.  Measure at the same time of the day. Take multiple readings and record the results. Each time you measure, take two readings one minute apart. Record the results and bring in to your next office visit.  In order to know how well the medication is working, I would like you to take your readings 1-2 hours after taking your blood pressure medication if possible. Take your blood pressure measurements and record  2-3 days per week.  If you get a high blood pressure reading: A single high reading is not an immediate cause for alarm. If you get a reading that is higher than normal, take your blood pressure a second time. Write down the results of both measurements. Check with your health care professional to see if there's a health concern or whether there may be problems with your monitor. If your blood pressure readings are suddenly higher than 180/120 mm Hg, wait at least one minute and test again. If your readings are still very high, contact your health care professional immediately. You could be having a hypertensive crisis. Call 911 if your blood pressure is higher than 180/120 mm Hg and if you are having new signs or symptoms that may include: Chest pain Shortness of breath Back pain Numbness Weakness Change in vision Difficulty speaking Confusion Dizziness Vomiting

## 2023-12-20 NOTE — Progress Notes (Signed)
 Anthony Miller is a 68 y.o. male here for a follow up of a pre-existing problem.  History of Present Illness:   Chief Complaint  Patient presents with   Hypertension    Pt c/o blood pressure elevated for the past 3 days, having nausea and headache yesterday. Denies Chest pain or SOB.  Bp running 151/96 now before he came, this morning 160/103, last night 152/103.    Discussed the use of AI scribe software for clinical note transcription with the patient, who gave verbal consent to proceed.  History of Present Illness   Anthony Miller is a 68 year old male with hypertension who presents with elevated blood pressure and mild nausea.  Three days ago, he began experiencing mild nausea and tinnitus, with concurrent elevated blood pressure readings. His usual blood pressure is around 130-135/90 mmHg on his current medication, but recent readings include 151/96 mmHg, 160/103 mmHg, and 152/103 mmHg. The nausea is mild, without vomiting. Tinnitus has become more noticeable, especially at night.  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 He continues to take omeprazole  daily and has not experienced any cold or flu symptoms. He remains active, having mowed his yard two days ago, and reports no recent changes in his general health aside from the current symptoms. No chest pain, palpitations, or recent travel beyond [place], [place] .        Past Medical History:  Diagnosis Date   Arthritis    lower back/pelvic region (06/21/2013)   GERD (gastroesophageal reflux disease)    Hiatal hernia    Hypertension    IBS (irritable bowel syndrome)    Pneumonia 1970's; ~ 2010   Status post dilation of esophageal narrowing      Social History   Tobacco Use   Smoking status: Never   Smokeless tobacco: Former    Types: Chew    Quit date: 10/20/1983   Tobacco comments:    quit chewing in the 1980's  Substance Use  Topics   Alcohol use: Yes    Alcohol/week: 2.0 standard drinks of alcohol    Types: 2 Shots of liquor per week   Drug use: No    Past Surgical History:  Procedure Laterality Date   LAPAROSCOPIC CHOLECYSTECTOMY  ~ 1999   LUMBAR LAMINECTOMY/DECOMPRESSION MICRODISCECTOMY N/A 06/26/2013   Procedure: LUMBAR LAMINECTOMY/DECOMPRESSION MICRODISCECTOMY;  Surgeon: Oneil Rodgers Priestly, MD;  Location: [facility] OR;  Service: Orthopedics;  Laterality: N/A;   TONSILLECTOMY  1975    Family History  Problem Relation Age of Onset   Breast cancer Mother    Parkinson's disease Mother    Alzheimer's disease Father    Subarachnoid hemorrhage Father    Pancreatic cancer Paternal Grandmother     Allergies  Allergen Reactions   Morphine  And Codeine Rash    Current Medications:   Current Outpatient Medications:    albuterol  (VENTOLIN  HFA) 108 (90 Base) MCG/ACT inhaler, Inhale 2 puffs into the lungs every 6 (six) hours as needed for wheezing or shortness of breath., Disp: 18 g, Rfl: 1   amitriptyline  (ELAVIL ) 50 MG tablet, TAKE 1 TABLET BY MOUTH EVERYDAY AT BEDTIME, Disp: 90 tablet, Rfl: 1   amLODipine  (NORVASC ) 5 MG tablet, TAKE 1 TABLET (5 MG TOTAL) BY MOUTH DAILY., Disp: 90 tablet, Rfl: 1   omeprazole  (PRILOSEC) 20 MG capsule, Take 1 capsule (20 mg total) by mouth daily., Disp: 30 capsule, Rfl: 3   Review of Systems:   Negative unless otherwise  specified per HPI.  Vitals:   Vitals:   12/20/23 1016 12/20/23 1109  BP: (!) 152/100 (!) 140/90  Pulse: 71   Temp: 98 F (36.7 C)   TempSrc: Temporal   SpO2: 97%   Weight: 252 lb 8 oz (114.5 kg)   Height: 6' 1 (1.854 m)      Body mass index is 33.31 kg/m.  Physical Exam:   Physical Exam Vitals and nursing note reviewed.  Constitutional:      General: He is not in acute distress.    Appearance: He is well-developed. He is not ill-appearing or toxic-appearing.  Cardiovascular:     Rate and Rhythm: Normal rate and regular rhythm.     Pulses:  Normal pulses.     Heart sounds: Normal heart sounds, S1 normal and S2 normal.  Pulmonary:     Effort: Pulmonary effort is normal.     Breath sounds: Normal breath sounds.  Skin:    General: Skin is warm and dry.  Neurological:     Mental Status: He is alert.     GCS: GCS eye subscore is 4. GCS verbal subscore is 5. GCS motor subscore is 6.  Psychiatric:        Speech: Speech normal.        Behavior: Behavior normal. Behavior is cooperative.     Assessment and Plan:   Assessment and Plan    Essential hypertension Recent blood pressure spike with readings of 151/96, 160/103, and 152/103. Symptoms include mild nausea and tinnitus. Stress from wife's cancer diagnosis may contribute. No chest pain or palpitations. Current medication is 5 mg once daily. - Increase amlodipine  to 10 mg daily - EKG tracing is personally reviewed.  EKG notes NSR.  No acute changes.  - Advised to take both doses together starting tomorrow. - follow up in 2-4 weeks for Comprehensive Physical Exam (CPE) preventive care annual visit and blood pressure medication follow up  Recommend patient monitor home blood pressure at least a few times weekly If chest pain, shortness of breath, or other urgent symptom(s), needs to go to the ER  HLD Update lipid panel and advise accordingly  Elevated glucose Update Hemoglobin A1c and advise accordingly  Prostate cancer screening Update PSA at this time in preparation for Comprehensive Physical Exam (CPE) preventive care annual visit coming up soon; reports there is no concerns  General Health Maintenance Discussed need for physical exam due to insurance and importance of blood pressure follow-up. - Reschedule physical exam after the first of the year. - Ensure close follow-up for blood pressure management.          Lucie Buttner, PA-C

## 2023-12-21 ENCOUNTER — Ambulatory Visit: Payer: Self-pay | Admitting: Physician Assistant

## 2023-12-21 LAB — PSA: PSA: 1.05 ng/mL (ref 0.10–4.00)

## 2023-12-21 LAB — TSH: TSH: 1.87 u[IU]/mL (ref 0.35–5.50)

## 2024-01-02 ENCOUNTER — Encounter: Admitting: Physician Assistant

## 2024-01-09 ENCOUNTER — Telehealth: Payer: Self-pay | Admitting: Physician Assistant

## 2024-01-09 MED ORDER — AMLODIPINE BESYLATE 10 MG PO TABS: 10.0000 mg | ORAL_TABLET | Freq: Every day | ORAL | 1 refills | Status: AC

## 2024-01-09 NOTE — Telephone Encounter (Signed)
 Rx for Amlodipine  10 mg sent to pharmacy.

## 2024-01-09 NOTE — Telephone Encounter (Signed)
 Copied from CRM [id number]. Topic: Clinical - Medication Refill >> Jan 09, 2024  9:25 AM Rea C wrote: Medication: amLODipine  (NORVASC ) 5 MG tablet. Patient is requesting that since he's seen his doctor, if she can up the dosage to 10mg .   Has the patient contacted their pharmacy? Yes (Agent: If no, request that the patient contact the pharmacy for the refill. If patient does not wish to contact the pharmacy document the reason why and proceed with request.) (Agent: If yes, when and what did the pharmacy advise?)  This is the patient's preferred pharmacy:  [id number] - [place], [place] - [address] [place] [address] [place] [address] [place] [address] Phone: [phone] Fax: [phone]  Is this the correct pharmacy for this prescription? Yes If no, delete pharmacy and type the correct one.   Has the prescription been filled recently? Yes  Is the patient out of the medication? Patient has a few of the 5mg  left, but is taking two at a time and is requesting increase to 10mg .   Has the patient been seen for an appointment in the last year OR does the patient have an upcoming appointment? Yes  Can we respond through MyChart? Yes  Agent: Please be advised that Rx refills may take up to 3 business days. We ask that you follow-up with your pharmacy.

## 2024-01-09 NOTE — Telephone Encounter (Signed)
 Pt is requesting increase in dosage, see note

## 2024-01-18 ENCOUNTER — Encounter: Payer: Self-pay | Admitting: Physician Assistant

## 2024-01-18 ENCOUNTER — Ambulatory Visit: Admitting: Physician Assistant

## 2024-01-18 VITALS — BP 150/96 | HR 76 | Temp 97.3°F | Ht 73.0 in | Wt 256.0 lb

## 2024-01-18 DIAGNOSIS — I10 Essential (primary) hypertension: Secondary | ICD-10-CM | POA: Diagnosis not present

## 2024-01-18 DIAGNOSIS — Z0000 Encounter for general adult medical examination without abnormal findings: Secondary | ICD-10-CM

## 2024-01-18 DIAGNOSIS — E669 Obesity, unspecified: Secondary | ICD-10-CM

## 2024-01-18 NOTE — Progress Notes (Signed)
 Subjective:    Anthony Miller is a 68 y.o. male and is here for a comprehensive physical exam.  HPI  There are no preventive care reminders to display for this patient.  Discussed the use of AI scribe software for clinical note transcription with the patient, who gave verbal consent to proceed.  History of Present Illness   Anthony Miller is a 68 year old male who presents for Comprehensive Physical Exam (CPE) preventive care annual visit.  His blood pressure has been mostly stable with occasional fluctuations. He recently changed from amlodipine  10 mg dose daily after last visit, with overall symptom improvement, though he still has occasional tinnitus. He denies leg swelling, numbness, tingling, tremors, diarrhea, or constipation. He wakes once at night to urinate, which he links to high daytime water intake.  He plans to restart the DASH diet and aims to lose about 20 pounds. He has not been exercising since the end of summer due to a busy schedule and family commitments but was previously more active. He denies increased alcohol use. His job is stressful, which he feels contributes to his overall stress.  His wife is undergoing treatment for breast cancer with prior lumpectomy and upcoming radiation. His mother also had breast cancer. His mother-in-law lives with him due to health issues and home repairs, which adds to his caregiving burden and stress.     Health Maintenance: Immunizations -- UpToDate  Colonoscopy -- UpToDate - due in 2027 PSA --  Lab Results  Component Value Date   PSA 1.05 12/20/2023   PSA 1.49 12/29/2022   PSA 0.9 10/22/2015   Diet -- working on healthier diet Sleep habits -- no major concerns Exercise -- none currently  Weight -- Weight: 256 lb (116.1 kg)  Recent weight history Wt Readings from Last 10 Encounters:  01/18/24 256 lb (116.1 kg)  12/20/23 252 lb 8 oz (114.5 kg)  12/29/22 249 lb (112.9 kg)  10/28/22 247 lb 8 oz (112.3 kg)   08/05/22 251 lb 3.2 oz (113.9 kg)  06/08/21 248 lb 8 oz (112.7 kg)  01/11/21 263 lb (119.3 kg)  07/19/19 250 lb 4 oz (113.5 kg)  06/04/19 240 lb (108.9 kg)  03/05/19 257 lb (116.6 kg)   Body mass index is 33.78 kg/m.  Mood -- stable Alcohol use --  reports current alcohol use of about 2.0 standard drinks of alcohol per week.  Tobacco use --  Tobacco Use: Medium Risk (01/18/2024)   Patient History    Smoking Tobacco Use: Never    Smokeless Tobacco Use: Former    Passive Exposure: Not on file    Eligible for Low Dose CT? no  UTD with eye doctor? yes UTD with dentist? yes     01/18/2024   10:52 AM  Depression screen PHQ 2/9  Decreased Interest 0  Down, Depressed, Hopeless 0  PHQ - 2 Score 0  Altered sleeping 0  Tired, decreased energy 0  Change in appetite 0  Feeling bad or failure about yourself  0  Trouble concentrating 0  Moving slowly or fidgety/restless 0  Suicidal thoughts 0  PHQ-9 Score 0  Difficult doing work/chores Not difficult at all    Other providers/specialists: Patient Care Team: Job Lukes, [place] as PCP - General (Physician Assistant)    PMHx, SurgHx, SocialHx, Medications, and Allergies were reviewed in the Visit Navigator and updated as appropriate.   Past Medical History:  Diagnosis Date   Allergy    Arthritis  lower back/pelvic region (06/21/2013)   GERD (gastroesophageal reflux disease)    Hiatal hernia    Hypertension    IBS (irritable bowel syndrome)    Pneumonia 1970's; ~ 2010   Status post dilation of esophageal narrowing      Past Surgical History:  Procedure Laterality Date   LAPAROSCOPIC CHOLECYSTECTOMY  ~ 1999   LUMBAR LAMINECTOMY/DECOMPRESSION MICRODISCECTOMY N/A 06/26/2013   Procedure: LUMBAR LAMINECTOMY/DECOMPRESSION MICRODISCECTOMY;  Surgeon: Oneil Rodgers Priestly, MD;  Location: [facility] OR;  Service: Orthopedics;  Laterality: N/A;   SPINE SURGERY     TONSILLECTOMY  02/14/1973     Family History  Problem Relation  Age of Onset   Breast cancer Mother    Parkinson's disease Mother    Alzheimer's disease Father    Subarachnoid hemorrhage Father    Pancreatic cancer Paternal Grandmother     Social History   Tobacco Use   Smoking status: Never   Smokeless tobacco: Former    Types: Chew    Quit date: 10/20/1983   Tobacco comments:    quit chewing in the 1980's  Substance Use Topics   Alcohol use: Yes    Alcohol/week: 2.0 standard drinks of alcohol    Types: 2 Shots of liquor per week   Drug use: Never    Review of Systems:   Review of Systems  Constitutional:  Negative for chills, fever, malaise/fatigue and weight loss.  HENT:  Negative for hearing loss, sinus pain and sore throat.   Respiratory:  Negative for cough and hemoptysis.   Cardiovascular:  Negative for chest pain, palpitations, leg swelling and PND.  Gastrointestinal:  Negative for abdominal pain, constipation, diarrhea, heartburn, nausea and vomiting.  Genitourinary:  Negative for dysuria, frequency and urgency.  Musculoskeletal:  Negative for back pain, myalgias and neck pain.  Skin:  Negative for itching and rash.  Neurological:  Negative for dizziness, tingling, seizures and headaches.  Endo/Heme/Allergies:  Negative for polydipsia.  Psychiatric/Behavioral:  Negative for depression. The patient is not nervous/anxious.     Objective:    Vitals:   01/18/24 1022 01/18/24 1052  BP: (!) 140/98 (!) 150/96  Pulse: 76   Temp: (!) 97.3 F (36.3 C)   SpO2: 98%     Body mass index is 33.78 kg/m.  General  Alert, cooperative, no distress, appears stated age  Head:  Normocephalic, without obvious abnormality, atraumatic  Eyes:  PERRL, conjunctiva/corneas clear, EOM's intact, fundi benign, both eyes       Ears:  Normal TM's and external ear canals, both ears  Nose: Nares normal, septum midline, mucosa normal, no drainage or sinus tenderness  Throat: Lips, mucosa, and tongue normal; teeth and gums normal  Neck: Supple,  symmetrical, trachea midline, no adenopathy;     thyroid:  No enlargement/tenderness/nodules; no carotid bruit or JVD  Back:   Symmetric, no curvature, ROM normal, no CVA tenderness  Lungs:   Clear to auscultation bilaterally, respirations unlabored  Chest wall:  No tenderness or deformity  Heart:  Regular rate and rhythm, S1 and S2 normal, no murmur, rub or gallop  Abdomen:   Soft, non-tender, bowel sounds active all four quadrants, no masses, no organomegaly  Extremities: Extremities normal, atraumatic, no cyanosis or edema  Prostate : Deferred   Skin: Skin color, texture, turgor normal, no rashes or lesions  Lymph nodes: Cervical, supraclavicular, and axillary nodes normal  Neurologic: CNII-XII grossly intact. Normal strength, sensation and reflexes throughout   AssessmentPlan:   Assessment and Plan    Comprehensive  Physical Exam (CPE) preventive care annual visit  Colonoscopy up to date, PSA normal, no urinary or dermatological issues. Engages in physical activity. - Continue regular dental and eye check-ups. - Implement DASH diet. - Encouraged weight loss and increased physical activity.   Essential hypertension Goal is 130/80 mmHg and log does not show much improvement however symptom(s) have improved. - Continue amlodipine  10 mg daily - I have recommended adding additional agent (likely hydrochloroTHIAZIDE) -- he declined - Monitor blood pressure regularly.  Obesity, unspecified class, unspecified obesity type, unspecified whether serious comorbidity present Continue efforts and lowering portions and moving body  Follow up in 3-6 month(s), sooner if concerns  Lucie Buttner, PA-C [facility]

## 2024-01-28 DIAGNOSIS — R079 Chest pain, unspecified: Secondary | ICD-10-CM | POA: Diagnosis not present

## 2024-01-28 DIAGNOSIS — I498 Other specified cardiac arrhythmias: Secondary | ICD-10-CM | POA: Diagnosis not present

## 2024-01-28 DIAGNOSIS — F419 Anxiety disorder, unspecified: Secondary | ICD-10-CM | POA: Diagnosis not present

## 2024-01-28 DIAGNOSIS — R450 Nervousness: Secondary | ICD-10-CM | POA: Diagnosis not present

## 2024-01-28 DIAGNOSIS — H9313 Tinnitus, bilateral: Secondary | ICD-10-CM | POA: Diagnosis not present

## 2024-01-28 DIAGNOSIS — R1013 Epigastric pain: Secondary | ICD-10-CM | POA: Diagnosis not present

## 2024-01-28 DIAGNOSIS — Z79899 Other long term (current) drug therapy: Secondary | ICD-10-CM | POA: Diagnosis not present

## 2024-01-28 DIAGNOSIS — I10 Essential (primary) hypertension: Secondary | ICD-10-CM | POA: Diagnosis not present

## 2024-01-28 DIAGNOSIS — R110 Nausea: Secondary | ICD-10-CM | POA: Diagnosis not present

## 2024-01-28 DIAGNOSIS — R072 Precordial pain: Secondary | ICD-10-CM | POA: Diagnosis not present

## 2024-01-28 DIAGNOSIS — T43016A Underdosing of tricyclic antidepressants, initial encounter: Secondary | ICD-10-CM | POA: Diagnosis not present

## 2024-01-29 ENCOUNTER — Telehealth: Payer: Self-pay

## 2024-01-29 NOTE — Telephone Encounter (Signed)
 Transition Care Management Unsuccessful Follow-up Telephone Call  Date of discharge and from where:  01/28/24; [facility]   Attempts:  1st Attempt  Reason for unsuccessful TCM follow-up call:  Left voice message; LVM for patient to complete TOC call and schedule for ED follow up with PCP. Advised to call our office to schedule this appointment. If pt returns call please schedule pt accordingly.

## 2024-02-02 ENCOUNTER — Encounter: Payer: Self-pay | Admitting: Physician Assistant

## 2024-02-02 ENCOUNTER — Ambulatory Visit (INDEPENDENT_AMBULATORY_CARE_PROVIDER_SITE_OTHER): Admitting: Physician Assistant

## 2024-02-02 VITALS — BP 138/88 | HR 81 | Temp 98.0°F | Ht 73.0 in | Wt 258.5 lb

## 2024-02-02 DIAGNOSIS — I10 Essential (primary) hypertension: Secondary | ICD-10-CM | POA: Diagnosis not present

## 2024-02-02 DIAGNOSIS — T50905A Adverse effect of unspecified drugs, medicaments and biological substances, initial encounter: Secondary | ICD-10-CM | POA: Diagnosis not present

## 2024-02-02 DIAGNOSIS — T50905S Adverse effect of unspecified drugs, medicaments and biological substances, sequela: Secondary | ICD-10-CM

## 2024-02-02 DIAGNOSIS — R29818 Other symptoms and signs involving the nervous system: Secondary | ICD-10-CM

## 2024-02-02 MED ORDER — AMITRIPTYLINE HCL 50 MG PO TABS: 50.0000 mg | ORAL_TABLET | Freq: Every day | ORAL | 1 refills | Status: AC

## 2024-02-02 NOTE — Progress Notes (Signed)
 "  History of Present Illness:   Chief Complaint  Patient presents with   Follow-up    Pt was seen in the ED on 12/14 for Hypertension   Discussed the use of AI scribe software for clinical note transcription with the patient, who gave verbal consent to proceed.  History of Present Illness   Anthony Miller is a 68 year old male who presents for follow-up after a recent episode of elevated blood pressure and symptoms suggestive of a possible adverse reaction to medication.  He was well until last Saturday when he developed weakness, fatigue, chest pain, and increased tinnitus, with home blood pressure of 155/101. Urgent care obtained a normal EKG and referred him to the hospital for further evaluation. At the hospital he was monitored for 6 to 7 hours with serial EKGs and cardiac enzymes, all normal, and was discharged without a cardiac diagnosis. He links the episode to a missed 50 mg dose of nightly amitriptyline  for sleep due to a delayed refill, after which his symptoms and blood pressure improved once he resumed the medication. He received IV medication for nausea in the hospital and currently feels well, with no ongoing chest pain or sleep difficulty. He is physically active with daily outdoor activity and typically wakes once at night.     He does have suspected obstructive sleep apnea and is ready to pursue evaluation for this.  He snores and has daytime fatigue.  His wife has concerns that she can hear him stop breathing at night.  He is taking his amlodipine  10 mg daily and tolerating well.   Past Medical History:  Diagnosis Date   Allergy    Arthritis    lower back/pelvic region (06/21/2013)   GERD (gastroesophageal reflux disease)    Hiatal hernia    Hypertension    IBS (irritable bowel syndrome)    Pneumonia 1970's; ~ 2010   Status post dilation of esophageal narrowing      Social History[1]  Past Surgical History:  Procedure Laterality Date   LAPAROSCOPIC  CHOLECYSTECTOMY  ~ 1999   LUMBAR LAMINECTOMY/DECOMPRESSION MICRODISCECTOMY N/A 06/26/2013   Procedure: LUMBAR LAMINECTOMY/DECOMPRESSION MICRODISCECTOMY;  Surgeon: Oneil Rodgers Priestly, MD;  Location: [facility] OR;  Service: Orthopedics;  Laterality: N/A;   SPINE SURGERY     TONSILLECTOMY  02/14/1973    Family History  Problem Relation Age of Onset   Breast cancer Mother    Parkinson's disease Mother    Alzheimer's disease Father    Subarachnoid hemorrhage Father    Pancreatic cancer Paternal Grandmother     Allergies[2]  Current Medications:  Current Medications[3]   Review of Systems:   Negative unless otherwise specified per HPI.  Vitals:   Vitals:   02/02/24 1032 02/02/24 1103  BP: (!) 150/86 138/88  Pulse: 81   Temp: 98 F (36.7 C)   TempSrc: Temporal   SpO2: 98%   Weight: 258 lb 8 oz (117.3 kg)   Height: 6' 1 (1.854 m)      Body mass index is 34.1 kg/m.  Physical Exam:   Physical Exam Vitals and nursing note reviewed.  Constitutional:      General: He is not in acute distress.    Appearance: He is well-developed. He is not ill-appearing or toxic-appearing.  Cardiovascular:     Rate and Rhythm: Normal rate and regular rhythm.     Pulses: Normal pulses.     Heart sounds: Normal heart sounds, S1 normal and S2 normal.  Pulmonary:  Effort: Pulmonary effort is normal.     Breath sounds: Normal breath sounds.  Skin:    General: Skin is warm and dry.  Neurological:     Mental Status: He is alert.     GCS: GCS eye subscore is 4. GCS verbal subscore is 5. GCS motor subscore is 6.  Psychiatric:        Speech: Speech normal.        Behavior: Behavior normal. Behavior is cooperative.     Assessment and Plan:   Assessment and Plan    Medication reaction, sequela Symptoms resolved after resuming amitriptyline . No acute cardiac issues on evaluation. - Refilled amitriptyline  prescription. - Denies any need for evaluation from other specialist such as  cardiologist for symptoms since they have resolved, he will reach out if symptoms return  Suspected sleep apnea Discussed home sleep test option. Primary care not typically involved in ordering due to unfamiliarity with devices and treatments. - Referred to a specialist for sleep study evaluation.     Primary hypertension Currently well-controlled on recheck Continue amlodipine  10 mg daily Follow-up as regularly scheduled for chronic medical visits     Lucie Buttner, PA-C     [1]  Social History Tobacco Use   Smoking status: Never   Smokeless tobacco: Former    Types: Chew    Quit date: 10/20/1983   Tobacco comments:    quit chewing in the 1980's  Substance Use Topics   Alcohol use: Yes    Alcohol/week: 2.0 standard drinks of alcohol    Types: 2 Shots of liquor per week   Drug use: Never  [2]  Allergies Allergen Reactions   Morphine  And Codeine Rash  [3]  Current Outpatient Medications:    albuterol  (VENTOLIN  HFA) 108 (90 Base) MCG/ACT inhaler, Inhale 2 puffs into the lungs every 6 (six) hours as needed for wheezing or shortness of breath., Disp: 18 g, Rfl: 1   amLODipine  (NORVASC ) 10 MG tablet, Take 1 tablet (10 mg total) by mouth daily., Disp: 90 tablet, Rfl: 1   omeprazole  (PRILOSEC) 20 MG capsule, Take 1 capsule (20 mg total) by mouth daily., Disp: 30 capsule, Rfl: 3   amitriptyline  (ELAVIL ) 50 MG tablet, Take 1 tablet (50 mg total) by mouth at bedtime., Disp: 90 tablet, Rfl: 1  "

## 2024-04-04 ENCOUNTER — Ambulatory Visit: Admitting: Primary Care

## 2025-01-20 ENCOUNTER — Encounter: Admitting: Physician Assistant
